# Patient Record
Sex: Male | Born: 1965 | Race: Black or African American | Hispanic: No | Marital: Single | Smoking: Former smoker
Health system: Southern US, Community
[De-identification: ages and names within clinical notes are randomized; demographics above are authoritative.]

## PROBLEM LIST (undated history)

## (undated) DIAGNOSIS — E119 Type 2 diabetes mellitus without complications: Secondary | ICD-10-CM

## (undated) DIAGNOSIS — R519 Headache, unspecified: Secondary | ICD-10-CM

## (undated) DIAGNOSIS — J45909 Unspecified asthma, uncomplicated: Secondary | ICD-10-CM

## (undated) DIAGNOSIS — E7800 Pure hypercholesterolemia, unspecified: Secondary | ICD-10-CM

## (undated) DIAGNOSIS — J40 Bronchitis, not specified as acute or chronic: Secondary | ICD-10-CM

## (undated) DIAGNOSIS — I2510 Atherosclerotic heart disease of native coronary artery without angina pectoris: Secondary | ICD-10-CM

## (undated) DIAGNOSIS — I10 Essential (primary) hypertension: Secondary | ICD-10-CM

## (undated) DIAGNOSIS — R51 Headache: Secondary | ICD-10-CM

## (undated) DIAGNOSIS — J449 Chronic obstructive pulmonary disease, unspecified: Secondary | ICD-10-CM

## (undated) HISTORY — PX: NO PAST SURGERIES: SHX2092

---

## 2002-04-18 ENCOUNTER — Ambulatory Visit (HOSPITAL_COMMUNITY): Admission: RE | Admit: 2002-04-18 | Discharge: 2002-04-18 | Payer: Self-pay | Admitting: Otolaryngology

## 2002-04-18 ENCOUNTER — Encounter: Payer: Self-pay | Admitting: Otolaryngology

## 2002-09-17 ENCOUNTER — Emergency Department (HOSPITAL_COMMUNITY): Admission: EM | Admit: 2002-09-17 | Discharge: 2002-09-18 | Payer: Self-pay | Admitting: Emergency Medicine

## 2002-09-17 ENCOUNTER — Encounter: Payer: Self-pay | Admitting: Emergency Medicine

## 2002-09-18 ENCOUNTER — Encounter: Payer: Self-pay | Admitting: Emergency Medicine

## 2003-09-29 ENCOUNTER — Inpatient Hospital Stay (HOSPITAL_COMMUNITY): Admission: EM | Admit: 2003-09-29 | Discharge: 2003-10-03 | Payer: Self-pay | Admitting: Emergency Medicine

## 2005-10-27 ENCOUNTER — Emergency Department: Payer: Self-pay | Admitting: Emergency Medicine

## 2006-01-12 ENCOUNTER — Ambulatory Visit: Payer: Self-pay | Admitting: Specialist

## 2006-01-26 ENCOUNTER — Ambulatory Visit: Payer: Self-pay | Admitting: Specialist

## 2006-04-04 ENCOUNTER — Encounter: Payer: Self-pay | Admitting: Specialist

## 2006-04-05 ENCOUNTER — Ambulatory Visit: Admission: RE | Admit: 2006-04-05 | Discharge: 2006-04-05 | Payer: Self-pay | Admitting: *Deleted

## 2006-04-05 ENCOUNTER — Ambulatory Visit: Payer: Self-pay | Admitting: Pulmonary Disease

## 2006-04-11 ENCOUNTER — Emergency Department: Payer: Self-pay | Admitting: Emergency Medicine

## 2006-04-24 ENCOUNTER — Encounter: Payer: Self-pay | Admitting: Specialist

## 2009-03-17 ENCOUNTER — Emergency Department (HOSPITAL_COMMUNITY): Admission: EM | Admit: 2009-03-17 | Discharge: 2009-03-17 | Payer: Self-pay | Admitting: Emergency Medicine

## 2011-02-02 LAB — BASIC METABOLIC PANEL
BUN: 5 mg/dL — ABNORMAL LOW (ref 6–23)
CO2: 29 mEq/L (ref 19–32)
Calcium: 8.8 mg/dL (ref 8.4–10.5)
Chloride: 103 mEq/L (ref 96–112)
Creatinine, Ser: 0.95 mg/dL (ref 0.4–1.5)
GFR calc Af Amer: >60 mL/min (ref >60)
GFR calc non Af Amer: >60 mL/min (ref >60)
Glucose, Bld: 167 mg/dL — ABNORMAL HIGH (ref 70–99)
Potassium: 2.8 mEq/L — ABNORMAL LOW (ref 3.5–5.1)
Sodium: 139 mEq/L (ref 135–145)

## 2011-02-02 LAB — POCT CARDIAC MARKERS
CKMB, poc: <1 ng/mL — ABNORMAL LOW (ref 1.0–8.0)
CKMB, poc: <1 ng/mL — ABNORMAL LOW (ref 1.0–8.0)
Myoglobin, poc: 37.5 ng/mL (ref 12–200)
Myoglobin, poc: 37.6 ng/mL (ref 12–200)
Troponin i, poc: <0.05 ng/mL (ref 0.00–0.09)

## 2011-02-02 LAB — CBC
HCT: 39.3 % (ref 39.0–52.0)
Hemoglobin: 13.8 g/dL (ref 13.0–17.0)
MCV: 92.3 fL (ref 78.0–100.0)
RBC: 4.26 MIL/uL (ref 4.22–5.81)
WBC: 5.2 10*3/uL (ref 4.0–10.5)

## 2011-03-12 NOTE — Procedures (Signed)
NAME:  Cody Shepard, Cody Shepard NO.:  [id number]   MEDICAL RECORD NO.:  [id number]                   PATIENT TYPE:  INP   LOCATION:  A222                                 FACILITY:  [facility]   PHYSICIAN:  Vida Roller, M.D.                DATE OF BIRTH:  Jun 14, 1966   DATE OF PROCEDURE:  09/30/2003  DATE OF DISCHARGE:                                    STRESS TEST   TAPE NUMBER:  SE - [id number]   TAPE COUNT:  2204 - [id number]   INDICATIONS FOR PROCEDURE:  This is a 45 year old guy with chest pain, for  evaluation. Multiple cardiac risk factors of hypertension, diabetes, and  hyperlipidemia as well as active tobacco abuse who presents with chest  discomfort, ruled out for cardiac enzymes and now presents for evaluation  for the chest discomfort.   DESCRIPTION OF PROCEDURE:  The patient was brought to the echocardiographic  lab where he was placed in the left lateral decubitus position.  Echocardiographic images were obtained in the parasternal long axes,  parasternal short axes, apical 4 and apical 2 chamber views. The patient was  then exercised in a Bruce protocol and when he had attained at least 85% of  his maximum predicted heart rate for his age, he was brought back  immediately to the echocardiographic suite, where echocardiographic images  were obtained in the same views. These views were then compared, stress  versus rest, for wall motion abnormalities.   STRESS TEST RESULTS:  The patient exercised 7 minutes and 15 seconds of a  Bruce protocol, attaining of 90% of his maximum predicted heart rate for his  age and 10 minutes of exercise. His heart rate increased from 108 beats per  minute to 163 beats per minute, which is 90% of his maximum predicted heart  rate. His blood pressure went from 102 over 62 to 168 over 72, giving a  double product of 24,000. He stopped exercising secondary to shortness of  breath and fatigue. He had no STT wave changes concerning for  ischemia. Had  a few premature ventricular contractions.   ECHOCARDIOGRAPHIC RESULTS:  Rest images show normal left ventricular size  and systolic function with no wall motion abnormalities. Stress images  reveal normal left ventricular size with appropriate recruitment of systolic  function. There is, however, evidence of inferolateral hypokinesis, which is  relatively severe and is reproduced on multiple images, consistent with  inferolateral ischemia.   RECOMMENDATIONS:  Invasive testing should be performed.      ___________________________________________                                            Vida Roller, M.D.   JH/MEDQ  D:  09/30/2003  T:  09/30/2003  Job:  [id number]  cc:   Hanley Hays. Dechurch, M.D.  [address]  [place]  [place] [address]  Fax: [phone]

## 2011-03-12 NOTE — Consult Note (Signed)
NAME:  Cody Shepard, Cody Shepard NO.:  [id number]   MEDICAL RECORD NO.:  [id number]                   PATIENT TYPE:  INP   LOCATION:  A222                                 FACILITY:  [facility]   PHYSICIAN:  Vida Roller, M.D.                DATE OF BIRTH:  [birth year]   DATE OF CONSULTATION:  DATE OF DISCHARGE:                                   CONSULTATION   HISTORY OF PRESENT ILLNESS:  Cody Shepard is a 45 year old African American  male who has a history of hypertension, hyperlipidemia and diabetes mellitus  who presents to the emergency department complaining of left anterior chest  pain at about a 5-10 minute episode localized to the left chest radiating  towards the shoulder.  No associated shortness of breath or diaphoresis.  He  presented to the ER and was given three sublingual nitroglycerins.  The pain  resolved.  There was one episode of recurrent chest pain after admission  which responded to sublingual nitroglycerin and morphine sulfate.   PAST MEDICAL HISTORY:  1. Hypertension.  2. Diabetes mellitus which is diet and medication controlled.  3. Hyperlipidemia.  4. History of enlarged heart and was evaluated by cardiologist in     [place], [place] a number of years ago.  He had a stress test     at that time which was reported as normal.  We have no records of that     evaluation.   MEDICATIONS:  1. Aciphex 20 mg daily.  2. Lipitor 10 mg daily.  3. Allegra D.  4. Glucophage 500 mg twice a day.  5. In the hospital he is on:     A. Aspirin.     B. Imdur 30 mg daily.     C. Protonix 40 mg daily.     D. Zocor 20 mg q.h.s.     E. Normal saline infusion.   SOCIAL HISTORY:  He lives in [place] with his friends.  He is a  heavy Arboriculturist.  He is single.  He has one child on the way.  He  previously smoked cigarettes but quit eight years ago.  Does not drink any  alcohol.   FAMILY HISTORY:  Mother is alive and well at age 75  with no coronary  disease.  Father died at age 39 of a stroke.  He has one sister who is  healthy and about his age.   REVIEW OF SYSTEMS:  Otherwise, noncontributory.   ALLERGIES:  Not allergic to any medication and does not have a problem with  contrast.   PHYSICAL EXAMINATION:  GENERAL APPEARANCE:  Well-developed, well-nourished  African American male in no apparent distress.  Alert and oriented times  four.  VITAL SIGNS:  Pulse 82, respirations 20, blood pressure 112/78.  HEENT:  Unremarkable.  NECK:  Supple with no jugular venous distention or carotid bruits.  CHEST:  Clear to auscultation.  CARDIAC:  Nondisplaced point of maximum impulse with no lifts or thrills.  First and second heart sound are normal.  There is no third or fourth heart  sound.  There is a 2/6 holosystolic murmur heard at the left sternal border.  LUNGS:  Clear to auscultation.  ABDOMEN:  Soft, nontender, normoactive bowel sounds.  GU/RECTAL:  Deferred.  EXTREMITIES:  Without cyanosis, clubbing or edema.  He has 2+ pulses  throughout.  There are no bruits.  MUSCULOSKELETAL:  Without deformities.  NEUROLOGICAL:  Nonfocal.   CHEST X-RAY:  Chest x-ray is no acute disease.   ELECTROCARDIOGRAM:  Sinus rhythm with mild left axis deviation, normal  intervals.  No Q waves.  Nonspecific ST-T wave changes, but he has left  ventricular hypertrophy.   ECHOCARDIOGRAM:  A stress echocardiogram was performed which showed evidence  of inferior lateral ischemia with normal left ventricular systolic function.   LABORATORY DATA:  White blood cell count 5.3, H&H 14 and 44 with platelet  count of 292.  Sodium 137, potassium 3.6, chloride 106, bicarbonate 27, BUN  13, creatinine 1.8, blood sugar 117.  Liver function studies normal.  Albumin 3.6, total protein 6.7.  Two sets of cardiac enzymes are not  consistent with acute myocardial infarction.   IMPRESSION/PLAN:  So, I have a gentleman with atypical chest discomfort  but  an abnormal stress echocardiogram concerning for ischemia who has multiple  cardiac risk factors.  His enzymes are negative times two.  His blood  pressure is well controlled as well as his diabetes and hyperlipidemia at  this point.  I think it is probably reasonable to refer him down to [facility] for invasive assessment as I am concerned for significant  coronary artery disease, and I will arrange that via [facility].      ___________________________________________                                            Vida Roller, M.D.   JH/MEDQ  D:  09/30/2003  T:  10/01/2003  Job:  [id number]

## 2011-03-12 NOTE — H&P (Signed)
NAME:  Cody Shepard, MENKEN.:  [id number]   MEDICAL RECORD NO.:  [id number]                   PATIENT TYPE:  EMS   LOCATION:  ED                                   FACILITY:  [facility]   PHYSICIAN:  Melvyn Novas, MD        DATE OF BIRTH:  25-Oct-1966   DATE OF ADMISSION:  09/29/2003  DATE OF DISCHARGE:                                HISTORY & PHYSICAL   The patient is a 45 year old black male with a history of hyperlipidemia,  hypertension, and diabetes of 2 years.  He complains of feeling bad all week  which is why he came to the ER.  He had generalized headache. He also noted  some left pectoral localized pain which was nonexertional occurring mostly  at night.  The patient denies any orthopnea, PND, or any dyspnea on exertion  or diaphoresis.  The patient was given 3 sublingual nitroglycerin which  significantly relieved his left pectoral pain.  The patient has some slow  mentation and is a questionably reliable historian with multiple risk  factors for coronary disease; and is therefore admitted for functional  evaluation and possible Cardiolite scan.   PAST MEDICAL HISTORY:  Past medical history is significant for hypertension,  type 2 diabetes for 2 years and hyperlipidemia.   PAST SURGICAL HISTORY:  Past surgical history is unremarkable.   ALLERGIES:  No known allergies.   CURRENT MEDICATIONS:  1. Aciphex 20 a day.  2. Lipitor 10 a day.  3. Allegra D.   SOCIAL HISTORY:  He is a nonsmoker.  He has 1 child do in 2 months.  He has  a heavy Arboriculturist.   PHYSICAL EXAMINATION:  VITAL SIGNS:  Blood pressure is 114/72, pulse is 80  and regular.  Respiratory rate is 60.  He is afebrile.  HEENT:  Head normocephalic, atraumatic.  Eyes PERLA.  Extraocular movements  intact.  Sclerae are clear.  Conjunctivae pink.  NECK:  Neck shows no jugular venous distention, no carotid bruits, no  thyromegaly, no thyroid bruits.  Throat shows no  erythema, no exudates.  LUNGS:  Lungs are clear to A&P.  No rales, wheezes or rhonchi.  HEART:  Regular rhythm.  No murmurs, gallops, heaves, thrills or rubs.  ABDOMEN:  Soft, nontender.  Bowel sounds are normoactive without rebound,  masses or organomegaly.  EXTREMITIES:  No clubbing, cyanosis, or edema.  NEUROLOGIC:  Cranial nerves II-XII grossly intact.  Patient moves all 4  extremities.  Plantars are downgoing.   IMPRESSION:  1. This follows left pectoral pain atypical in nature.  2. Multiple risk factors including diabetes, hypertension, and     hyperlipidemia.  3. Slow mentation, questionable reliability of history.   PLAN:  The plan is to admit place him on antiplatelet therapy, aspirin,  empiric nitrates, Imdur 30 a day, get hemoglobin A1c, lipid profile, TSH and  cardiology consultation for possible Cardiolite scan due to his  multiple  risk factors and I will make further recommendations as the data base  expands.     ___________________________________________                                         Melvyn Novas, MD   RMD/MEDQ  D:  09/29/2003  T:  09/30/2003  Job:  [id number]

## 2011-03-12 NOTE — Op Note (Signed)
NAME:  Cody Shepard, Cody Shepard NO.:  [id number]   MEDICAL RECORD NO.:  [id number]          PATIENT TYPE:  OUT   LOCATION:  CARD                         [facility]AC[facility]:  [facility]   PHYSICIAN:  Oley Balm. Sung Amabile, M.D. [facility] OF BIRTH:  October 22, 1966   DATE OF PROCEDURE:  04/05/2006  DATE OF DISCHARGE:  04/05/2006                                 OPERATIVE REPORT   INDICATION FOR TESTING:  Exertional dyspnea.   PROCEDURE:  Cardiopulmonary stress testing was performed on a graded  treadmill.  Testing was stopped due to chest pain.  Effort was suboptimal.  At peak exercise, oxygen uptake was 1.43 liters per minute or 50% of  predicted maximum, indicating moderate exercise impairment.   At peak exercise, heart rate was 120 beats per minute or 67% of predicted  maximum, indicating that cardiovascular reserve remained.  Oxygen pulse was  low normal, possibly suggesting decreased stroke volume.  Blood pressure  response was abnormal, in that there was an excessive hypertensive response  early on during testing.  EKG tracings revealed no definite ischemic  changes.  There were no arrhythmias noted.   At peak exercise, minute ventilation was 42.7 liters per minute or 54% of  maximum voluntary ventilation, indicating that ventilatory reserve remained.  Gas exchange parameters revealed no abnormalities.  Baseline spirometry  revealed moderate flow limitation with a mixed obstruction and restriction  pattern.  I believe that obstruction probably predominates though lung  volumes were not performed to confirm this.  Postexercise spirometry  revealed no exercise-induced bronchospasm.   SUMMARY:  Suboptimal testing.  Moderate exercise impairment.  Testing was  stopped due to chest pain.  Decreased oxygen pulse and increased blood  pressure response suggest that his limitation is a cardiovascular  limitation.  No ischemic changes were noted on EKG but one must consider  ischemic heart disease with  this result if this has not already been  evaluated.  Also noted is moderate flow limitation on spirometry which  appears to be predominantly obstruction greater than restriction.  Evaluation could include a trial of bronchodilator therapy versus pulmonary  consultation.           ______________________________  Oley Balm Sung Amabile, M.D. [facility]     DBS/MEDQ  D:  04/25/2006  T:  04/25/2006  Job:  [id number]   cc:   Jeanella Flattery  Fax: [phone]   Cardiopulmonary Department

## 2011-03-12 NOTE — Cardiovascular Report (Signed)
NAME:  URBAN, NAVAL NO.:  [id number]   MEDICAL RECORD NO.:  [id number]                   PATIENT TYPE:  INP   LOCATION:  3731                                 FACILITY:  [facility]   PHYSICIAN:  Salvadore Farber, M.D.             DATE OF BIRTH:  10-27-1965   DATE OF PROCEDURE:  10/02/2003  DATE OF DISCHARGE:                              CARDIAC CATHETERIZATION   PROCEDURE:  Left heart catheterization, left ventriculography, coronary  angiography.   INDICATIONS:  Mr. Haile is a 45 year old gentleman with coronary artery  disease diagnosed at cardiac catheterization performed in [place] two  years ago.  He now presents with chest discomfort.  Dobutamine  echocardiogram demonstrated inducible inferolateral wall motion abnormality.  He was therefore referred for catheterization.   PROCEDURAL TECHNIQUE:  Informed consent was obtained.  Under 1% lidocaine  local anesthesia a 6-French sheath was placed in the right femoral artery  using the modified Seldinger technique.  Diagnostic angiography and  ventriculography were performed using JL4, JR4, and pigtail catheters.  The  patient tolerated the procedure well and was transferred to the holding room  in stable condition.  Sheaths are to be removed there.   COMPLICATIONS:  None.   FINDINGS:  1. LV 129/9/14.  EF 60% without regional wall motion abnormality.  2. No aortic stenosis or mitral regurgitation.  3. Left main:  Angiographically normal.  4. LAD:  The LAD is a very large vessel which wraps around the apex of the     heart and gives rise to a single large diagonal.  It is angiographically     normal.  5. Circumflex:  The circumflex is a moderate sized vessel giving rise to a     single large obtuse marginal.  The distal circumflex has a 20% stenosis.  6. RCA:  The RCA is a moderate sized, dominant vessel.  There is a 30%     stenosis in the mid vessel.   IMPRESSION/RECOMMENDATIONS:  The patient  has minimal coronary disease.  Suggest noncardiac etiology to his chest discomfort and a false positive  dobutamine echocardiogram.  Will continue preventative efforts with aspirin  and Statin.  Since he has nonobstructive coronary disease, will discontinue  the chronic nitrates with which he has been treated.                                               Salvadore Farber, M.D.    WED/MEDQ  D:  10/02/2003  T:  10/02/2003  Job:  [id number]   cc:   Vida Roller, M.D.  Fax: [phone]   Laveda Abbe

## 2011-03-12 NOTE — Discharge Summary (Signed)
NAME:  Cody Shepard, Cody Shepard NO.:  [id number]   MEDICAL RECORD NO.:  [id number]                   PATIENT TYPE:  INP   LOCATION:  3731                                 FACILITY:  [facility]   PHYSICIAN:  Vida Roller, M.D.                DATE OF BIRTH:  [birth year]   DATE OF ADMISSION:  10/01/2003  DATE OF DISCHARGE:  10/03/2003                                 DISCHARGE SUMMARY   DISCHARGE DIAGNOSES:  1. Known nonobstructive coronary artery disease.  2. Glucose intolerance.  3. Right groin hematoma stable.   HOSPITAL COURSE:  Cody Shepard is a 45 year old male patient who has been  feeling poorly over the past week prior to admission.  He began having left  anterior chest pain while at a funeral on the day prior to admission.  The  episode lasted 5 to 10 minutes at a time.  Eventually he did present to the  emergency room for evaluation at [facility].  His stress echo  revealed evidence of inferolateral ischemia and he was sent to [facility]  for cardiac catheterization.  His cath on October 02, 2003 revealed a  nonobstructive coronary artery disease with a 30% mid right lesion and a 28%  mid circumflex lesion.  At that point he needs to continue secondary  prevention with aspirin plus a statin.   Post cath he did have some right groin swelling and some tenderness.  We did  do an ultrasound.  There was no evidence of a pseudoaneurysm or AV fistula.  There was a small hematoma measuring 1.62 x 0.76 cm.  We did watch him  overnight and he was ambulating without problems and he was ready for  discharge to home the next day.   LABORATORY STUDIES:  Lab studies during his hospital stay included sodium  137, potassium 3.7, BUN 9, creatinine 1.1, glucose 117.  Hemoglobin 11.9,  hematocrit 35.2, white count 4.9, platelets 239.  Total cholesterol 133,  triglycerides 93, HDL 39, LDL 75.  Hemoglobin A1C was 6.5 indicating glucose  intolerance.  Apparently the  patient is on Metformin at home and I have  asked him to stop this until Saturday, October 05, 2003.   He is to resume his home medications which include Aciphex 20 mg a day,  Lipitor 10 mg a day, Allegra daily, and Glucophage as instructed.  May use  Tylenol one to two tablets every six hours if needed for pain.  No strenuous  activity, or driving, or heavy lifting until two days and then he will  gradually increase his activity, clean over his cath site with soap and  water, no scrubbing.  Call for questions or concerns.  He is to follow up at  Dr. Marchelle Folks office on October 11, 2003 at 1:45 p.m.  At this point, he  will need to have a groin check and to have a  CBC to assure that his  hemoglobin has remained stable.      Guy Franco, P.A. [facility]                      Vida Roller, M.D.    LB/MEDQ  D:  10/03/2003  T:  10/04/2003  Job:  [id number]   cc:   [facility], [place]

## 2011-03-12 NOTE — Procedures (Signed)
NAME:  Cody Shepard, Cody Shepard NO.:  [id number]   MEDICAL RECORD NO.:  [id number]                   PATIENT TYPE:  INP   LOCATION:  A222                                 FACILITY:  [facility]   PHYSICIAN:  Vida Roller, M.D.                DATE OF BIRTH:  June 18, 1966   DATE OF PROCEDURE:  DATE OF DISCHARGE:                                    STRESS TEST   INDICATION:  The patient is a 45 year old male with no known coronary artery  disease who presented with atypical chest discomfort.   CARDIAC RISK FACTORS:  1. Hypertension.  2. Diabetes.  3. Hyperlipidemia.  4. History of tobacco abuse.   His cardiac enzymes were negative x2 for acute myocardial infarction.   BASELINE DATA:  Initial echocardiogram is pending M.D. review.   Baseline EKG revealed sinus rhythm at 70 beats per minute with nonspecific  ST abnormalities.  Blood pressure is 102/62.   The patient exercised for a total of 7 minutes, 15 seconds to Bruce protocol  stage 3, and 10.1 mets.  Maximum heart rate was 163 beats per minute which  is 90% of predicted maximum.  Maximum blood pressure was 168/72.  The  patient reported some shortness of breath at the end of exercise.  However,  no chest pain was noted.  EKG revealed no ischemic changes, and few PVC's.  The test was stopped secondary to fatigue.   Final echocardiogram was performed.  The results of this are pending M.D.  review.     ________________________________________  ___________________________________________  Jae Dire, P.A. [facility]                      Vida Roller, M.D.   AB/MEDQ  D:  09/30/2003  T:  10/01/2003  Job:  [id number]

## 2013-01-04 ENCOUNTER — Emergency Department (HOSPITAL_COMMUNITY): Payer: Medicare Other

## 2013-01-04 ENCOUNTER — Encounter (HOSPITAL_COMMUNITY): Payer: Self-pay | Admitting: Emergency Medicine

## 2013-01-04 ENCOUNTER — Emergency Department (HOSPITAL_COMMUNITY)
Admission: EM | Admit: 2013-01-04 | Discharge: 2013-01-05 | Disposition: A | Discharge status: Home / Self Care | Payer: Medicare Other | Attending: Emergency Medicine | Admitting: Emergency Medicine

## 2013-01-04 DIAGNOSIS — I10 Essential (primary) hypertension: Secondary | ICD-10-CM | POA: Insufficient documentation

## 2013-01-04 DIAGNOSIS — Z87891 Personal history of nicotine dependence: Secondary | ICD-10-CM | POA: Insufficient documentation

## 2013-01-04 DIAGNOSIS — R1110 Vomiting, unspecified: Secondary | ICD-10-CM | POA: Insufficient documentation

## 2013-01-04 DIAGNOSIS — Z7902 Long term (current) use of antithrombotics/antiplatelets: Secondary | ICD-10-CM | POA: Insufficient documentation

## 2013-01-04 DIAGNOSIS — E119 Type 2 diabetes mellitus without complications: Secondary | ICD-10-CM | POA: Insufficient documentation

## 2013-01-04 DIAGNOSIS — IMO0002 Reserved for concepts with insufficient information to code with codable children: Secondary | ICD-10-CM | POA: Insufficient documentation

## 2013-01-04 DIAGNOSIS — J4489 Other specified chronic obstructive pulmonary disease: Secondary | ICD-10-CM | POA: Insufficient documentation

## 2013-01-04 DIAGNOSIS — R51 Headache: Secondary | ICD-10-CM | POA: Insufficient documentation

## 2013-01-04 DIAGNOSIS — I2510 Atherosclerotic heart disease of native coronary artery without angina pectoris: Secondary | ICD-10-CM | POA: Insufficient documentation

## 2013-01-04 DIAGNOSIS — E7800 Pure hypercholesterolemia, unspecified: Secondary | ICD-10-CM | POA: Insufficient documentation

## 2013-01-04 DIAGNOSIS — R0602 Shortness of breath: Secondary | ICD-10-CM | POA: Insufficient documentation

## 2013-01-04 DIAGNOSIS — Z7982 Long term (current) use of aspirin: Secondary | ICD-10-CM | POA: Insufficient documentation

## 2013-01-04 DIAGNOSIS — R0789 Other chest pain: Secondary | ICD-10-CM | POA: Insufficient documentation

## 2013-01-04 DIAGNOSIS — Z79899 Other long term (current) drug therapy: Secondary | ICD-10-CM | POA: Insufficient documentation

## 2013-01-04 HISTORY — DX: Type 2 diabetes mellitus without complications: E11.9

## 2013-01-04 HISTORY — DX: Unspecified asthma, uncomplicated: J45.909

## 2013-01-04 HISTORY — DX: Bronchitis, not specified as acute or chronic: J40

## 2013-01-04 HISTORY — DX: Chronic obstructive pulmonary disease, unspecified: J44.9

## 2013-01-04 HISTORY — DX: Atherosclerotic heart disease of native coronary artery without angina pectoris: I25.10

## 2013-01-04 HISTORY — DX: Pure hypercholesterolemia, unspecified: E78.00

## 2013-01-04 HISTORY — DX: Essential (primary) hypertension: I10

## 2013-01-04 MED ORDER — SODIUM CHLORIDE 0.9 % IV SOLN
Freq: Once | INTRAVENOUS | Status: AC
  Administered 2013-01-04: 1000 mL via INTRAVENOUS

## 2013-01-04 MED ORDER — METOCLOPRAMIDE HCL 5 MG/ML IJ SOLN
10.0000 mg | Freq: Once | INTRAMUSCULAR | Status: AC
  Administered 2013-01-04: 10 mg via INTRAVENOUS
  Filled 2013-01-04: qty 2

## 2013-01-04 MED ORDER — DIPHENHYDRAMINE HCL 50 MG/ML IJ SOLN
25.0000 mg | Freq: Once | INTRAMUSCULAR | Status: AC
  Administered 2013-01-04: 25 mg via INTRAVENOUS
  Filled 2013-01-04: qty 1

## 2013-01-04 NOTE — ED Notes (Signed)
Patient c/o headache since Sunday.  Patient was seen by PMD yesterday and was given a shot and states he doesn't feel any better. Patient states he vomited yesterday, but not since then.

## 2013-01-04 NOTE — ED Provider Notes (Signed)
History    This chart was scribed for Ward Givens, MD by Leone Payor, ED Scribe. This patient was seen in room APA03/AP[facility]03 and the patient's care was started 10:08 PM.   CSN: [id number]  Arrival date & time 01/04/13  1927   First MD Initiated Contact with Patient 01/04/13 2200      Chief Complaint  Patient presents with  . Headache     The history is provided by the patient and the spouse. No language interpreter was used.    Cody Shepard is a 47 y.o. male who presents to the Emergency Department complaining of an ongoing, all over HA beginning 4 days ago. States he woke up in the morning and had a mild headache that got progressively worse. Pt describes the pain as sharp and feels like a hammer pounding. Pt was seen by PCP yesterday and was given an injection and his HA went away but returned last night at midnight about 8 hours after the injection. He has associated vomiting (x1 yesterday), SOB, off and on chest pain to the center chest that lasts 5-10 minutes with no triggers that he's had for years. Patient states his cardiologist in [place] is aware of the pain and told him to exercise to make the pain go away. He denies blurred, double vision, neck pain, past similar HAs, cough, sneezing, runny nose. Pt has a family h/o HAs in his sister.   Pt has electric heat. Denies any recent stress.   Pt has h/o HTN, CHF, COPD, asthma, DM.  PCP [facility].    Past Medical History  Diagnosis Date  . Hypertension   . Coronary artery disease   . High cholesterol   . COPD (chronic obstructive pulmonary disease)   . Asthma   . Bronchitis   . Diabetes mellitus without complication     History reviewed. No pertinent past surgical history.  No family history on file.  History  Substance Use Topics  . Smoking status: Former Games developer  . Smokeless tobacco: Not on file  . Alcohol Use: No   On disability for "bad heart"   Review of Systems  HENT: Negative for rhinorrhea,  sneezing and neck pain.   Eyes: Negative for visual disturbance.  Respiratory: Negative for cough.   Gastrointestinal: Positive for vomiting.  Neurological: Positive for headaches.  All other systems reviewed and are negative.    Allergies  Review of patient's allergies indicates no known allergies.  Home Medications   Current Outpatient Rx  Name  Route  Sig  Dispense  Refill  . albuterol (PROVENTIL HFA;VENTOLIN HFA) 108 (90 BASE) MCG/ACT inhaler   Inhalation   Inhale 1-2 puffs into the lungs every 6 (six) hours as needed for wheezing or shortness of breath.         Marland Kitchen amLODipine (NORVASC) 10 MG tablet   Oral   Take 10 mg by mouth daily.         Marland Kitchen aspirin EC 81 MG tablet   Oral   Take 81 mg by mouth daily.         Marland Kitchen atorvastatin (LIPITOR) 20 MG tablet   Oral   Take 20 mg by mouth at bedtime.         . carvedilol (COREG) 25 MG tablet   Oral   Take 25 mg by mouth 2 (two) times daily with a meal.         . clopidogrel (PLAVIX) 75 MG tablet   Oral  Take 75 mg by mouth daily.         . fluticasone (FLONASE) 50 MCG/ACT nasal spray   Nasal   Place 2 sprays into the nose daily.         . isosorbide mononitrate (IMDUR) 30 MG 24 hr tablet   Oral   Take 30 mg by mouth daily.         . metFORMIN (GLUCOPHAGE) 500 MG tablet   Oral   Take 500 mg by mouth 2 (two) times daily with a meal.         . montelukast (SINGULAIR) 10 MG tablet   Oral   Take 10 mg by mouth daily.         Marland Kitchen omeprazole (PRILOSEC) 20 MG capsule   Oral   Take 20 mg by mouth daily.         . valsartan-hydrochlorothiazide (DIOVAN-HCT) 160-25 MG per tablet   Oral   Take 1 tablet by mouth daily.           BP 187/93  Pulse 87  Temp(Src) 97.6 F (36.4 C) (Oral)  Resp 20  Ht 5\' 9"  (1.753 m)  Wt 202 lb (91.627 kg)  BMI 29.82 kg/m2  SpO2 98%  Vital signs normal except hypertension   Physical Exam  Constitutional: He is oriented to person, place, and time. He appears  well-developed and well-nourished.  Non-toxic appearance. He does not appear ill. No distress.  HENT:  Head: Normocephalic and atraumatic.  Right Ear: External ear normal.  Left Ear: External ear normal.  Nose: Nose normal. No mucosal edema or rhinorrhea.  Mouth/Throat: Oropharynx is clear and moist and mucous membranes are normal. No dental abscesses or edematous.  Sinuses non tender.   Eyes: Conjunctivae and EOM are normal. Pupils are equal, round, and reactive to light.  Neck: Normal range of motion and full passive range of motion without pain. Neck supple.  Moves his head freely during conversation.   Cardiovascular: Normal rate, regular rhythm and normal heart sounds.  Exam reveals no gallop and no friction rub.   No murmur heard. Pulmonary/Chest: Effort normal and breath sounds normal. No respiratory distress. He has no wheezes. He has no rhonchi. He has no rales. He exhibits no tenderness and no crepitus.  Abdominal: Soft. Normal appearance and bowel sounds are normal. He exhibits no distension. There is no tenderness. There is no rebound and no guarding.  Musculoskeletal: Normal range of motion. He exhibits no edema and no tenderness.  Moves all extremities well.   Neurological: He is alert and oriented to person, place, and time. He has normal strength. No cranial nerve deficit.  Skin: Skin is warm, dry and intact. No rash noted. No erythema. No pallor.  Psychiatric: He has a normal mood and affect. His speech is normal and behavior is normal. His mood appears not anxious.    ED Course  Procedures (including critical care time) Medications  ketorolac (TORADOL) 30 MG/ML injection 30 mg (not administered)  0.9 %  sodium chloride infusion (1,000 mLs Intravenous New Bag/Given 01/04/13 2325)  metoCLOPramide (REGLAN) injection 10 mg (10 mg Intravenous Given 01/04/13 2328)  diphenhydrAMINE (BENADRYL) injection 25 mg (25 mg Intravenous Given 01/04/13 2326)     DIAGNOSTIC  STUDIES: Oxygen Saturation is 98% on room air, normal by my interpretation.    COORDINATION OF CARE: 10:55 PM Discussed treatment plan with pt at bedside and pt agreed to plan.     Headache almost gone at discharge, given IV  toradol.   Ct Head Wo Contrast  01/05/2013  *RADIOLOGY REPORT*  Clinical Data: Headache, facial pain, history hypertension, diabetes, COPD, asthma, coronary artery disease  CT HEAD WITHOUT CONTRAST  Technique:  Contiguous axial images were obtained from the base of the skull through the vertex without contrast.  Comparison: None  Findings: Normal ventricular morphology. No midline shift or mass effect. No intracranial hemorrhage, mass lesion or extra-axial fluid collection. No definite evidence of infarction. Few scattered sites of questionable low white matter attenuation, nonspecific. Bones and sinuses unremarkable.  IMPRESSION: Few questionable areas of white matter hypoattenuation, nonspecific, could represent premature small vessel chronic ischemic change. No definite evidence of hemorrhage or infarction.   Original Report Authenticated By: Ulyses Southward, M.D.      1. Headache     Plan discharge  Devoria Albe, MD, FACEP   MDM    I personally performed the services described in this documentation, which was scribed in my presence. The recorded information has been reviewed and considered.  Devoria Albe, MD, Armando Gang   Ward Givens, MD 01/05/13 [phone]

## 2013-01-04 NOTE — ED Notes (Signed)
Denies nausea or photophobia, currently waiting on CT

## 2013-01-05 MED ORDER — KETOROLAC TROMETHAMINE 30 MG/ML IJ SOLN
30.0000 mg | Freq: Once | INTRAMUSCULAR | Status: AC
  Administered 2013-01-05: 30 mg via INTRAVENOUS
  Filled 2013-01-05: qty 1

## 2018-03-08 ENCOUNTER — Encounter: Payer: Self-pay | Admitting: "Endocrinology

## 2018-03-16 ENCOUNTER — Other Ambulatory Visit: Payer: Self-pay

## 2018-03-16 ENCOUNTER — Emergency Department (HOSPITAL_COMMUNITY)
Admission: EM | Admit: 2018-03-16 | Discharge: 2018-03-16 | Disposition: A | Discharge status: Home / Self Care | Payer: Medicare Other | Attending: Emergency Medicine | Admitting: Emergency Medicine

## 2018-03-16 DIAGNOSIS — Z7902 Long term (current) use of antithrombotics/antiplatelets: Secondary | ICD-10-CM | POA: Insufficient documentation

## 2018-03-16 DIAGNOSIS — Z7984 Long term (current) use of oral hypoglycemic drugs: Secondary | ICD-10-CM | POA: Insufficient documentation

## 2018-03-16 DIAGNOSIS — Z7982 Long term (current) use of aspirin: Secondary | ICD-10-CM | POA: Insufficient documentation

## 2018-03-16 DIAGNOSIS — I2510 Atherosclerotic heart disease of native coronary artery without angina pectoris: Secondary | ICD-10-CM | POA: Diagnosis not present

## 2018-03-16 DIAGNOSIS — R51 Headache: Secondary | ICD-10-CM | POA: Insufficient documentation

## 2018-03-16 DIAGNOSIS — Z87891 Personal history of nicotine dependence: Secondary | ICD-10-CM | POA: Insufficient documentation

## 2018-03-16 DIAGNOSIS — I10 Essential (primary) hypertension: Secondary | ICD-10-CM | POA: Diagnosis not present

## 2018-03-16 DIAGNOSIS — E876 Hypokalemia: Secondary | ICD-10-CM

## 2018-03-16 DIAGNOSIS — E119 Type 2 diabetes mellitus without complications: Secondary | ICD-10-CM | POA: Diagnosis not present

## 2018-03-16 DIAGNOSIS — J449 Chronic obstructive pulmonary disease, unspecified: Secondary | ICD-10-CM | POA: Insufficient documentation

## 2018-03-16 DIAGNOSIS — R519 Headache, unspecified: Secondary | ICD-10-CM

## 2018-03-16 LAB — CBC WITH DIFFERENTIAL/PLATELET
Basophils Absolute: 0 10*3/uL (ref 0.0–0.1)
Basophils Relative: 0 %
EOS ABS: 0.3 10*3/uL (ref 0.0–0.7)
EOS PCT: 7 %
HCT: 38.4 % — ABNORMAL LOW (ref 39.0–52.0)
Hemoglobin: 12.9 g/dL — ABNORMAL LOW (ref 13.0–17.0)
LYMPHS ABS: 1.6 10*3/uL (ref 0.7–4.0)
LYMPHS PCT: 42 %
MCH: 30.5 pg (ref 26.0–34.0)
MCHC: 33.6 g/dL (ref 30.0–36.0)
MCV: 90.8 fL (ref 78.0–100.0)
Monocytes Absolute: 0.5 10*3/uL (ref 0.1–1.0)
Monocytes Relative: 12 %
Neutro Abs: 1.5 10*3/uL — ABNORMAL LOW (ref 1.7–7.7)
Neutrophils Relative %: 39 %
PLATELETS: 221 10*3/uL (ref 150–400)
RBC: 4.23 MIL/uL (ref 4.22–5.81)
RDW: 13.3 % (ref 11.5–15.5)
WBC: 3.7 10*3/uL — ABNORMAL LOW (ref 4.0–10.5)

## 2018-03-16 LAB — BASIC METABOLIC PANEL
Anion gap: 8 (ref 5–15)
BUN: 10 mg/dL (ref 6–20)
CHLORIDE: 102 mmol/L (ref 101–111)
CO2: 29 mmol/L (ref 22–32)
CREATININE: 1.13 mg/dL (ref 0.61–1.24)
Calcium: 9 mg/dL (ref 8.9–10.3)
GFR calc Af Amer: >60 mL/min (ref >60)
GFR calc non Af Amer: >60 mL/min (ref >60)
GLUCOSE: 136 mg/dL — AB (ref 65–99)
POTASSIUM: 2.8 mmol/L — AB (ref 3.5–5.1)
Sodium: 139 mmol/L (ref 135–145)

## 2018-03-16 MED ORDER — KETOROLAC TROMETHAMINE 30 MG/ML IJ SOLN
30.0000 mg | Freq: Once | INTRAMUSCULAR | Status: AC
  Administered 2018-03-16: 30 mg via INTRAVENOUS
  Filled 2018-03-16: qty 1

## 2018-03-16 MED ORDER — METOCLOPRAMIDE HCL 5 MG/ML IJ SOLN
10.0000 mg | Freq: Once | INTRAMUSCULAR | Status: AC
  Administered 2018-03-16: 10 mg via INTRAVENOUS
  Filled 2018-03-16: qty 2

## 2018-03-16 MED ORDER — POTASSIUM CHLORIDE CRYS ER 20 MEQ PO TBCR
40.0000 meq | EXTENDED_RELEASE_TABLET | Freq: Once | ORAL | Status: AC
  Administered 2018-03-16: 40 meq via ORAL
  Filled 2018-03-16: qty 2

## 2018-03-16 MED ORDER — SODIUM CHLORIDE 0.9 % IV BOLUS
500.0000 mL | Freq: Once | INTRAVENOUS | Status: AC
  Administered 2018-03-16: 500 mL via INTRAVENOUS

## 2018-03-16 MED ORDER — DIPHENHYDRAMINE HCL 50 MG/ML IJ SOLN
25.0000 mg | Freq: Once | INTRAMUSCULAR | Status: AC
  Administered 2018-03-16: 25 mg via INTRAVENOUS
  Filled 2018-03-16: qty 1

## 2018-03-16 NOTE — ED Notes (Signed)
Pt took his own BP meds from home as ordered by Dr Lacinda Axon.

## 2018-03-16 NOTE — Discharge Instructions (Addendum)
Take your potassium supplement 4 times a day.  Have your potassium rechecked at your doctor's visit on Tuesday

## 2018-03-16 NOTE — ED Triage Notes (Signed)
Pt states he has been seeing a cardiologist who informed him of low K+ levels, pt has been experiencing headaches for approx.1 week

## 2018-03-16 NOTE — ED Provider Notes (Signed)
[facility] Provider Note   CSN: [id number] Arrival date & time: 03/16/18  6010     History   Chief Complaint Chief Complaint  Patient presents with  . Migraine    HPI Cody Shepard is a 52 y.o. male.  Patient presents with a headache, low potassium.  He is currently on K 20 mEq tid.  No stiff neck, neurological deficits, fever, sweats, chills, chest pain, dyspnea.  He has taken nothing for his headache at home.  Severity of symptoms is mild to moderate.     Past Medical History:  Diagnosis Date  . Asthma   . Bronchitis   . COPD (chronic obstructive pulmonary disease)   . Coronary artery disease   . Diabetes mellitus without complication   . High cholesterol   . Hypertension     There are no active problems to display for this patient.   No past surgical history on file.      Home Medications    Prior to Admission medications   Medication Sig Start Date End Date Taking? Authorizing Provider  albuterol (PROVENTIL HFA;VENTOLIN HFA) 108 (90 BASE) MCG/ACT inhaler Inhale 1-2 puffs into the lungs every 6 (six) hours as needed for wheezing or shortness of breath.   Yes [provider]  amLODipine (NORVASC) 10 MG tablet Take 10 mg by mouth daily.   Yes [provider]  Ascorbic Acid (VITAMIN C) 1000 MG tablet Take 1,000 mg by mouth daily.   Yes [provider]  aspirin EC 81 MG tablet Take 81 mg by mouth daily.   Yes [provider]  atorvastatin (LIPITOR) 20 MG tablet Take 20 mg by mouth at bedtime.   Yes [provider]  carvedilol (COREG) 25 MG tablet Take 25 mg by mouth 2 (two) times daily with a meal.   Yes [provider]  cloNIDine (CATAPRES - DOSED IN MG/24 HR) 0.3 mg/24hr patch Place 0.3 mg onto the skin once a week.   Yes [provider]  clopidogrel (PLAVIX) 75 MG tablet Take 75 mg by mouth daily.   Yes [provider]  colchicine 0.6 MG tablet Take 0.6 mg by mouth  daily.   Yes [provider]  fexofenadine (ALLEGRA) 180 MG tablet Take 180 mg by mouth daily.   Yes [provider]  fluticasone (FLONASE) 50 MCG/ACT nasal spray Place 2 sprays into the nose daily.   Yes [provider]  guaifenesin (HUMIBID E) 400 MG TABS tablet Take 400 mg by mouth 2 (two) times daily as needed.   Yes [provider]  hydrALAZINE (APRESOLINE) 100 MG tablet Take 150 mg by mouth 2 (two) times daily.   Yes [provider]  ibuprofen (ADVIL,MOTRIN) 400 MG tablet Take 1 tablet by mouth 2 (two) times daily as needed. 02/24/18  Yes [provider]  isosorbide mononitrate (IMDUR) 60 MG 24 hr tablet Take 60 mg by mouth daily.    Yes [provider]  metFORMIN (GLUCOPHAGE) 500 MG tablet Take 500 mg by mouth 2 (two) times daily with a meal.   Yes [provider]  montelukast (SINGULAIR) 10 MG tablet Take 10 mg by mouth daily.   Yes [provider]  olmesartan (BENICAR) 40 MG tablet Take 40 mg by mouth daily.   Yes [provider]  omeprazole (PRILOSEC) 20 MG capsule Take 20 mg by mouth 2 (two) times daily before a meal.    Yes [provider]  potassium chloride SA (  K-DUR,KLOR-CON) 20 MEQ tablet Take 20 mEq by mouth 3 (three) times daily.   Yes [provider]  ranolazine (RANEXA) 500 MG 12 hr tablet Take 500 mg by mouth 2 (two) times daily.   Yes [provider]  sucralfate (CARAFATE) 1 g tablet Take 1 g by mouth 4 (four) times daily.   Yes [provider]  TRELEGY ELLIPTA 100-62.5-25 MCG/INH AEPB Inhale 1 puff into the lungs daily. 02/16/18  Yes [provider]    Family History No family history on file.  Social History Social History   Tobacco Use  . Smoking status: Former Smoker  Substance Use Topics  . Alcohol use: No  . Drug use: No     Allergies   Patient has no known allergies.   Review of Systems Review of Systems  All other  systems reviewed and are negative.    Physical Exam Updated Vital Signs BP (!) 150/98 (BP Location: Left Arm)   Pulse 83   Temp 97.9 F (36.6 C) (Oral)   Resp 15   Ht 5\' 9"  (1.753 m)   Wt 93 kg (205 lb)   SpO2 97%   BMI 30.27 kg/m   Physical Exam  Constitutional: He is oriented to person, place, and time. He appears well-developed and well-nourished.  nad  HENT:  Head: Normocephalic and atraumatic.  Eyes: Conjunctivae are normal.  Neck: Neck supple.  Cardiovascular: Normal rate and regular rhythm.  Pulmonary/Chest: Effort normal and breath sounds normal.  Abdominal: Soft. Bowel sounds are normal.  Musculoskeletal: Normal range of motion.  Neurological: He is alert and oriented to person, place, and time.  Skin: Skin is warm and dry.  Psychiatric: He has a normal mood and affect. His behavior is normal.  Nursing note and vitals reviewed.    ED Treatments / Results  Labs (all labs ordered are listed, but only abnormal results are displayed) Labs Reviewed  BASIC METABOLIC PANEL - Abnormal; Notable for the following components:      Result Value   Potassium 2.8 (*)    Glucose, Bld 136 (*)    All other components within normal limits  CBC WITH DIFFERENTIAL/PLATELET - Abnormal; Notable for the following components:   WBC 3.7 (*)    Hemoglobin 12.9 (*)    HCT 38.4 (*)    Neutro Abs 1.5 (*)    All other components within normal limits    EKG None  Radiology No results found.  Procedures Procedures (including critical care time)  Medications Ordered in ED Medications  sodium chloride 0.9 % bolus 500 mL (0 mLs Intravenous Stopped 03/16/18 0850)  metoCLOPramide (REGLAN) injection 10 mg (10 mg Intravenous Given 03/16/18 0810)  ketorolac (TORADOL) 30 MG/ML injection 30 mg (30 mg Intravenous Given 03/16/18 0809)  diphenhydrAMINE (BENADRYL) injection 25 mg (25 mg Intravenous Given 03/16/18 0809)  potassium chloride SA (K-DUR,KLOR-CON) CR tablet 40 mEq (40 mEq Oral  Given 03/16/18 0807)     Initial Impression / Assessment and Plan / ED Course  I have reviewed the triage vital signs and the nursing notes.  Pertinent labs & imaging results that were available during my care of the patient were reviewed by me and considered in my medical decision making (see chart for details).     Patient presents with headache and low potassium.  He is neurologically intact.  Potassium was replaced orally.  IV fluids, IV Reglan, IV Benadryl, IV Toradol.  This treatment helped his headache.  He will increase his potassium  20 mEq QID.  He has a doctor's appointment on Tuesday and will have his potassium rechecked at that time.  Final Clinical Impressions(s) / ED Diagnoses   Final diagnoses:  Intractable headache, unspecified chronicity pattern, unspecified headache type  Hypokalemia    ED Discharge Orders    None       Nat Christen, MD 03/16/18 1236

## 2018-03-29 ENCOUNTER — Encounter: Payer: Self-pay | Admitting: "Endocrinology

## 2018-07-19 ENCOUNTER — Encounter: Payer: Self-pay | Admitting: "Endocrinology

## 2018-07-19 ENCOUNTER — Ambulatory Visit (INDEPENDENT_AMBULATORY_CARE_PROVIDER_SITE_OTHER): Payer: Medicare Other | Admitting: "Endocrinology

## 2018-07-19 ENCOUNTER — Other Ambulatory Visit (HOSPITAL_COMMUNITY)
Admission: RE | Admit: 2018-07-19 | Discharge: 2018-07-19 | Disposition: A | Discharge status: Home / Self Care | Payer: Medicare Other | Source: Ambulatory Visit | Attending: "Endocrinology | Admitting: "Endocrinology

## 2018-07-19 VITALS — BP 156/97 | HR 68 | Ht 69.0 in | Wt 206.0 lb

## 2018-07-19 DIAGNOSIS — I10 Essential (primary) hypertension: Secondary | ICD-10-CM | POA: Diagnosis not present

## 2018-07-19 DIAGNOSIS — E876 Hypokalemia: Secondary | ICD-10-CM | POA: Insufficient documentation

## 2018-07-19 LAB — COMPREHENSIVE METABOLIC PANEL
ALBUMIN: 4.1 g/dL (ref 3.5–5.0)
ALT: 30 U/L (ref 0–44)
ANION GAP: 8 (ref 5–15)
AST: 27 U/L (ref 15–41)
Alkaline Phosphatase: 49 U/L (ref 38–126)
BILIRUBIN TOTAL: 1.1 mg/dL (ref 0.3–1.2)
BUN: 14 mg/dL (ref 6–20)
CHLORIDE: 103 mmol/L (ref 98–111)
CO2: 29 mmol/L (ref 22–32)
Calcium: 8.9 mg/dL (ref 8.9–10.3)
Creatinine, Ser: 1.04 mg/dL (ref 0.61–1.24)
GFR calc Af Amer: >60 mL/min (ref >60)
GFR calc non Af Amer: >60 mL/min (ref >60)
GLUCOSE: 130 mg/dL — AB (ref 70–99)
POTASSIUM: 3 mmol/L — AB (ref 3.5–5.1)
SODIUM: 140 mmol/L (ref 135–145)
TOTAL PROTEIN: 7.6 g/dL (ref 6.5–8.1)

## 2018-07-19 LAB — MAGNESIUM: Magnesium: 1.7 mg/dL (ref 1.7–2.4)

## 2018-07-19 LAB — PHOSPHORUS: Phosphorus: 3.3 mg/dL (ref 2.5–4.6)

## 2018-07-19 MED ORDER — POTASSIUM CHLORIDE CRYS ER 20 MEQ PO TBCR: 40.0000 meq | EXTENDED_RELEASE_TABLET | Freq: Four times a day (QID) | ORAL | 3 refills | Status: DC

## 2018-07-19 NOTE — Progress Notes (Signed)
Endocrinology Consult Note                                            07/19/2018, 6:03 PM   Subjective:    Patient ID: Cody Shepard, male    DOB: 25-Apr-1966, PCP Abran Richard, MD   Past Medical History:  Diagnosis Date  . Asthma   . Bronchitis   . COPD (chronic obstructive pulmonary disease) ([place])   . Coronary artery disease   . Diabetes mellitus without complication ([place])   . High cholesterol   . Hypertension    History reviewed. No pertinent surgical history. Social History   Socioeconomic History  . Marital status: Single    Spouse name: Not on file  . Number of children: Not on file  . Years of education: Not on file  . Highest education level: Not on file  Occupational History  . Not on file  Social Needs  . Financial resource strain: Not on file  . Food insecurity:    Worry: Not on file    Inability: Not on file  . Transportation needs:    Medical: Not on file    Non-medical: Not on file  Tobacco Use  . Smoking status: Former Smoker  Substance and Sexual Activity  . Alcohol use: No  . Drug use: No  . Sexual activity: Not on file  Lifestyle  . Physical activity:    Days per week: Not on file    Minutes per session: Not on file  . Stress: Not on file  Relationships  . Social connections:    Talks on phone: Not on file    Gets together: Not on file    Attends religious service: Not on file    Active member of club or organization: Not on file    Attends meetings of clubs or organizations: Not on file    Relationship status: Not on file  Other Topics Concern  . Not on file  Social History Narrative  . Not on file   Outpatient Encounter Medications as of 07/19/2018  Medication Sig  . albuterol (PROVENTIL HFA;VENTOLIN HFA) 108 (90 BASE) MCG/ACT inhaler Inhale 1-2 puffs into the lungs every 6 (six) hours as needed for wheezing or shortness of breath.  Marland Kitchen amLODipine (NORVASC) 10 MG tablet Take 10 mg by mouth daily.  . Ascorbic Acid (VITAMIN C)  1000 MG tablet Take 1,000 mg by mouth daily.  Marland Kitchen atorvastatin (LIPITOR) 20 MG tablet Take 20 mg by mouth daily.  . carvedilol (COREG) 25 MG tablet Take 25 mg by mouth 2 (two) times daily with a meal.  . cloNIDine (CATAPRES - DOSED IN MG/24 HR) 0.3 mg/24hr patch Place 0.3 mg onto the skin once a week.  . clopidogrel (PLAVIX) 75 MG tablet Take 75 mg by mouth daily.  . fexofenadine (ALLEGRA) 180 MG tablet Take 180 mg by mouth daily.  . hydrALAZINE (APRESOLINE) 100 MG tablet Take 100 mg by mouth as directed.  . isosorbide mononitrate (IMDUR) 60 MG 24 hr tablet Take 60 mg by mouth daily.   . metFORMIN (GLUCOPHAGE) 500 MG tablet Take 500 mg by mouth 2 (two) times daily with a meal.  . montelukast (SINGULAIR) 10 MG tablet Take 10 mg by mouth daily.  . nebivolol (BYSTOLIC) 10 MG tablet Take 10 mg by mouth daily.  Marland Kitchen olmesartan (BENICAR) 40  MG tablet Take 40 mg by mouth daily.  Marland Kitchen omeprazole (PRILOSEC) 20 MG capsule Take 20 mg by mouth 2 (two) times daily before a meal.   . potassium chloride SA (K-DUR,KLOR-CON) 20 MEQ tablet Take 2 tablets (40 mEq total) by mouth 4 (four) times daily.  . ranolazine (RANEXA) 500 MG 12 hr tablet Take 500 mg by mouth 2 (two) times daily.  . sucralfate (CARAFATE) 1 g tablet Take 1 g by mouth 4 (four) times daily.  . [DISCONTINUED] colchicine 0.6 MG tablet Take 0.6 mg by mouth daily.  . [DISCONTINUED] potassium chloride SA (K-DUR,KLOR-CON) 20 MEQ tablet Take 20 mEq by mouth 4 (four) times daily.   . [DISCONTINUED] aspirin EC 81 MG tablet Take 81 mg by mouth daily.  . [DISCONTINUED] atorvastatin (LIPITOR) 20 MG tablet Take 20 mg by mouth at bedtime.  . [DISCONTINUED] colchicine 0.6 MG tablet Take 0.6 mg by mouth daily.  . [DISCONTINUED] fluticasone (FLONASE) 50 MCG/ACT nasal spray Place 2 sprays into the nose daily.  . [DISCONTINUED] guaifenesin (HUMIBID E) 400 MG TABS tablet Take 400 mg by mouth 2 (two) times daily as needed.  . [DISCONTINUED] hydrALAZINE (APRESOLINE) 100 MG  tablet Take 150 mg by mouth 2 (two) times daily.  . [DISCONTINUED] ibuprofen (ADVIL,MOTRIN) 400 MG tablet Take 1 tablet by mouth 2 (two) times daily as needed.  . [DISCONTINUED] TRELEGY ELLIPTA 100-62.5-25 MCG/INH AEPB Inhale 1 puff into the lungs daily.   No facility-administered encounter medications on file as of 07/19/2018.    ALLERGIES: No Known Allergies  VACCINATION STATUS:  There is no immunization history on file for this patient.  HPI Cody Shepard is 52 y.o. male who presents today with a medical history as above. he is being seen in consultation for hypokalemia requested by Abran Richard, MD. -Patient is a poor historian.  He reports that he has known about low potassium only when he was advised on this consultation during his last visit with his  PMD.  However, review of his medical records shows that he had low potassium of 2.8 in May 2010.  He is complete medical records his not available to review.  -He has multiple medical problems including type 2 diabetes on metformin, chronic severe hypertension requiring currently 7 different medications and still not controlled.  He also has hyperlipidemia, coronary artery disease on medical management. -He denies diarrhea, regular use of diuretics.  He is on chlorthalidone 25 mg p.o. daily among his other blood pressure medications.  He follows normal average American diet. -His  labs from Mar 16, 2018 showed potassium of 2.8, normal renal function.  -Stat labs today show potassium of 3, normal renal function. -In June 2019 he underwent labs for plasma aldosterone concentration which was 8, plasma renin activity which was 0.12 giving Aldo/PRA of 66-elevated. -He is on potassium supplement, currently 20 mEq of KDur 4 times a day, reportedly has been the same for the last 6 months.  Patient reports compliance to his medications. -He smoked heavily in the past, not a current smoker. He is not physically  active generally. -Patient denies any  family history of adrenal, thyroid, pituitary, nor parathyroid dysfunctions. -His father was diagnosed with CVA at age of 68, and later died of complications from coronary artery disease.  He denies family history of syndromic kidney dysfunctions.  -He is known to have migraine headaches, currently denies headaches, chest pain, shortness of breath.  He denies any rapid weight change.  No palpitations, diaphoresis, nor fainting spells.  He is referred by his cardiologist for hypokalemia.   Review of Systems  Constitutional: no weight gain/loss, no fatigue, no subjective hyperthermia, no subjective hypothermia Eyes: no blurry vision, no xerophthalmia ENT: no sore throat, no nodules palpated in throat, no dysphagia/odynophagia, no hoarseness Cardiovascular: no Chest Pain, no Shortness of Breath, no palpitations, no leg swelling Respiratory: no cough, no SOB Gastrointestinal: no Nausea/Vomiting/Diarhhea Musculoskeletal: no muscle/joint aches Skin: no rashes Neurological: no tremors, no numbness, no tingling, no dizziness Psychiatric: no depression, no anxiety  Objective:    BP (!) 156/97   Pulse 68   Wt 206 lb (93.4 kg)   BMI 30.42 kg/m   Wt Readings from Last 3 Encounters:  07/19/18 206 lb (93.4 kg)  03/16/18 205 lb (93 kg)  01/04/13 202 lb (91.6 kg)    Physical Exam  Constitutional: + Obese for height,  not in acute distress, normal state of mind Eyes: PERRLA, EOMI, no exophthalmos ENT: moist mucous membranes, no thyromegaly, no cervical lymphadenopathy Cardiovascular: normal precordial activity, Regular Rate and Rhythm, no Murmur/Rubs/Gallops Respiratory:  adequate breathing efforts, no gross chest deformity, Clear to auscultation bilaterally Gastrointestinal: abdomen soft, Non -tender, No distension, Bowel Sounds present Musculoskeletal: no gross deformities, strength intact in all four extremities Skin: moist, warm, no rashes Neurological: no tremor with outstretched hands,  Deep tendon reflexes normal in all four extremities.  Recent Results (from the past 2160 hour(s))  Comprehensive metabolic panel     Status: Abnormal   Collection Time: 07/19/18  2:01 PM  Result Value Ref Range   Sodium 140 135 - 145 mmol/L   Potassium 3.0 (L) 3.5 - 5.1 mmol/L   Chloride 103 98 - 111 mmol/L   CO2 29 22 - 32 mmol/L   Glucose, Bld 130 (H) 70 - 99 mg/dL   BUN 14 6 - 20 mg/dL   Creatinine, Ser 1.04 0.61 - 1.24 mg/dL   Calcium 8.9 8.9 - 10.3 mg/dL   Total Protein 7.6 6.5 - 8.1 g/dL   Albumin 4.1 3.5 - 5.0 g/dL   AST 27 15 - 41 U/L   ALT 30 0 - 44 U/L   Alkaline Phosphatase 49 38 - 126 U/L   Total Bilirubin 1.1 0.3 - 1.2 mg/dL   GFR calc non Af Amer >60 >60 mL/min   GFR calc Af Amer >60 >60 mL/min    Comment: (NOTE) The eGFR has been calculated using the CKD EPI equation. This calculation has not been validated in all clinical situations. eGFR's persistently <60 mL/min signify possible Chronic Kidney Disease.    Anion gap 8 5 - 15    Comment: Performed at [facility], [address] [place], [place] [address]  Magnesium     Status: None   Collection Time: 07/19/18  2:01 PM  Result Value Ref Range   Magnesium 1.7 1.7 - 2.4 mg/dL    Comment: Performed at [facility], [address] [place], [place] [address]  Phosphorus     Status: None   Collection Time: 07/19/18  2:01 PM  Result Value Ref Range   Phosphorus 3.3 2.5 - 4.6 mg/dL    Comment: Performed at [facility], [address], [place], [place] [address]         Assessment & Plan:   1. Hypokalemia-chronic 2.  Severe  hypertension 3.  Type 2 diabetes.-Recent A1c.  He is advised to continue metformin 500 mg p.o. twice daily.   - Cody Shepard  is being seen at a kind request  of Abran Richard, MD. - I have reviewed his available endocrine records and clinically evaluated the patient. - Based on reviews, he has some acute/chronic spontaneous hypokalemia associated with difficult to control  hypertension requiring up to 7 medications.  -Patient is not losing potassium through the GI source, not on diuretics.  He is not a suspect for renin secreting tumor.  However did not rule out renovascular hypertension nor coarctation of the aorta.    -Pretest probability for hyperaldosteronism is high in this patient. -His last PAC/PRA ratio was likely performed when his potassium was still low at 2.8.   -His stat labs today show potassium of 3, patient is asymptomatic.  He likely had potassium lower than 3 for months- years.  He has normal magnesium level. -Per his report he has been on 80 mEq of potassium for the last 6 months. -I advised him to increase his potassium to 40 mEq 4 times a day with plan to repeat CMP in 1 week. -When Potassium approaches normal, he will have repeat labs involving plasma aldosterone concentration, plasma renin activity, PAC/PRA.  -If he is confirmed to have hyperaldosteronism, this would be followed up by confirmatory tests, and imaging studies to locate a source.   -Rare differential diagnosis for hypertension and hypokalemia including Liddle syndrome, congenital adrenal hyperplasia, DOC producing tumor, will be entertained based on his response.   -His blood pressure is still not controlled this morning due to the fact that he missed his morning medications.  I did not make any changes to his blood pressure medications.  I have advised him to be consistent in taking his blood pressure medications per orders.  - I did not initiate any new prescriptions today. -I have emphasized the process of his next appointment to his labs and plan long-term care.   - I advised him  to maintain close follow up with Abran Richard, MD for primary care needs.   - Time spent with the patient: 45 minutes, of which >50% was spent in obtaining information about his symptoms, reviewing his previous labs, evaluations, and treatments, counseling him about his chronic hypokalemia,  severe hypertension, and developing a plan to confirm the diagnosis and long term treatment as necessary.  Cody Shepard participated in the discussions, expressed understanding, and voiced agreement with the above plans.  All questions were answered to his satisfaction. he is encouraged to contact clinic should he have any questions or concerns prior to his return visit.  Follow up plan: Return in about 10 days (around 07/29/2018) for Labs Today- Non-Fasting Ok, Follow up with Pre-visit Labs.   Glade Lloyd, MD [facility] [address] [place], [place] [address] Phone: [phone]  Fax: [phone]     07/19/2018, 6:03 PM  This note was partially dictated with voice recognition software. Similar sounding words can be transcribed inadequately or may not  be corrected upon review.

## 2018-08-04 ENCOUNTER — Other Ambulatory Visit (HOSPITAL_COMMUNITY)
Admission: RE | Admit: 2018-08-04 | Discharge: 2018-08-04 | Disposition: A | Discharge status: Home / Self Care | Payer: Medicare Other | Source: Ambulatory Visit | Attending: "Endocrinology | Admitting: "Endocrinology

## 2018-08-04 DIAGNOSIS — E876 Hypokalemia: Secondary | ICD-10-CM | POA: Diagnosis present

## 2018-08-04 LAB — COMPREHENSIVE METABOLIC PANEL
ALK PHOS: 51 U/L (ref 38–126)
ALT: 26 U/L (ref 0–44)
ANION GAP: 9 (ref 5–15)
AST: 25 U/L (ref 15–41)
Albumin: 3.9 g/dL (ref 3.5–5.0)
BILIRUBIN TOTAL: 0.8 mg/dL (ref 0.3–1.2)
BUN: 10 mg/dL (ref 6–20)
CALCIUM: 8.9 mg/dL (ref 8.9–10.3)
CO2: 26 mmol/L (ref 22–32)
Chloride: 101 mmol/L (ref 98–111)
Creatinine, Ser: 1.16 mg/dL (ref 0.61–1.24)
GFR calc Af Amer: >60 mL/min (ref >60)
GFR calc non Af Amer: >60 mL/min (ref >60)
Glucose, Bld: 226 mg/dL — ABNORMAL HIGH (ref 70–99)
POTASSIUM: 3.6 mmol/L (ref 3.5–5.1)
SODIUM: 136 mmol/L (ref 135–145)
TOTAL PROTEIN: 7.7 g/dL (ref 6.5–8.1)

## 2018-08-08 ENCOUNTER — Ambulatory Visit (INDEPENDENT_AMBULATORY_CARE_PROVIDER_SITE_OTHER): Payer: Medicare Other | Admitting: "Endocrinology

## 2018-08-08 ENCOUNTER — Encounter: Payer: Self-pay | Admitting: "Endocrinology

## 2018-08-08 VITALS — BP 139/83 | HR 76 | Ht 69.0 in | Wt 208.0 lb

## 2018-08-08 DIAGNOSIS — E119 Type 2 diabetes mellitus without complications: Secondary | ICD-10-CM | POA: Diagnosis not present

## 2018-08-08 DIAGNOSIS — I10 Essential (primary) hypertension: Secondary | ICD-10-CM

## 2018-08-08 DIAGNOSIS — E876 Hypokalemia: Secondary | ICD-10-CM | POA: Diagnosis not present

## 2018-08-08 NOTE — Progress Notes (Signed)
Endocrinology follow-up  Note                                            08/08/2018, 3:32 PM   Subjective:    Patient ID: Cody Shepard, male    DOB: [birth year], PCP Cody Richard, MD   Past Medical History:  Diagnosis Date  . Asthma   . Bronchitis   . COPD (chronic obstructive pulmonary disease) ([place])   . Coronary artery disease   . Diabetes mellitus without complication ([place])   . High cholesterol   . Hypertension    History reviewed. No pertinent surgical history. Social History   Socioeconomic History  . Marital status: Single    Spouse name: Not on file  . Number of children: Not on file  . Years of education: Not on file  . Highest education level: Not on file  Occupational History  . Not on file  Social Needs  . Financial resource strain: Not on file  . Food insecurity:    Worry: Not on file    Inability: Not on file  . Transportation needs:    Medical: Not on file    Non-medical: Not on file  Tobacco Use  . Smoking status: Former Smoker  Substance and Sexual Activity  . Alcohol use: No  . Drug use: No  . Sexual activity: Not on file  Lifestyle  . Physical activity:    Days per week: Not on file    Minutes per session: Not on file  . Stress: Not on file  Relationships  . Social connections:    Talks on phone: Not on file    Gets together: Not on file    Attends religious service: Not on file    Active member of club or organization: Not on file    Attends meetings of clubs or organizations: Not on file    Relationship status: Not on file  Other Topics Concern  . Not on file  Social History Narrative  . Not on file   Outpatient Encounter Medications as of 08/08/2018  Medication Sig  . albuterol (PROVENTIL HFA;VENTOLIN HFA) 108 (90 BASE) MCG/ACT inhaler Inhale 1-2 puffs into the lungs every 6 (six) hours as needed for wheezing or shortness of breath.  Marland Kitchen amLODipine (NORVASC) 10 MG tablet Take 10 mg by mouth daily.  . Ascorbic Acid  (VITAMIN C) 1000 MG tablet Take 1,000 mg by mouth daily.  Marland Kitchen atorvastatin (LIPITOR) 20 MG tablet Take 20 mg by mouth daily.  . carvedilol (COREG) 25 MG tablet Take 25 mg by mouth 2 (two) times daily with a meal.  . cloNIDine (CATAPRES - DOSED IN MG/24 HR) 0.3 mg/24hr patch Place 0.3 mg onto the skin once a week.  . clopidogrel (PLAVIX) 75 MG tablet Take 75 mg by mouth daily.  . fexofenadine (ALLEGRA) 180 MG tablet Take 180 mg by mouth daily.  . hydrALAZINE (APRESOLINE) 100 MG tablet Take 150 mg by mouth 2 (two) times daily.  . isosorbide mononitrate (IMDUR) 60 MG 24 hr tablet Take 60 mg by mouth daily.   . metFORMIN (GLUCOPHAGE) 500 MG tablet Take 500 mg by mouth 2 (two) times daily with a meal.  . montelukast (SINGULAIR) 10 MG tablet Take 10 mg by mouth daily.  Marland Kitchen olmesartan (BENICAR) 40 MG tablet Take 40 mg by mouth daily.  Marland Kitchen  omeprazole (PRILOSEC) 20 MG capsule Take 20 mg by mouth 2 (two) times daily before a meal.   . potassium chloride SA (K-DUR,KLOR-CON) 20 MEQ tablet Take 2 tablets (40 mEq total) by mouth 4 (four) times daily.  . ranolazine (RANEXA) 500 MG 12 hr tablet Take 500 mg by mouth 2 (two) times daily.  . sucralfate (CARAFATE) 1 g tablet Take 1 g by mouth 4 (four) times daily.  . [DISCONTINUED] nebivolol (BYSTOLIC) 10 MG tablet Take 10 mg by mouth daily.   No facility-administered encounter medications on file as of 08/08/2018.    ALLERGIES: No Known Allergies  VACCINATION STATUS:  There is no immunization history on file for this patient.  HPI Cody Shepard is 52 y.o. male who presents today with a medical history as above. he is returning with repeat CMP with improving potassium of 3.6 after he was seen in consultation for hypokalemia requested by Cody Richard, MD. -Refer to the note from his last visit.  -  He reports that he has known about low potassium only when he was advised on this consultation during his last visit with his  PMD.  However, review of his medical  records shows that he had hypokalemia  of 2.8 in May 2010.  His complete medical records his not available to review.  -He has multiple medical problems including type 2 diabetes on metformin, chronic severe hypertension requiring currently 7 different medications.   He also has hyperlipidemia, coronary artery disease on medical management. -He denies diarrhea, not on regular use of diuretics.  He is on chlorthalidone 25 mg p.o. daily among his other blood pressure medications.  He follows normal average [place] diet. -His  labs from Mar 16, 2018 showed potassium of 2.8, normal renal function.  His previsit labs show potassium of 3.6 on August 04, 2018.  -In June 2019 he underwent labs for plasma aldosterone concentration which was 8, plasma renin activity which was 0.12 giving Aldo/PRA of 66-elevated. -He is on potassium supplement, currently 40 mEq of KDur 4 times a day.  Patient reports compliance to his medications. -He smoked heavily in the past, not a current smoker. He is not physically  active generally. -Patient denies any family history of adrenal, thyroid, pituitary, nor parathyroid dysfunctions. -His father was diagnosed with CVA at age of 3, and later died of complications from coronary artery disease.  He denies family history of syndromic kidney dysfunctions.  -He is known to have migraine headaches, currently denies headaches, chest pain, shortness of breath.  He denies any rapid weight change.  No palpitations, diaphoresis, nor fainting spells.  He is referred by his cardiologist for hypokalemia.   Review of Systems  Constitutional: + steady weight, no fatigue, no subjective hyperthermia, no subjective hypothermia Eyes: no blurry vision, no xerophthalmia ENT: no sore throat, no nodules palpated in throat, no dysphagia/odynophagia, no hoarseness Cardiovascular: no Chest Pain, no Shortness of Breath, no palpitations, no leg swelling Respiratory: no cough, no  SOB Gastrointestinal: no Nausea/Vomiting/Diarhhea Musculoskeletal: no muscle/joint aches Skin: no rashes Neurological: no tremors, no numbness, no tingling, no dizziness Psychiatric: no depression, no anxiety  Objective:    BP 139/83   Pulse 76   Ht '5\' 9"'$  (1.753 m)   Wt 208 lb (94.3 kg)   BMI 30.72 kg/m   Wt Readings from Last 3 Encounters:  08/08/18 208 lb (94.3 kg)  07/19/18 206 lb (93.4 kg)  03/16/18 205 lb (93 kg)    Physical Exam  Constitutional: + Obese  for height,  not in acute distress, normal state of mind Eyes: PERRLA, EOMI, no exophthalmos ENT: moist mucous membranes, no thyromegaly, no cervical lymphadenopathy Cardiovascular: normal precordial activity, Regular Rate and Rhythm, no Murmur/Rubs/Gallops Respiratory:  adequate breathing efforts, no gross chest deformity, Clear to auscultation bilaterally Gastrointestinal: abdomen soft, Non -tender, No distension, Bowel Sounds present Musculoskeletal: no gross deformities, strength intact in all four extremities Skin: moist, warm, no rashes Neurological: no tremor with outstretched hands, Deep tendon reflexes normal in all four extremities.    Recent Results (from the past 2160 hour(s))  Comprehensive metabolic panel     Status: Abnormal   Collection Time: 07/19/18  2:01 PM  Result Value Ref Range   Sodium 140 135 - 145 mmol/L   Potassium 3.0 (L) 3.5 - 5.1 mmol/L   Chloride 103 98 - 111 mmol/L   CO2 29 22 - 32 mmol/L   Glucose, Bld 130 (H) 70 - 99 mg/dL   BUN 14 6 - 20 mg/dL   Creatinine, Ser 1.04 0.61 - 1.24 mg/dL   Calcium 8.9 8.9 - 10.3 mg/dL   Total Protein 7.6 6.5 - 8.1 g/dL   Albumin 4.1 3.5 - 5.0 g/dL   AST 27 15 - 41 U/L   ALT 30 0 - 44 U/L   Alkaline Phosphatase 49 38 - 126 U/L   Total Bilirubin 1.1 0.3 - 1.2 mg/dL   GFR calc non Af Amer >60 >60 mL/min   GFR calc Af Amer >60 >60 mL/min    Comment: (NOTE) The eGFR has been calculated using the CKD EPI equation. This calculation has not been  validated in all clinical situations. eGFR's persistently <60 mL/min signify possible Chronic Kidney Disease.    Anion gap 8 5 - 15    Comment: Performed at [facility], [address], [place], [place] [address]  Magnesium     Status: None   Collection Time: 07/19/18  2:01 PM  Result Value Ref Range   Magnesium 1.7 1.7 - 2.4 mg/dL    Comment: Performed at [facility], [address] [place], [place] [address]  Phosphorus     Status: None   Collection Time: 07/19/18  2:01 PM  Result Value Ref Range   Phosphorus 3.3 2.5 - 4.6 mg/dL    Comment: Performed at [facility], [address] [place],  [address]  Comprehensive metabolic panel     Status: Abnormal   Collection Time: 08/04/18 11:18 AM  Result Value Ref Range   Sodium 136 135 - 145 mmol/L   Potassium 3.6 3.5 - 5.1 mmol/L   Chloride 101 98 - 111 mmol/L   CO2 26 22 - 32 mmol/L   Glucose, Bld 226 (H) 70 - 99 mg/dL   BUN 10 6 - 20 mg/dL   Creatinine, Ser 1.16 0.61 - 1.24 mg/dL   Calcium 8.9 8.9 - 10.3 mg/dL   Total Protein 7.7 6.5 - 8.1 g/dL   Albumin 3.9 3.5 - 5.0 g/dL   AST 25 15 - 41 U/L   ALT 26 0 - 44 U/L   Alkaline Phosphatase 51 38 - 126 U/L   Total Bilirubin 0.8 0.3 - 1.2 mg/dL   GFR calc non Af Amer >60 >60 mL/min   GFR calc Af Amer >60 >60 mL/min    Comment: (NOTE) The eGFR has been calculated using the CKD EPI equation. This calculation has not been validated in all clinical situations. eGFR's persistently <60 mL/min signify possible Chronic Kidney Disease.  Anion gap 9 5 - 15    Comment: Performed at [facility], [address] [place], [place] [address]       Assessment & Plan:   1. Hypokalemia-chronic 2.  Severe  hypertension 3.  Type 2 diabetes -Recent A1c.  He is advised to continue metformin 500 mg p.o. twice daily.  - Based on review of his medical records, review of his medical records, he has subacute/chronic spontaneous hypokalemia associated with difficult to control hypertension  requiring up to 7 medications.  -Patient is not losing potassium through the GI source, not on diuretics.  He is not a suspect for renin secreting tumor.  However did not rule out renovascular hypertension nor coarctation of the aorta.    -Pretest probability for hyperaldosteronism is high in this patient. -His last PAC/PRA ratio was likely performed when his potassium was still low at 2.8.   -On a higher dose of potassium chloride-40 mEq 4 times daily-his potassium slowly improved to 3.6, patient remains asymptomatic.    He likely had potassium lower than 3 for months- years.  He has normal magnesium level.  -I will proceed to work-up for hyperaldosteronism with  plasma aldosterone concentration, plasma renin activity, PAC/PRA.  -He will also have 17 hydroxyprogesterone consultation measurements.  -If he is confirmed to have hyperaldosteronism, this will be followed up by confirmatory tests, and imaging studies to locate a source.   -Rare differential diagnosis for hypertension and hypokalemia including Liddle syndrome, congenital adrenal hyperplasia, DOC producing tumor, will be entertained based on his response.   -His blood pressure is controlled this morning . I did not make any changes to his blood pressure medications.  I have advised him to be consistent in taking his blood pressure medications per orders.  - I did not initiate any new prescriptions today. -I have emphasized the process of his next appointment to his labs and plan long-term care.   - I advised him  to maintain close follow up with Cody Richard, MD for primary care needs.  Follow up plan: Return in about 2 weeks (around 08/22/2018) for Follow up with Pre-visit Labs.   Glade Lloyd, MD [facility] [address] [place], [place] [address] Phone: [phone]  Fax: [phone]     08/08/2018, 3:32 PM  This note was partially dictated with voice  recognition software. Similar sounding words can be transcribed inadequately or may not  be corrected upon review.

## 2018-08-10 ENCOUNTER — Other Ambulatory Visit (HOSPITAL_COMMUNITY)
Admission: RE | Admit: 2018-08-10 | Discharge: 2018-08-10 | Disposition: A | Discharge status: Home / Self Care | Payer: Medicare Other | Source: Ambulatory Visit | Attending: "Endocrinology | Admitting: "Endocrinology

## 2018-08-10 DIAGNOSIS — E876 Hypokalemia: Secondary | ICD-10-CM | POA: Diagnosis present

## 2018-08-10 LAB — COMPREHENSIVE METABOLIC PANEL
ALBUMIN: 3.9 g/dL (ref 3.5–5.0)
ALK PHOS: 51 U/L (ref 38–126)
ALT: 28 U/L (ref 0–44)
ANION GAP: 8 (ref 5–15)
AST: 24 U/L (ref 15–41)
BILIRUBIN TOTAL: 0.9 mg/dL (ref 0.3–1.2)
BUN: 10 mg/dL (ref 6–20)
CALCIUM: 8.9 mg/dL (ref 8.9–10.3)
CO2: 29 mmol/L (ref 22–32)
Chloride: 103 mmol/L (ref 98–111)
Creatinine, Ser: 1.12 mg/dL (ref 0.61–1.24)
GFR calc Af Amer: >60 mL/min (ref >60)
GFR calc non Af Amer: >60 mL/min (ref >60)
GLUCOSE: 136 mg/dL — AB (ref 70–99)
Potassium: 3.3 mmol/L — ABNORMAL LOW (ref 3.5–5.1)
Sodium: 140 mmol/L (ref 135–145)
TOTAL PROTEIN: 7.7 g/dL (ref 6.5–8.1)

## 2018-08-10 LAB — HEMOGLOBIN A1C
Hgb A1c MFr Bld: 6.2 % — ABNORMAL HIGH (ref 4.8–5.6)
Mean Plasma Glucose: 131.24 mg/dL

## 2018-08-11 LAB — ACTH: C206 ACTH: 30 pg/mL (ref 7.2–63.3)

## 2018-08-12 LAB — 17-HYDROXYPROGESTERONE: 17-OH-PROGESTERONE, LC/MS/MS: 105 ng/dL (ref 27–199)

## 2018-08-13 LAB — ALDOSTERONE + RENIN ACTIVITY W/ RATIO
ALDO / PRA RATIO: 125.3 — AB (ref 0.0–30.0)
ALDOSTERONE: 23.8 ng/dL (ref 0.0–30.0)
PRA LC/MS/MS: 0.19 ng/mL/h (ref 0.167–5.380)

## 2018-08-30 ENCOUNTER — Ambulatory Visit (INDEPENDENT_AMBULATORY_CARE_PROVIDER_SITE_OTHER): Payer: Medicare Other | Admitting: "Endocrinology

## 2018-08-30 ENCOUNTER — Encounter: Payer: Self-pay | Admitting: "Endocrinology

## 2018-08-30 VITALS — BP 147/83 | HR 96 | Ht 69.0 in | Wt 207.0 lb

## 2018-08-30 DIAGNOSIS — E269 Hyperaldosteronism, unspecified: Secondary | ICD-10-CM

## 2018-08-30 DIAGNOSIS — E876 Hypokalemia: Secondary | ICD-10-CM

## 2018-08-30 DIAGNOSIS — I10 Essential (primary) hypertension: Secondary | ICD-10-CM | POA: Diagnosis not present

## 2018-08-30 NOTE — Progress Notes (Signed)
Endocrinology follow-up  Note                                            08/30/2018, 2:01 PM   Subjective:    Patient ID: Cody Shepard, male    DOB: [birth year], PCP Cody Richard, MD   Past Medical History:  Diagnosis Date  . Asthma   . Bronchitis   . COPD (chronic obstructive pulmonary disease) ([place])   . Coronary artery disease   . Diabetes mellitus without complication ([place])   . High cholesterol   . Hypertension    History reviewed. No pertinent surgical history. Social History   Socioeconomic History  . Marital status: Single    Spouse name: Not on file  . Number of children: Not on file  . Years of education: Not on file  . Highest education level: Not on file  Occupational History  . Not on file  Social Needs  . Financial resource strain: Not on file  . Food insecurity:    Worry: Not on file    Inability: Not on file  . Transportation needs:    Medical: Not on file    Non-medical: Not on file  Tobacco Use  . Smoking status: Former Smoker    Packs/day: 1.00    Types: Cigarettes    Last attempt to quit: 08/30/1997    Years since quitting: 21.0  . Smokeless tobacco: Never Used  Substance and Sexual Activity  . Alcohol use: No  . Drug use: No  . Sexual activity: Not on file  Lifestyle  . Physical activity:    Days per week: Not on file    Minutes per session: Not on file  . Stress: Not on file  Relationships  . Social connections:    Talks on phone: Not on file    Gets together: Not on file    Attends religious service: Not on file    Active member of club or organization: Not on file    Attends meetings of clubs or organizations: Not on file    Relationship status: Not on file  Other Topics Concern  . Not on file  Social History Narrative  . Not on file   Outpatient Encounter Medications as of 08/30/2018  Medication Sig  . albuterol (PROVENTIL HFA;VENTOLIN HFA) 108 (90 BASE) MCG/ACT inhaler Inhale 1-2 puffs into the lungs every 6 (six)  hours as needed for wheezing or shortness of breath.  Marland Kitchen amLODipine (NORVASC) 10 MG tablet Take 10 mg by mouth daily.  . Ascorbic Acid (VITAMIN C) 1000 MG tablet Take 1,000 mg by mouth daily.  Marland Kitchen atorvastatin (LIPITOR) 20 MG tablet Take 20 mg by mouth daily.  . carvedilol (COREG) 25 MG tablet Take 25 mg by mouth 2 (two) times daily with a meal.  . cloNIDine (CATAPRES - DOSED IN MG/24 HR) 0.3 mg/24hr patch Place 0.3 mg onto the skin once a week.  . clopidogrel (PLAVIX) 75 MG tablet Take 75 mg by mouth daily.  . fexofenadine (ALLEGRA) 180 MG tablet Take 180 mg by mouth daily.  . hydrALAZINE (APRESOLINE) 100 MG tablet Take 150 mg by mouth 2 (two) times daily.  . isosorbide mononitrate (IMDUR) 60 MG 24 hr tablet Take 60 mg by mouth daily.   . metFORMIN (GLUCOPHAGE) 500 MG tablet Take 500 mg by mouth 2 (two) times daily  with a meal.  . montelukast (SINGULAIR) 10 MG tablet Take 10 mg by mouth daily.  Marland Kitchen olmesartan (BENICAR) 40 MG tablet Take 40 mg by mouth daily.  Marland Kitchen omeprazole (PRILOSEC) 20 MG capsule Take 20 mg by mouth 2 (two) times daily before a meal.   . potassium chloride SA (K-DUR,KLOR-CON) 20 MEQ tablet Take 2 tablets (40 mEq total) by mouth 4 (four) times daily.  . ranolazine (RANEXA) 500 MG 12 hr tablet Take 500 mg by mouth 2 (two) times daily.  . sucralfate (CARAFATE) 1 g tablet Take 1 g by mouth 4 (four) times daily.   No facility-administered encounter medications on file as of 08/30/2018.    ALLERGIES: No Known Allergies  VACCINATION STATUS:  There is no immunization history on file for this patient.  HPI Cody Shepard is 52 y.o. male who presents today with a medical history as above. he is returning with repeat CMP with improving potassium of 3.6 after he was seen in consultation for hypokalemia requested by Cody Richard, MD. -Refer to the note from his last visit.  -  He reports that he has known about low potassium only when he was advised on this consultation during his last  visit with his  PMD.  However, review of his medical records shows that he had hypokalemia  of 2.8 in May 2010.  His complete medical records is not available to review.  -He has multiple medical problems including type 2 diabetes on metformin, chronic severe hypertension requiring currently 7 different medications.   He also has hyperlipidemia, coronary artery disease on medical management. -He denies diarrhea, not on regular use of diuretics.  He is on chlorthalidone 25 mg p.o. daily among his other blood pressure medications.  He follows normal average American diet. -His labs show potassium of 3.3 with normal renal function.    -In June 2019 he underwent labs for plasma aldosterone concentration which was 8, plasma renin activity which was 0.12 giving Aldo/PRA of 66-elevated.  -His repeat labs on August 10, 2018 showed plasma aldosterone concentration of 23.8, plasma renin activity of 0.19, Aldo/PRA of 125.3-elevated.   -He is on potassium supplement, currently 40 mEq of KDur 4 times a day.  Patient reports compliance to his medications. -He smoked heavily in the past, not a current smoker. He is not physically  active generally. -Patient denies any family history of adrenal, thyroid, pituitary, nor parathyroid dysfunctions. -His father was diagnosed with CVA at age of 18, and later died of complications from coronary artery disease.  He denies family history of syndromic kidney dysfunctions.  -He is known to have migraine headaches, currently denies headaches, chest pain, shortness of breath.  He denies any rapid weight change.  No palpitations, diaphoresis, nor fainting spells.  He is referred by his cardiologist for hypokalemia.   Review of Systems  Constitutional: + steady weight, no fatigue, no subjective hyperthermia, no subjective hypothermia Eyes: no blurry vision, no xerophthalmia ENT: no sore throat, no nodules palpated in throat, no dysphagia/odynophagia, no  hoarseness Cardiovascular: no Chest Pain, no Shortness of Breath, no palpitations, no leg swelling Respiratory: no cough, no SOB Gastrointestinal: no Nausea/Vomiting/Diarhhea Musculoskeletal: no muscle/joint aches Skin: no rashes Neurological: no tremors, no numbness, no tingling, no dizziness Psychiatric: no depression, no anxiety  Objective:    BP (!) 147/83   Pulse 96   Ht '5\' 9"'$  (1.753 m)   Wt 207 lb (93.9 kg)   BMI 30.57 kg/m   Wt Readings from Last 3  Encounters:  08/30/18 207 lb (93.9 kg)  08/08/18 208 lb (94.3 kg)  07/19/18 206 lb (93.4 kg)    Physical Exam  Constitutional: + Obese for height,  not in acute distress, normal state of mind Eyes: PERRLA, EOMI, no exophthalmos ENT: moist mucous membranes, no thyromegaly, no cervical lymphadenopathy Cardiovascular: normal precordial activity, Regular Rate and Rhythm, no Murmur/Rubs/Gallops Respiratory:  adequate breathing efforts, no gross chest deformity, Clear to auscultation bilaterally Gastrointestinal: abdomen soft, Non -tender, No distension, Bowel Sounds present Musculoskeletal: no gross deformities, strength intact in all four extremities Skin: moist, warm, no rashes Neurological: no tremor with outstretched hands, Deep tendon reflexes normal in all four extremities.    Recent Results (from the past 2160 hour(s))  Comprehensive metabolic panel     Status: Abnormal   Collection Time: 07/19/18  2:01 PM  Result Value Ref Range   Sodium 140 135 - 145 mmol/L   Potassium 3.0 (L) 3.5 - 5.1 mmol/L   Chloride 103 98 - 111 mmol/L   CO2 29 22 - 32 mmol/L   Glucose, Bld 130 (H) 70 - 99 mg/dL   BUN 14 6 - 20 mg/dL   Creatinine, Ser 1.04 0.61 - 1.24 mg/dL   Calcium 8.9 8.9 - 10.3 mg/dL   Total Protein 7.6 6.5 - 8.1 g/dL   Albumin 4.1 3.5 - 5.0 g/dL   AST 27 15 - 41 U/L   ALT 30 0 - 44 U/L   Alkaline Phosphatase 49 38 - 126 U/L   Total Bilirubin 1.1 0.3 - 1.2 mg/dL   GFR calc non Af Amer >60 >60 mL/min   GFR calc Af  Amer >60 >60 mL/min    Comment: (NOTE) The eGFR has been calculated using the CKD EPI equation. This calculation has not been validated in all clinical situations. eGFR's persistently <60 mL/min signify possible Chronic Kidney Disease.    Anion gap 8 5 - 15    Comment: Performed at [facility], [address], [place], [place] [address]  Magnesium     Status: None   Collection Time: 07/19/18  2:01 PM  Result Value Ref Range   Magnesium 1.7 1.7 - 2.4 mg/dL    Comment: Performed at [facility], [address] [place], [place] [address]  Phosphorus     Status: None   Collection Time: 07/19/18  2:01 PM  Result Value Ref Range   Phosphorus 3.3 2.5 - 4.6 mg/dL    Comment: Performed at [facility], [address], [place], [place] [address]  Comprehensive metabolic panel     Status: Abnormal   Collection Time: 08/04/18 11:18 AM  Result Value Ref Range   Sodium 136 135 - 145 mmol/L   Potassium 3.6 3.5 - 5.1 mmol/L   Chloride 101 98 - 111 mmol/L   CO2 26 22 - 32 mmol/L   Glucose, Bld 226 (H) 70 - 99 mg/dL   BUN 10 6 - 20 mg/dL   Creatinine, Ser 1.16 0.61 - 1.24 mg/dL   Calcium 8.9 8.9 - 10.3 mg/dL   Total Protein 7.7 6.5 - 8.1 g/dL   Albumin 3.9 3.5 - 5.0 g/dL   AST 25 15 - 41 U/L   ALT 26 0 - 44 U/L   Alkaline Phosphatase 51 38 - 126 U/L   Total Bilirubin 0.8 0.3 - 1.2 mg/dL   GFR calc non Af Amer >60 >60 mL/min   GFR calc Af Amer >60 >60 mL/min    Comment: (NOTE) The eGFR has  been calculated using the CKD EPI equation. This calculation has not been validated in all clinical situations. eGFR's persistently <60 mL/min signify possible Chronic Kidney Disease.    Anion gap 9 5 - 15    Comment: Performed at [facility], [address], [place], [place] [address]  Aldosterone + renin activity w/ ratio     Status: Abnormal   Collection Time: 08/10/18  8:08 AM  Result Value Ref Range   PRA LC/MS/MS 0.190 0.167 - 5.380 ng/mL/hr    Comment: (NOTE) This test was developed and its  performance characteristics determined by [facility]. It has not been cleared or approved by the Food and Drug Administration.    ALDO / PRA Ratio 125.3 (H) 0.0 - 30.0    Comment: (NOTE)                         Units:      ng/dL per ng/mL/hr Performed At: [facility] [place], [place] [id number] Rush Farmer MD [id number]    Aldosterone 23.8 0.0 - 30.0 ng/dL    Comment: (NOTE) This test was developed and its performance characteristics determined by [facility]. It has not been cleared or approved by the Food and Drug Administration.   Comprehensive metabolic panel     Status: Abnormal   Collection Time: 08/10/18  8:08 AM  Result Value Ref Range   Sodium 140 135 - 145 mmol/L   Potassium 3.3 (L) 3.5 - 5.1 mmol/L   Chloride 103 98 - 111 mmol/L   CO2 29 22 - 32 mmol/L   Glucose, Bld 136 (H) 70 - 99 mg/dL   BUN 10 6 - 20 mg/dL   Creatinine, Ser 1.12 0.61 - 1.24 mg/dL   Calcium 8.9 8.9 - 10.3 mg/dL   Total Protein 7.7 6.5 - 8.1 g/dL   Albumin 3.9 3.5 - 5.0 g/dL   AST 24 15 - 41 U/L   ALT 28 0 - 44 U/L   Alkaline Phosphatase 51 38 - 126 U/L   Total Bilirubin 0.9 0.3 - 1.2 mg/dL   GFR calc non Af Amer >60 >60 mL/min   GFR calc Af Amer >60 >60 mL/min    Comment: (NOTE) The eGFR has been calculated using the CKD EPI equation. This calculation has not been validated in all clinical situations. eGFR's persistently <60 mL/min signify possible Chronic Kidney Disease.    Anion gap 8 5 - 15    Comment: Performed at [facility], [address] [place], [place] [address]  ACTH     Status: None   Collection Time: 08/10/18  8:08 AM  Result Value Ref Range   C206 ACTH 30.0 7.2 - 63.3 pg/mL    Comment: (NOTE) ACTH reference interval for samples collected between 7 and 10 AM. Performed At: [facility] [place], [place] [id number] Rush Farmer MD [id number]   Hemoglobin A1c     Status: Abnormal   Collection Time: 08/10/18  8:10 AM   Result Value Ref Range   Hgb A1c MFr Bld 6.2 (H) 4.8 - 5.6 %    Comment: (NOTE) Pre diabetes:          5.7%-6.4% Diabetes:              >6.4% Glycemic control for   <7.0% adults with diabetes    Mean Plasma Glucose 131.24 mg/dL    Comment: Performed at [facility], [place] [address] [place], [place]  [address]  17-Hydroxyprogesterone     Status: None   Collection Time: 08/10/18  8:10 AM  Result Value Ref Range   17-OH-Progesterone, LC/MS/MS 105 27 - 199 ng/dL    Comment: (NOTE) This test was developed and its performance characteristics determined by [facility]. It has not been cleared or approved by the Food and Drug Administration. Performed At: [facility] [place], [place] [id number] Rush Farmer MD [id number]        Assessment & Plan:   1. Hypokalemia-chronic 2.  Severe  hypertension 3.  Type 2 diabetes -Recent A1c.  He is advised to continue metformin 500 mg p.o. twice daily.  - Based on review of his medical records, review of his medical records, he has subacute/chronic spontaneous hypokalemia associated with difficult to control hypertension requiring up to 7 medications.  -Patient is not losing potassium through the GI source, not on diuretics.  He is not a suspect for renin secreting tumor.  -His repeat PAC/PRA ratio is higher at 125.3 while potassium was 3.3. -Pretest probability for hyperaldosteronism is high in this patient. However did not rule out renovascular hypertension nor coarctation of the aorta.   -Confirmatory test for hyperaldosteronism such as salt loading or saline challenge will be challenging in this patient with severe hypertension. -I will proceed with imaging studies in hope of localizing source-adrenal CT without contrast as an initial imaging study.  -His 17 hydroxyprogesterone is within normal limits.   -Rare differential diagnosis for hypertension and hypokalemia including Liddle syndrome, congenital adrenal  hyperplasia, DOC producing tumor, will be entertained based on his response.   -His blood pressure is  not controlled this morning . I did not make any changes to his blood pressure medications.  I have advised him to be consistent in taking his blood pressure medications per orders.  - I did not initiate any new prescriptions today.  - I advised him  to maintain close follow up with Cody Richard, MD for primary care needs.  Follow up plan: Return in about 4 weeks (around 09/27/2018), or CT Adrenal .   Glade Lloyd, MD [facility] [address] [place], [place] [address] Phone: [phone]  Fax: [phone]     08/30/2018, 2:01 PM  This note was partially dictated with voice recognition software. Similar sounding words can be transcribed inadequately or may not  be corrected upon review.

## 2018-09-28 ENCOUNTER — Ambulatory Visit (HOSPITAL_COMMUNITY)
Admission: RE | Admit: 2018-09-28 | Discharge: 2018-09-28 | Disposition: A | Discharge status: Home / Self Care | Payer: Medicare Other | Source: Ambulatory Visit | Attending: "Endocrinology | Admitting: "Endocrinology

## 2018-09-28 DIAGNOSIS — E269 Hyperaldosteronism, unspecified: Secondary | ICD-10-CM | POA: Diagnosis present

## 2018-10-02 ENCOUNTER — Encounter: Payer: Self-pay | Admitting: "Endocrinology

## 2018-10-02 ENCOUNTER — Ambulatory Visit (INDEPENDENT_AMBULATORY_CARE_PROVIDER_SITE_OTHER): Payer: Medicare Other | Admitting: "Endocrinology

## 2018-10-02 VITALS — BP 138/82 | HR 117 | Temp 97.7°F | Ht 69.0 in | Wt 198.0 lb

## 2018-10-02 DIAGNOSIS — E269 Hyperaldosteronism, unspecified: Secondary | ICD-10-CM | POA: Diagnosis not present

## 2018-10-02 DIAGNOSIS — D3502 Benign neoplasm of left adrenal gland: Secondary | ICD-10-CM | POA: Diagnosis not present

## 2018-10-02 DIAGNOSIS — E876 Hypokalemia: Secondary | ICD-10-CM | POA: Diagnosis not present

## 2018-10-02 DIAGNOSIS — I10 Essential (primary) hypertension: Secondary | ICD-10-CM | POA: Diagnosis not present

## 2018-10-02 NOTE — Progress Notes (Signed)
Endocrinology follow-up  Note                                            10/02/2018, 1:35 PM   Subjective:    Patient ID: Cody Shepard, male    DOB: 27-Dec-1965, PCP Abran Richard, MD   Past Medical History:  Diagnosis Date  . Asthma   . Bronchitis   . COPD (chronic obstructive pulmonary disease) ([place])   . Coronary artery disease   . Diabetes mellitus without complication ([place])   . High cholesterol   . Hypertension    History reviewed. No pertinent surgical history. Social History   Socioeconomic History  . Marital status: Single    Spouse name: Not on file  . Number of children: Not on file  . Years of education: Not on file  . Highest education level: Not on file  Occupational History  . Not on file  Social Needs  . Financial resource strain: Not on file  . Food insecurity:    Worry: Not on file    Inability: Not on file  . Transportation needs:    Medical: Not on file    Non-medical: Not on file  Tobacco Use  . Smoking status: Former Smoker    Packs/day: 1.00    Types: Cigarettes    Last attempt to quit: 08/30/1997    Years since quitting: 21.1  . Smokeless tobacco: Never Used  Substance and Sexual Activity  . Alcohol use: No  . Drug use: No  . Sexual activity: Not on file  Lifestyle  . Physical activity:    Days per week: Not on file    Minutes per session: Not on file  . Stress: Not on file  Relationships  . Social connections:    Talks on phone: Not on file    Gets together: Not on file    Attends religious service: Not on file    Active member of club or organization: Not on file    Attends meetings of clubs or organizations: Not on file    Relationship status: Not on file  Other Topics Concern  . Not on file  Social History Narrative  . Not on file   Outpatient Encounter Medications as of 10/02/2018  Medication Sig  . albuterol (PROVENTIL HFA;VENTOLIN HFA) 108 (90 BASE) MCG/ACT inhaler Inhale 1-2 puffs into the lungs every 6 (six)  hours as needed for wheezing or shortness of breath.  Marland Kitchen amLODipine (NORVASC) 10 MG tablet Take 10 mg by mouth daily.  . Ascorbic Acid (VITAMIN C) 1000 MG tablet Take 1,000 mg by mouth daily.  Marland Kitchen atorvastatin (LIPITOR) 20 MG tablet Take 20 mg by mouth daily.  . carvedilol (COREG) 25 MG tablet Take 25 mg by mouth 2 (two) times daily with a meal.  . cloNIDine (CATAPRES - DOSED IN MG/24 HR) 0.3 mg/24hr patch Place 0.3 mg onto the skin once a week.  . clopidogrel (PLAVIX) 75 MG tablet Take 75 mg by mouth daily.  . fexofenadine (ALLEGRA) 180 MG tablet Take 180 mg by mouth daily.  . hydrALAZINE (APRESOLINE) 100 MG tablet Take 150 mg by mouth 2 (two) times daily.  . isosorbide mononitrate (IMDUR) 60 MG 24 hr tablet Take 60 mg by mouth daily.   . metFORMIN (GLUCOPHAGE) 500 MG tablet Take 500 mg by mouth 2 (two) times daily  with a meal.  . montelukast (SINGULAIR) 10 MG tablet Take 10 mg by mouth daily.  Marland Kitchen olmesartan (BENICAR) 40 MG tablet Take 40 mg by mouth daily.  Marland Kitchen omeprazole (PRILOSEC) 20 MG capsule Take 20 mg by mouth 2 (two) times daily before a meal.   . potassium chloride SA (K-DUR,KLOR-CON) 20 MEQ tablet Take 2 tablets (40 mEq total) by mouth 4 (four) times daily.  . ranolazine (RANEXA) 500 MG 12 hr tablet Take 500 mg by mouth 2 (two) times daily.  . sucralfate (CARAFATE) 1 g tablet Take 1 g by mouth 4 (four) times daily.   No facility-administered encounter medications on file as of 10/02/2018.    ALLERGIES: No Known Allergies  VACCINATION STATUS:  There is no immunization history on file for this patient.  HPI [name] is 52 y.o. male who presents today with a medical history as above. 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  He was sent for adrenal CT scan during his last visit. -Refer to the note from his last visit.  -  He reports that he has known about low potassium only when he was advised on this consultation during  his last visit with his  PMD.  However, review of his medical records shows that he had hypokalemia  of 2.8 in May 2010.  His complete medical records is not available to review.  -He has multiple medical problems including type 2 diabetes on metformin, chronic severe hypertension requiring currently 7 different medications.   He also has hyperlipidemia, coronary artery disease on medical management. -He denies diarrhea, not on regular use of diuretics.  He is on chlorthalidone 25 mg p.o. daily among his other blood pressure medications.  He follows normal average American diet. -His labs show potassium of 3.3 with normal renal function.    -In June 2019 he underwent labs for plasma aldosterone concentration which was 8, plasma renin activity which was 0.12 giving Aldo/PRA of 66-elevated.  -His repeat labs on August 10, 2018 showed plasma aldosterone concentration of 23.8, plasma renin activity of 0.19, Aldo/PRA of 125.3-elevated.  -His previsit CT adrenals revealed 1.7 cm left adrenal adenoma with Hounsfield units of 0, normal right adrenal gland.   -He is on potassium supplement, currently 40 mEq of KDur 4 times a day.  Patient reports compliance to his medications. -He smoked heavily in the past, not a current smoker. He is not physically  active generally. -Patient denies any family history of adrenal, thyroid, pituitary, nor parathyroid dysfunctions. -His father was diagnosed with CVA at age of 39, and later died of complications from coronary artery disease.  He denies family history of syndromic kidney dysfunctions.  -He is known to have migraine headaches, currently denies headaches, chest pain, shortness of breath.  He denies any rapid weight change.  No palpitations, diaphoresis, nor fainting spells.  He is referred by his cardiologist for hypokalemia.   Review of Systems  Constitutional: + Lost 10 pounds since October 2019 , no fatigue, no subjective hyperthermia, no subjective  hypothermia Eyes: no blurry vision, no xerophthalmia ENT: no sore throat, no nodules palpated in throat, no dysphagia/odynophagia, no hoarseness Cardiovascular: no Chest Pain, no Shortness of Breath, no palpitations, no leg swelling Musculoskeletal: no muscle/joint aches Skin: no rashes Neurological: no tremors, no numbness, no tingling, no dizziness Psychiatric: no depression, no anxiety  Objective:    BP 138/82   Pulse (!) 117   Temp 97.7 F (36.5 C)  Ht _0  (1.753 m)   Wt 198 lb (89.8 kg)   BMI 29.24 kg/m   Wt Readings from Last 3 Encounters:  10/02/18 198 lb (89.8 kg)  08/30/18 207 lb (93.9 kg)  08/08/18 208 lb (94.3 kg)    Physical Exam  Constitutional: + Obese for height,  not in acute distress, normal state of mind Eyes: PERRLA, EOMI, no exophthalmos ENT: moist mucous membranes, no thyromegaly, no cervical lymphadenopathy Cardiovascular: normal precordial activity, Regular Rate and Rhythm, no Murmur/Rubs/Gallops Respiratory:  adequate breathing efforts, no gross chest deformity, Clear to auscultation bilaterally Gastrointestinal: abdomen soft, Non -tender, No distension, Bowel Sounds present Musculoskeletal: no gross deformities, strength intact in all four extremities Skin: moist, warm, no rashes Neurological: no tremor with outstretched hands, Deep tendon reflexes normal in all four extremities.    Recent Results (from the past 2160 hour(s))  Comprehensive metabolic panel     Status: Abnormal   Collection Time: 07/19/18  2:01 PM  Result Value Ref Range   Sodium 140 135 - 145 mmol/L   Potassium 3.0 (L) 3.5 - 5.1 mmol/L   Chloride 103 98 - 111 mmol/L   CO2 29 22 - 32 mmol/L   Glucose, Bld 130 (H) 70 - 99 mg/dL   BUN 14 6 - 20 mg/dL   Creatinine, Ser 1.04 0.61 - 1.24 mg/dL   Calcium 8.9 8.9 - 10.3 mg/dL   Total Protein 7.6 6.5 - 8.1 g/dL   Albumin 4.1 3.5 - 5.0 g/dL   AST 27 15 - 41 U/L   ALT 30 0 - 44 U/L   Alkaline Phosphatase 49 38 - 126 U/L   Total  Bilirubin 1.1 0.3 - 1.2 mg/dL   GFR calc non Af Amer >60 >60 mL/min   GFR calc Af Amer >60 >60 mL/min    Comment: (NOTE) The eGFR has been calculated using the CKD EPI equation. This calculation has not been validated in all clinical situations. eGFR's persistently <60 mL/min signify possible Chronic Kidney Disease.    Anion gap 8 5 - 15    Comment: Performed at [facility], [address], [place], [place] [address]  Magnesium     Status: None   Collection Time: 07/19/18  2:01 PM  Result Value Ref Range   Magnesium 1.7 1.7 - 2.4 mg/dL    Comment: Performed at [facility], [address], [place], [place] [address]  Phosphorus     Status: None   Collection Time: 07/19/18  2:01 PM  Result Value Ref Range   Phosphorus 3.3 2.5 - 4.6 mg/dL    Comment: Performed at [facility], [address] [place], [place] [address]  Comprehensive metabolic panel     Status: Abnormal   Collection Time: 08/04/18 11:18 AM  Result Value Ref Range   Sodium 136 135 - 145 mmol/L   Potassium 3.6 3.5 - 5.1 mmol/L   Chloride 101 98 - 111 mmol/L   CO2 26 22 - 32 mmol/L   Glucose, Bld 226 (H) 70 - 99 mg/dL   BUN 10 6 - 20 mg/dL   Creatinine, Ser 1.16 0.61 - 1.24 mg/dL   Calcium 8.9 8.9 - 10.3 mg/dL   Total Protein 7.7 6.5 - 8.1 g/dL   Albumin 3.9 3.5 - 5.0 g/dL   AST 25 15 - 41 U/L   ALT 26 0 - 44 U/L   Alkaline Phosphatase 51 38 - 126 U/L   Total Bilirubin 0.8 0.3 - 1.2 mg/dL   GFR  calc non Af Amer >60 >60 mL/min   GFR calc Af Amer >60 >60 mL/min    Comment: (NOTE) The eGFR has been calculated using the CKD EPI equation. This calculation has not been validated in all clinical situations. eGFR's persistently <60 mL/min signify possible Chronic Kidney Disease.    Anion gap 9 5 - 15    Comment: Performed at [facility], [address], [place], [place] [address]  Aldosterone + renin activity w/ ratio     Status: Abnormal   Collection Time: 08/10/18  8:08 AM  Result Value Ref Range   PRA LC/MS/MS  0.190 0.167 - 5.380 ng/mL/hr    Comment: (NOTE) This test was developed and its performance characteristics determined by [facility]. It has not been cleared or approved by the Food and Drug Administration.    ALDO / PRA Ratio 125.3 (H) 0.0 - 30.0    Comment: (NOTE)                         Units:      ng/dL per ng/mL/hr Performed At: [facility] [place], [place] [id number] Rush Farmer MD [id number]    Aldosterone 23.8 0.0 - 30.0 ng/dL    Comment: (NOTE) This test was developed and its performance characteristics determined by [facility]. It has not been cleared or approved by the Food and Drug Administration.   Comprehensive metabolic panel     Status: Abnormal   Collection Time: 08/10/18  8:08 AM  Result Value Ref Range   Sodium 140 135 - 145 mmol/L   Potassium 3.3 (L) 3.5 - 5.1 mmol/L   Chloride 103 98 - 111 mmol/L   CO2 29 22 - 32 mmol/L   Glucose, Bld 136 (H) 70 - 99 mg/dL   BUN 10 6 - 20 mg/dL   Creatinine, Ser 1.12 0.61 - 1.24 mg/dL   Calcium 8.9 8.9 - 10.3 mg/dL   Total Protein 7.7 6.5 - 8.1 g/dL   Albumin 3.9 3.5 - 5.0 g/dL   AST 24 15 - 41 U/L   ALT 28 0 - 44 U/L   Alkaline Phosphatase 51 38 - 126 U/L   Total Bilirubin 0.9 0.3 - 1.2 mg/dL   GFR calc non Af Amer >60 >60 mL/min   GFR calc Af Amer >60 >60 mL/min    Comment: (NOTE) The eGFR has been calculated using the CKD EPI equation. This calculation has not been validated in all clinical situations. eGFR's persistently <60 mL/min signify possible Chronic Kidney Disease.    Anion gap 8 5 - 15    Comment: Performed at [facility], [address], [place], [place] [address]  ACTH     Status: None   Collection Time: 08/10/18  8:08 AM  Result Value Ref Range   C206 ACTH 30.0 7.2 - 63.3 pg/mL    Comment: (NOTE) ACTH reference interval for samples collected between 7 and 10 AM. Performed At: [facility] [place], [place] [id number] Rush Farmer MD  [id number]   Hemoglobin A1c     Status: Abnormal   Collection Time: 08/10/18  8:10 AM  Result Value Ref Range   Hgb A1c MFr Bld 6.2 (H) 4.8 - 5.6 %    Comment: (NOTE) Pre diabetes:          5.7%-6.4% Diabetes:              >6.4% Glycemic control for   <7.0% adults with diabetes  Mean Plasma Glucose 131.24 mg/dL    Comment: Performed at [facility] [address], [place], [place] [address]  17-Hydroxyprogesterone     Status: None   Collection Time: 08/10/18  8:10 AM  Result Value Ref Range   17-OH-Progesterone, LC/MS/MS 105 27 - 199 ng/dL    Comment: (NOTE) This test was developed and its performance characteristics determined by [facility]. It has not been cleared or approved by the Food and Drug Administration. Performed At: [facility] [place], [place] [id number] Rush Farmer MD [id number]        Assessment & Plan:   1. Hypokalemia-chronic 2.  Severe  Hypertension 3.  Hyperaldosteronism 3.  Type 2 diabetes -Recent A1c.  He is advised to continue metformin 500 mg p.o. twice daily.  - Based on review of his medical records, review of his medical records, he has subacute/chronic spontaneous hypokalemia associated with difficult to control hypertension requiring up to 7 medications.  -Patient is not losing potassium through the GI source, not on diuretics.  He is not a suspect for renin secreting tumor.  -His repeat PAC/PRA ratio is higher at 125.3 while potassium was 3.3. -Pretest probability for hyperaldosteronism is high in this patient. However did not rule out renovascular hypertension nor coarctation of the aorta.   -Confirmatory test for hyperaldosteronism such as salt loading or saline challenge will be challenging in this patient with severe hypertension. -His previsit adrenal CT showed 1.7 cm left adrenal adenoma with Hounsfield units of 0.    -He will likely benefit from surgical excision of this adenoma which is likely  the primary source of hyperaldosteronism.  I discussed and initiated referral to Dr. Armandina Gemma for possible left adrenal adenomectomy.    He has tachycardia this morning.  He will have plasma free metanephrines to screen for pheochromocytoma before his surgery.  -His blood pressure is  controlled this morning . I did not make any changes to his blood pressure medications.  I have advised him to be consistent in taking his blood pressure medications per orders. - I advised him  to maintain close follow up with Abran Richard, MD for primary care needs.  Follow up plan: Return in about 6 weeks (around 11/13/2018) for Follow up with Labs after Surgery.   Glade Lloyd, MD [facility] [address] [place],  [address] Phone: [phone]  Fax: [phone]     10/02/2018, 1:35 PM  This note was partially dictated with voice recognition software. Similar sounding words can be transcribed inadequately or may not  be corrected upon review.

## 2018-10-04 ENCOUNTER — Other Ambulatory Visit (HOSPITAL_COMMUNITY)
Admission: RE | Admit: 2018-10-04 | Discharge: 2018-10-04 | Disposition: A | Discharge status: Home / Self Care | Payer: Medicare Other | Source: Ambulatory Visit | Attending: "Endocrinology | Admitting: "Endocrinology

## 2018-10-04 DIAGNOSIS — E269 Hyperaldosteronism, unspecified: Secondary | ICD-10-CM | POA: Diagnosis present

## 2018-10-04 LAB — COMPREHENSIVE METABOLIC PANEL
ALBUMIN: 4.3 g/dL (ref 3.5–5.0)
ALT: 69 U/L — ABNORMAL HIGH (ref 0–44)
AST: 63 U/L — ABNORMAL HIGH (ref 15–41)
Alkaline Phosphatase: 48 U/L (ref 38–126)
Anion gap: 9 (ref 5–15)
BUN: 24 mg/dL — AB (ref 6–20)
CHLORIDE: 104 mmol/L (ref 98–111)
CO2: 22 mmol/L (ref 22–32)
CREATININE: 1.67 mg/dL — AB (ref 0.61–1.24)
Calcium: 9.8 mg/dL (ref 8.9–10.3)
GFR, EST AFRICAN AMERICAN: 53 mL/min — AB (ref >60)
GFR, EST NON AFRICAN AMERICAN: 46 mL/min — AB (ref >60)
GLUCOSE: 171 mg/dL — AB (ref 70–99)
POTASSIUM: 3.9 mmol/L (ref 3.5–5.1)
Sodium: 135 mmol/L (ref 135–145)
TOTAL PROTEIN: 8.1 g/dL (ref 6.5–8.1)
Total Bilirubin: 1 mg/dL (ref 0.3–1.2)

## 2018-10-07 LAB — METANEPHRINES, PLASMA
Metanephrine, Free: 42 pg/mL (ref 0–62)
NORMETANEPHRINE FREE: 256 pg/mL — AB (ref 0–145)

## 2018-10-09 ENCOUNTER — Other Ambulatory Visit: Payer: Self-pay | Admitting: "Endocrinology

## 2018-10-09 DIAGNOSIS — D3502 Benign neoplasm of left adrenal gland: Secondary | ICD-10-CM

## 2018-10-11 ENCOUNTER — Other Ambulatory Visit: Payer: Self-pay

## 2018-10-11 DIAGNOSIS — D3502 Benign neoplasm of left adrenal gland: Secondary | ICD-10-CM

## 2018-10-11 NOTE — Progress Notes (Signed)
Pt.notified

## 2018-11-15 ENCOUNTER — Ambulatory Visit: Payer: Medicare Other | Admitting: "Endocrinology

## 2018-11-20 ENCOUNTER — Ambulatory Visit: Payer: Self-pay | Admitting: Surgery

## 2019-01-01 NOTE — Patient Instructions (Addendum)
Cody Shepard  01/01/2019   Your procedure is scheduled on: Thursday 01/04/2019  Report to [facility] Main  Entrance              Report to admitting at  1100  AM    Call this number if you have problems the morning of surgery [phone]    Remember: Do not eat food  :After Midnight. May have clear liquids from midnight up until 0700 am then nothing until after surgery!    CLEAR LIQUID DIET   Foods Allowed                                                                     Foods Excluded  Coffee and tea, regular and decaf                             liquids that you cannot  Plain Jell-O in any flavor                                             see through such as: Fruit ices (not with fruit pulp)                                     milk, soups, orange juice  Iced Popsicles                                    All solid food Carbonated beverages, regular and diet                                    Cranberry, grape and apple juices Sports drinks like Gatorade Lightly seasoned clear broth or consume(fat free) Sugar, honey syrup  Sample Menu Breakfast                                Lunch                                     Supper Cranberry juice                    Beef broth                            Chicken broth Jell-O                                     Grape juice  Apple juice Coffee or tea                        Jell-O                                      Popsicle                                                Coffee or tea                        Coffee or tea  _____________________________________________________________________  How to Manage Your Diabetes Before and After Surgery  Why is it important to control my blood sugar before and after surgery? . Improving blood sugar levels before and after surgery helps healing and can limit problems. . A way of improving blood sugar control is eating a healthy diet by: o   Eating less sugar and carbohydrates o  Increasing activity/exercise o  Talking with your doctor about reaching your blood sugar goals . High blood sugars (greater than 180 mg/dL) can raise your risk of infections and slow your recovery, so you will need to focus on controlling your diabetes during the weeks before surgery. . Make sure that the doctor who takes care of your diabetes knows about your planned surgery including the date and location.  How do I manage my blood sugar before surgery? . Check your blood sugar at least 4 times a day, starting 2 days before surgery, to make sure that the level is not too high or low. o Check your blood sugar the morning of your surgery when you wake up and every 2 hours until you get to the Short Stay unit. . If your blood sugar is less than 70 mg/dL, you will need to treat for low blood sugar: o Do not take insulin. o Treat a low blood sugar (less than 70 mg/dL) with  cup of clear juice (cranberry or apple), 4 glucose tablets, OR glucose gel. o Recheck blood sugar in 15 minutes after treatment (to make sure it is greater than 70 mg/dL). If your blood sugar is not greater than 70 mg/dL on recheck, call [phone] for further instructions. . Report your blood sugar to the short stay nurse when you get to Short Stay.  . If you are admitted to the hospital after surgery: o Your blood sugar will be checked by the staff and you will probably be given insulin after surgery (instead of oral diabetes medicines) to make sure you have good blood sugar levels. o The goal for blood sugar control after surgery is 80-180 mg/dL.   WHAT DO I DO ABOUT MY DIABETES MEDICATION?        The day before surgery, Take Metformin as usual.  . Do not take oral diabetes medicines (pills) the morning of surgery.               BRUSH YOUR TEETH MORNING OF SURGERY AND RINSE YOUR MOUTH OUT, NO CHEWING GUM CANDY OR MINTS.     Take these medicines the morning of surgery with A  SIP OF WATER: Amlodipine (Norvasc), Carvedilol (Coreg), Hydralazine (Apresoline ), Isosorbide Mononitrate( imdur), Montelukast (Singulair), Omeprazole (Prilosec), Ranolazine (Ranexa), use  Albuterol inhaler if needed and Duoneb inhaler if needed and bring inhalers with you to the hospital               DO NOT [place] may not have any metal on your body including hair pins and              piercings  Do not wear jewelry, make-up, lotions, powders or perfumes, deodorant                      Men may shave face and neck.   Do not bring valuables to the hospital. [place].  Contacts, dentures or bridgework may not be worn into surgery.  Leave suitcase in the car. After surgery it may be brought to your room.                  Please read over the following fact sheets you were given: _____________________________________________________________________             [facility] - Preparing for Surgery Before surgery, you can play an important role.  Because skin is not sterile, your skin needs to be as free of germs as possible.  You can reduce the number of germs on your skin by washing with CHG (chlorahexidine gluconate) soap before surgery.  CHG is an antiseptic cleaner which kills germs and bonds with the skin to continue killing germs even after washing. Please DO NOT use if you have an allergy to CHG or antibacterial soaps.  If your skin becomes reddened/irritated stop using the CHG and inform your nurse when you arrive at Short Stay. Do not shave (including legs and underarms) for at least 48 hours prior to the first CHG shower.  You may shave your face/neck. Please follow these instructions carefully:  1.  Shower with CHG Soap the night before surgery and the  morning of Surgery.  2.  If you choose to wash your hair, wash your hair first as usual with your  normal   shampoo.  3.  After you shampoo, rinse your hair and body thoroughly to remove the  shampoo.                           4.  Use CHG as you would any other liquid soap.  You can apply chg directly  to the skin and wash                       Gently with a scrungie or clean washcloth.  5.  Apply the CHG Soap to your body ONLY FROM THE NECK DOWN.   Do not use on face/ open                           Wound or open sores. Avoid contact with eyes, ears mouth and genitals (private parts).                       Wash face,  Genitals (private parts) with your normal soap.  6.  Wash thoroughly, paying special attention to the area where your surgery  will be performed.  7.  Thoroughly rinse your body with warm water from the neck down.  8.  DO NOT shower/wash with your normal soap after using and rinsing off  the CHG Soap.                9.  Pat yourself dry with a clean towel.            10.  Wear clean pajamas.            11.  Place clean sheets on your bed the night of your first shower and do not  sleep with pets. Day of Surgery : Do not apply any lotions/deodorants the morning of surgery.  Please wear clean clothes to the hospital/surgery center.  FAILURE TO FOLLOW THESE INSTRUCTIONS MAY RESULT IN THE CANCELLATION OF YOUR SURGERY PATIENT SIGNATURE_________________________________  NURSE SIGNATURE__________________________________  ________________________________________________________________________ [facility] - Preparing for Surgery Before surgery, you can play an important role.  Because skin is not sterile, your skin needs to be as free of germs as possible.  You can reduce the number of germs on your skin by washing with CHG (chlorahexidine gluconate) soap before surgery.  CHG is an antiseptic cleaner which kills germs and bonds with the skin to continue killing germs even after washing. Please DO NOT use if you have an allergy to CHG or antibacterial soaps.  If your skin becomes  reddened/irritated stop using the CHG and inform your nurse when you arrive at Short Stay. Do not shave (including legs and underarms) for at least 48 hours prior to the first CHG shower.  You may shave your face/neck. Please follow these instructions carefully:  1.  Shower with CHG Soap the night before surgery and the  morning of Surgery.  2.  If you choose to wash your hair, wash your hair first as usual with your  normal  shampoo.  3.  After you shampoo, rinse your hair and body thoroughly to remove the  shampoo.                           4.  Use CHG as you would any other liquid soap.  You can apply chg directly  to the skin and wash                       Gently with a scrungie or clean washcloth.  5.  Apply the CHG Soap to your body ONLY FROM THE NECK DOWN.   Do not use on face/ open                           Wound or open sores. Avoid contact with eyes, ears mouth and genitals (private parts).                       Wash face,  Genitals (private parts) with your normal soap.             6.  Wash thoroughly, paying special attention to the area where your surgery  will be performed.  7.  Thoroughly rinse your body with warm water from the neck down.  8.  DO NOT shower/wash with your normal soap after using and rinsing off  the CHG Soap.  9.  Pat yourself dry with a clean towel.            10.  Wear clean pajamas.            11.  Place clean sheets on your bed the night of your first shower and do not  sleep with pets. Day of Surgery : Do not apply any lotions/deodorants the morning of surgery.  Please wear clean clothes to the hospital/surgery center.  FAILURE TO FOLLOW THESE INSTRUCTIONS MAY RESULT IN THE CANCELLATION OF YOUR SURGERY PATIENT SIGNATURE_________________________________  NURSE SIGNATURE__________________________________  ________________________________________________________________________

## 2019-01-03 ENCOUNTER — Other Ambulatory Visit: Payer: Self-pay

## 2019-01-03 ENCOUNTER — Encounter (HOSPITAL_COMMUNITY): Payer: Self-pay

## 2019-01-03 ENCOUNTER — Encounter (HOSPITAL_COMMUNITY): Payer: Self-pay | Admitting: Surgery

## 2019-01-03 ENCOUNTER — Encounter (HOSPITAL_COMMUNITY)
Admission: RE | Admit: 2019-01-03 | Discharge: 2019-01-03 | Disposition: A | Discharge status: Home / Self Care | Payer: Medicare Other | Source: Ambulatory Visit | Attending: Surgery | Admitting: Surgery

## 2019-01-03 DIAGNOSIS — Z01818 Encounter for other preprocedural examination: Secondary | ICD-10-CM | POA: Insufficient documentation

## 2019-01-03 DIAGNOSIS — I10 Essential (primary) hypertension: Secondary | ICD-10-CM

## 2019-01-03 HISTORY — DX: Headache, unspecified: R51.9

## 2019-01-03 HISTORY — DX: Headache: R51

## 2019-01-03 LAB — CBC
HCT: 36.5 % — ABNORMAL LOW (ref 39.0–52.0)
HEMOGLOBIN: 11.6 g/dL — AB (ref 13.0–17.0)
MCH: 31.2 pg (ref 26.0–34.0)
MCHC: 31.8 g/dL (ref 30.0–36.0)
MCV: 98.1 fL (ref 80.0–100.0)
NRBC: 0 % (ref 0.0–0.2)
Platelets: 203 10*3/uL (ref 150–400)
RBC: 3.72 MIL/uL — AB (ref 4.22–5.81)
RDW: 13.5 % (ref 11.5–15.5)
WBC: 3.3 10*3/uL — ABNORMAL LOW (ref 4.0–10.5)

## 2019-01-03 LAB — BASIC METABOLIC PANEL
ANION GAP: 7 (ref 5–15)
BUN: 17 mg/dL (ref 6–20)
CO2: 26 mmol/L (ref 22–32)
Calcium: 9 mg/dL (ref 8.9–10.3)
Chloride: 104 mmol/L (ref 98–111)
Creatinine, Ser: 1.2 mg/dL (ref 0.61–1.24)
GFR calc non Af Amer: >60 mL/min (ref >60)
Glucose, Bld: 113 mg/dL — ABNORMAL HIGH (ref 70–99)
Potassium: 3.4 mmol/L — ABNORMAL LOW (ref 3.5–5.1)
Sodium: 137 mmol/L (ref 135–145)

## 2019-01-03 LAB — GLUCOSE, CAPILLARY: Glucose-Capillary: 98 mg/dL (ref 70–99)

## 2019-01-03 LAB — HEMOGLOBIN A1C
Hgb A1c MFr Bld: 5.5 % (ref 4.8–5.6)
Mean Plasma Glucose: 111.15 mg/dL

## 2019-01-03 NOTE — Progress Notes (Signed)
Anesthesia Chart Review   Case:  [id number] Date/Time:  01/04/19 1245   Procedure:  LAPAROSCOPIC LEFT ADRENALECTOMY (Left )   Anesthesia type:  General   Pre-op diagnosis:  HYPERALDOSTERNOISM AND LEFT ADRENAL ADENOMA   Location:  [facility] ROOM 04 / [facility]   Surgeon:  Armandina Gemma, MD      DISCUSSION: 53yo former smoker (quit 08/30/97) with h/o HTN, asthma, DM II, CAD, COPD, asymptomatic significant umbilical hernia, hyperaldosteronism and left adrenal adenoma scheduled for above procedure 01/04/19 with Dr. Armandina Gemma.   Pt last seen by cardiologist, Dr. Ninfa Meeker, 12/22/18.  Pt seen by cardiologist regarding tachycardia, HTN, and hypokalemia attributed to left adrenal adenoma.  Per OV note, "We will see him quickly after his surgery as he will likely need many of his medications to be adjusted.  Pt aware of this."  Pt can proceed with planned procedure barring acute status change.  VS: BP 139/88   Pulse 75   Temp 36.4 C (Oral)   Resp 16   Ht _0  (1.753 m)   Wt 83.9 kg   SpO2 100%   BMI 27.32 kg/m   PROVIDERS: Abran Richard, MD is PCP   Loni Beckwith, MD is Endocrinologist   Janith Lima, MD is Cardiologist  LABS: Labs reviewed: Acceptable for surgery. (all labs ordered are listed, but only abnormal results are displayed)  Labs Reviewed  BASIC METABOLIC PANEL - Abnormal; Notable for the following components:      Result Value   Potassium 3.4 (*)    Glucose, Bld 113 (*)    All other components within normal limits  CBC - Abnormal; Notable for the following components:   WBC 3.3 (*)    RBC 3.72 (*)    Hemoglobin 11.6 (*)    HCT 36.5 (*)    All other components within normal limits  GLUCOSE, CAPILLARY  HEMOGLOBIN A1C     IMAGES:   EKG: 01/03/2019 Rate 78 bpm Sinus rhythm with occasional premature ventricular complexes Moderate voltage criteria for LVH, may be normal variant Nonspecific T wave abnormality Abnormal ECG  CV: Echo 11/22/2018 (on chart) 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 Asthma   . Bronchitis   . COPD (chronic obstructive pulmonary disease) ([place])   . Coronary artery disease   . Diabetes mellitus without complication ([place])   . Headache   . High cholesterol   . Hypertension     Past Surgical History:  Procedure Laterality Date  . NO PAST SURGERIES      MEDICATIONS: . albuterol (PROVENTIL HFA;VENTOLIN HFA) 108 (90 BASE) MCG/ACT inhaler  . amLODipine (NORVASC) 10 MG tablet  . Ascorbic Acid (VITAMIN C) 1000 MG tablet  . aspirin EC 81 MG tablet  . atorvastatin (LIPITOR) 20 MG tablet  . carvedilol (COREG) 25 MG tablet  . cloNIDine (CATAPRES - DOSED IN MG/24 HR) 0.3 mg/24hr patch  . colchicine 0.6 MG tablet  . fexofenadine (ALLEGRA) 180 MG tablet  . hydrALAZINE (APRESOLINE) 100 MG tablet  . ipratropium-albuterol (DUONEB) 0.5-2.5 (3) MG/3ML SOLN  . isosorbide mononitrate (IMDUR) 60 MG 24 hr tablet  . metFORMIN (GLUCOPHAGE) 1000 MG tablet  . montelukast (SINGULAIR) 10 MG tablet  . naproxen sodium (ALEVE) 220 MG tablet  .  olmesartan (BENICAR) 40 MG tablet  . omeprazole (PRILOSEC) 20 MG capsule  . oxymetazoline (AFRIN) 0.05 % nasal spray  . potassium chloride SA (K-DUR,KLOR-CON) 20 MEQ tablet  . ranolazine (RANEXA) 500 MG 12 hr tablet  . sucralfate (CARAFATE) 1 g tablet   No current facility-administered medications for this encounter.     Maia Plan [facility] Pre-Surgical Testing [phone] 01/03/19 11:08 AM

## 2019-01-03 NOTE — H&P (Signed)
General Surgery [facility]  Chaunce Winkels DOB: 10-13-1966 Single / Language: Cleophus Molt / Race: Black or African American Male   History of Present Illness  The patient is a 53 year old male who presents wtih an adrenal mass.  CHIEF COMPLAINT: hyperaldosteronism, Conn's tumor  Patient is referred by Dr. Bud Face for surgical evaluation and management of hyperaldosteronism with a left adrenal adenoma. Patient's cardiologist is Dr. Janith Lima in [place], [place]. Patient has long-standing poorly controlled hypertension. He also has significant hypokalemia. Patient was referred to his endocrinologist and underwent evaluation including multiple laboratory studies. He was found to have a significantly elevated ALDO/PRA ratio. Patient underwent CT scan on September 28, 2018 which shows a 1.7 cm left adrenal adenoma. Right adrenal gland was normal. Patient is now referred for consideration for left adrenalectomy for management of hyperaldosteronism. Patient has had no prior abdominal surgery. He currently takes significant potassium replacement every day. He has had uncontrolled hypertension despite multi-medication regimens. There is no family history of endocrine neoplasms. Patient presents today accompanied by his sister to discuss left adrenalectomy.   Past Surgical History  No pertinent past surgical history   Allergies  No Known Allergies [11/20/2018]: No Known Drug Allergies [11/20/2018]: Allergies Reconciled   Medication History Ventolin HFA (108 (90 Base)MCG/ACT Aerosol Soln, Inhalation) Active. Sucralfate (1GM Tablet, Oral) Active. Ranolazine ER (500MG  Tablet ER 12HR, Oral) Active. Omeprazole (20MG  Capsule DR, Oral) Active. Olmesartan Medoxomil (40MG  Tablet, Oral) Active. Potassium Chloride Crys ER (20MEQ Tablet ER, Oral) Active. metFORMIN HCl (1000MG  Tablet, Oral) Active. Isosorbide Mononitrate ER (60MG  Tablet ER 24HR, Oral) Active. Ibuprofen  (400MG  Tablet, Oral) Active. Montelukast Sodium (10MG  Tablet, Oral) Active. hydrALAZINE HCl (100MG  Tablet, Oral) Active. Clotrimazole (1% Cream, External) Active. cloNIDine (0.3MG /24HR Patch Weekly, Transdermal) Active. Carvedilol (25MG  Tablet, Oral) Active. amLODIPine Besylate (10MG  Tablet, Oral) Active. Atorvastatin Calcium (20MG  Tablet, Oral) Active. Bystolic (10MG  Tablet, Oral) Active. Cephalexin (500MG  Capsule, Oral) Active. Fexofenadine HCl (180MG  Tablet, Oral) Active. Colcrys (0.6MG  Tablet, Oral) Active. Medications Reconciled  Social History Alcohol use  Remotely quit alcohol use. Caffeine use  Carbonated beverages. Tobacco use  Former smoker.  Other Problems Arthritis  Bladder Problems  Congestive Heart Failure  Gastroesophageal Reflux Disease     Review of Systems General Present- Weight Loss. Not Present- Appetite Loss, Chills, Fatigue, Fever, Night Sweats and Weight Gain. HEENT Present- Seasonal Allergies and Wears glasses/contact lenses. Not Present- Earache, Hearing Loss, Hoarseness, Nose Bleed, Oral Ulcers, Ringing in the Ears, Sinus Pain, Sore Throat, Visual Disturbances and Yellow Eyes. Cardiovascular Present- Chest Pain, Difficulty Breathing Lying Down, Palpitations, Rapid Heart Rate and Shortness of Breath. Not Present- Leg Cramps and Swelling of Extremities. Gastrointestinal Present- Bloating, Change in Bowel Habits, Excessive gas, Gets full quickly at meals and Vomiting. Not Present- Abdominal Pain, Bloody Stool, Chronic diarrhea, Constipation, Difficulty Swallowing, Hemorrhoids, Indigestion, Nausea and Rectal Pain.  Vitals Weight: 185.25 lb Height: 70in Body Surface Area: 2.02 m Body Mass Index: 26.58 kg/m  Temp.: 97.17F(Temporal)  Pulse: 98 (Regular)  P.OX: 97% (Room air) BP: 168/98 (Sitting, Left Arm, Standard)  Physical Exam   See vital signs recorded above  GENERAL APPEARANCE Development: normal Nutritional  status: normal Gross deformities: none  SKIN Rash, lesions, ulcers: none Induration, erythema: none Nodules: none palpable  EYES Conjunctiva and lids: normal Pupils: equal and reactive Iris: normal bilaterally  EARS, NOSE, MOUTH, THROAT External ears: no lesion or deformity External nose: no lesion or deformity Hearing: grossly normal Lips: no lesion or deformity Dentition:  normal for age Oral mucosa: moist  NECK Symmetric: yes Trachea: midline Thyroid: no palpable nodules in the thyroid bed  CHEST Respiratory effort: normal Retraction or accessory muscle use: no Breath sounds: normal bilaterally Rales, rhonchi, wheeze: none  CARDIOVASCULAR Auscultation: regular rhythm, normal rate Murmurs: none Pulses: carotid and radial pulse 2+ palpable Lower extremity edema: none Lower extremity varicosities: none  ABDOMEN Distension: none Masses: none palpable Tenderness: none Hepatosplenomegaly: not present Hernia: Moderate umbilical hernia, reducible, nontender  MUSCULOSKELETAL Station and gait: normal Digits and nails: no clubbing or cyanosis Muscle strength: grossly normal all extremities Range of motion: grossly normal all extremities Deformity: none  LYMPHATIC Cervical: none palpable Supraclavicular: none palpable  PSYCHIATRIC Oriented to person, place, and time: yes Mood and affect: normal for situation Judgment and insight: appropriate for situation    Assessment & Plan  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 Dorris Fetch for surgical evaluation and management of primary hyperaldosteronism arising from the left adrenal adenoma. Patient is accompanied by his sister.  We discussed the excellent evaluation which the patient received from his endocrinologist. We discussed the symptoms of hyperaldosteronism be significant hypokalemia and difficult to control hypertension. We discussed the anatomy of the adrenal glands and the fact that  his right adrenal gland is normal. However there is a 1.7 cm adenoma in the left adrenal gland which is most likely secreting aldosterone. Patient is a good candidate for laparoscopic left adrenalectomy. We discussed the surgical procedure. We discussed the location of the incisions. We discussed the hospital stay to be anticipated. We discussed the potential need for conversion to open surgery. The patient and his sister understand and wish to proceed with surgery in the near future.  Patient does have a significant umbilical hernia which is largely asymptomatic. We will plan to repair that at a future date as an outpatient surgical procedure.  The risks and benefits of the procedure have been discussed at length with the patient. The patient understands the proposed procedure, potential alternative treatments, and the course of recovery to be expected. All of the patient's questions have been answered at this time. The patient wishes to proceed with surgery.  Armandina Gemma, [place] [facility] Office: [phone]

## 2019-01-04 ENCOUNTER — Encounter (HOSPITAL_COMMUNITY)
Admission: RE | Disposition: A | Discharge status: Home / Self Care | Payer: Self-pay | Source: Home / Self Care | Attending: Surgery

## 2019-01-04 ENCOUNTER — Encounter (HOSPITAL_COMMUNITY): Payer: Self-pay | Admitting: *Deleted

## 2019-01-04 ENCOUNTER — Inpatient Hospital Stay (HOSPITAL_COMMUNITY): Payer: Medicare Other | Admitting: Certified Registered Nurse Anesthetist

## 2019-01-04 ENCOUNTER — Inpatient Hospital Stay (HOSPITAL_COMMUNITY)
Admission: RE | Admit: 2019-01-04 | Discharge: 2019-01-06 | DRG: 615 | Disposition: A | Discharge status: Home / Self Care | Payer: Medicare Other | Attending: Surgery | Admitting: Surgery

## 2019-01-04 DIAGNOSIS — Z791 Long term (current) use of non-steroidal anti-inflammatories (NSAID): Secondary | ICD-10-CM

## 2019-01-04 DIAGNOSIS — Z79891 Long term (current) use of opiate analgesic: Secondary | ICD-10-CM

## 2019-01-04 DIAGNOSIS — E876 Hypokalemia: Secondary | ICD-10-CM | POA: Diagnosis present

## 2019-01-04 DIAGNOSIS — Z87891 Personal history of nicotine dependence: Secondary | ICD-10-CM

## 2019-01-04 DIAGNOSIS — E2609 Other primary hyperaldosteronism: Secondary | ICD-10-CM | POA: Diagnosis present

## 2019-01-04 DIAGNOSIS — D3502 Benign neoplasm of left adrenal gland: Principal | ICD-10-CM | POA: Diagnosis present

## 2019-01-04 DIAGNOSIS — K219 Gastro-esophageal reflux disease without esophagitis: Secondary | ICD-10-CM | POA: Diagnosis present

## 2019-01-04 DIAGNOSIS — Z79899 Other long term (current) drug therapy: Secondary | ICD-10-CM | POA: Diagnosis not present

## 2019-01-04 DIAGNOSIS — K429 Umbilical hernia without obstruction or gangrene: Secondary | ICD-10-CM | POA: Diagnosis present

## 2019-01-04 DIAGNOSIS — I110 Hypertensive heart disease with heart failure: Secondary | ICD-10-CM | POA: Diagnosis present

## 2019-01-04 DIAGNOSIS — I509 Heart failure, unspecified: Secondary | ICD-10-CM | POA: Diagnosis present

## 2019-01-04 DIAGNOSIS — E269 Hyperaldosteronism, unspecified: Secondary | ICD-10-CM

## 2019-01-04 HISTORY — PX: LAPAROSCOPIC ADRENALECTOMY: SHX999

## 2019-01-04 LAB — TYPE AND SCREEN
ABO/RH(D): A POS
Antibody Screen: NEGATIVE

## 2019-01-04 LAB — GLUCOSE, CAPILLARY
Glucose-Capillary: 119 mg/dL — ABNORMAL HIGH (ref 70–99)
Glucose-Capillary: 160 mg/dL — ABNORMAL HIGH (ref 70–99)
Glucose-Capillary: 160 mg/dL — ABNORMAL HIGH (ref 70–99)

## 2019-01-04 LAB — ABO/RH: ABO/RH(D): A POS

## 2019-01-04 SURGERY — ADRENALECTOMY, LAPAROSCOPIC
Anesthesia: General | Site: Abdomen | Laterality: Left

## 2019-01-04 MED ORDER — MIDAZOLAM HCL 5 MG/5ML IJ SOLN
INTRAMUSCULAR | Status: DC | PRN
  Administered 2019-01-04: 2 mg via INTRAVENOUS

## 2019-01-04 MED ORDER — ONDANSETRON 4 MG PO TBDP
4.0000 mg | ORAL_TABLET | Freq: Four times a day (QID) | ORAL | Status: DC | PRN
  Administered 2019-01-06: 4 mg via ORAL
  Filled 2019-01-04: qty 1

## 2019-01-04 MED ORDER — MIDAZOLAM HCL 2 MG/2ML IJ SOLN
INTRAMUSCULAR | Status: AC
  Filled 2019-01-04: qty 2

## 2019-01-04 MED ORDER — FENTANYL CITRATE (PF) 100 MCG/2ML IJ SOLN: 50.0000 ug | INTRAMUSCULAR | Status: DC

## 2019-01-04 MED ORDER — ROCURONIUM BROMIDE 100 MG/10ML IV SOLN
INTRAVENOUS | Status: DC | PRN
  Administered 2019-01-04 (×2): 10 mg via INTRAVENOUS
  Administered 2019-01-04: 80 mg via INTRAVENOUS
  Administered 2019-01-04: 10 mg via INTRAVENOUS

## 2019-01-04 MED ORDER — PROPOFOL 10 MG/ML IV BOLUS
INTRAVENOUS | Status: DC | PRN
  Administered 2019-01-04: 150 mg via INTRAVENOUS

## 2019-01-04 MED ORDER — FENTANYL CITRATE (PF) 100 MCG/2ML IJ SOLN: INTRAMUSCULAR | Status: DC | PRN

## 2019-01-04 MED ORDER — LIDOCAINE HCL (CARDIAC) PF 100 MG/5ML IV SOSY
PREFILLED_SYRINGE | INTRAVENOUS | Status: DC | PRN
  Administered 2019-01-04: 60 mg via INTRAVENOUS

## 2019-01-04 MED ORDER — CEFAZOLIN SODIUM-DEXTROSE 2-4 GM/100ML-% IV SOLN
2.0000 g | INTRAVENOUS | Status: AC
  Administered 2019-01-04: 2 g via INTRAVENOUS
  Filled 2019-01-04: qty 100

## 2019-01-04 MED ORDER — LACTATED RINGERS IR SOLN
Status: DC | PRN
  Administered 2019-01-04: 1000 mL

## 2019-01-04 MED ORDER — ACETAMINOPHEN 325 MG PO TABS: 650.0000 mg | ORAL_TABLET | Freq: Four times a day (QID) | ORAL | Status: DC | PRN

## 2019-01-04 MED ORDER — IRBESARTAN 300 MG PO TABS
300.0000 mg | ORAL_TABLET | Freq: Every day | ORAL | Status: DC
  Administered 2019-01-04 – 2019-01-06 (×3): 300 mg via ORAL
  Filled 2019-01-04 (×3): qty 1

## 2019-01-04 MED ORDER — ALBUTEROL SULFATE (2.5 MG/3ML) 0.083% IN NEBU: 3.0000 mL | INHALATION_SOLUTION | RESPIRATORY_TRACT | Status: DC | PRN

## 2019-01-04 MED ORDER — MONTELUKAST SODIUM 10 MG PO TABS
10.0000 mg | ORAL_TABLET | Freq: Every day | ORAL | Status: DC
  Administered 2019-01-05 – 2019-01-06 (×2): 10 mg via ORAL
  Filled 2019-01-04 (×2): qty 1

## 2019-01-04 MED ORDER — LACTATED RINGERS IV SOLN
INTRAVENOUS | Status: DC
  Administered 2019-01-04: 13:00:00 via INTRAVENOUS

## 2019-01-04 MED ORDER — OXYCODONE HCL 5 MG PO TABS
5.0000 mg | ORAL_TABLET | ORAL | Status: DC | PRN
  Administered 2019-01-04: 5 mg via ORAL
  Administered 2019-01-05 – 2019-01-06 (×3): 10 mg via ORAL
  Filled 2019-01-04: qty 2
  Filled 2019-01-04: qty 1
  Filled 2019-01-04 (×2): qty 2

## 2019-01-04 MED ORDER — ONDANSETRON HCL 4 MG/2ML IJ SOLN
INTRAMUSCULAR | Status: AC
  Filled 2019-01-04: qty 2

## 2019-01-04 MED ORDER — FENTANYL CITRATE (PF) 100 MCG/2ML IJ SOLN
INTRAMUSCULAR | Status: AC
  Filled 2019-01-04: qty 4

## 2019-01-04 MED ORDER — IPRATROPIUM-ALBUTEROL 0.5-2.5 (3) MG/3ML IN SOLN
3.0000 mL | Freq: Three times a day (TID) | RESPIRATORY_TRACT | Status: DC
  Administered 2019-01-04: 3 mL via RESPIRATORY_TRACT
  Filled 2019-01-04: qty 3

## 2019-01-04 MED ORDER — CARVEDILOL 25 MG PO TABS
25.0000 mg | ORAL_TABLET | Freq: Two times a day (BID) | ORAL | Status: DC
  Administered 2019-01-05 – 2019-01-06 (×4): 25 mg via ORAL
  Filled 2019-01-04 (×4): qty 1

## 2019-01-04 MED ORDER — ROCURONIUM BROMIDE 10 MG/ML (PF) SYRINGE
PREFILLED_SYRINGE | INTRAVENOUS | Status: AC
  Filled 2019-01-04: qty 10

## 2019-01-04 MED ORDER — FENTANYL CITRATE (PF) 250 MCG/5ML IJ SOLN
INTRAMUSCULAR | Status: DC | PRN
  Administered 2019-01-04 (×5): 50 ug via INTRAVENOUS

## 2019-01-04 MED ORDER — LACTATED RINGERS IV SOLN
INTRAVENOUS | Status: DC
  Administered 2019-01-04 (×2): via INTRAVENOUS

## 2019-01-04 MED ORDER — OXYCODONE HCL 5 MG PO TABS: 5.0000 mg | ORAL_TABLET | Freq: Once | ORAL | Status: DC | PRN

## 2019-01-04 MED ORDER — 0.9 % SODIUM CHLORIDE (POUR BTL) OPTIME
TOPICAL | Status: DC | PRN
  Administered 2019-01-04: 1000 mL

## 2019-01-04 MED ORDER — SUCRALFATE 1 G PO TABS
1.0000 g | ORAL_TABLET | Freq: Four times a day (QID) | ORAL | Status: DC
  Administered 2019-01-04 – 2019-01-06 (×8): 1 g via ORAL
  Filled 2019-01-04 (×8): qty 1

## 2019-01-04 MED ORDER — LIDOCAINE 2% (20 MG/ML) 5 ML SYRINGE
INTRAMUSCULAR | Status: AC
  Filled 2019-01-04: qty 5

## 2019-01-04 MED ORDER — COLCHICINE 0.6 MG PO TABS
0.6000 mg | ORAL_TABLET | Freq: Two times a day (BID) | ORAL | Status: DC
  Administered 2019-01-04 – 2019-01-06 (×4): 0.6 mg via ORAL
  Filled 2019-01-04 (×4): qty 1

## 2019-01-04 MED ORDER — FENTANYL CITRATE (PF) 250 MCG/5ML IJ SOLN
INTRAMUSCULAR | Status: AC
  Filled 2019-01-04: qty 5

## 2019-01-04 MED ORDER — POTASSIUM CHLORIDE CRYS ER 20 MEQ PO TBCR
20.0000 meq | EXTENDED_RELEASE_TABLET | Freq: Three times a day (TID) | ORAL | Status: DC
  Administered 2019-01-04 – 2019-01-06 (×6): 20 meq via ORAL
  Filled 2019-01-04 (×6): qty 1

## 2019-01-04 MED ORDER — MIDAZOLAM HCL 2 MG/2ML IJ SOLN: 1.0000 mg | INTRAMUSCULAR | Status: DC

## 2019-01-04 MED ORDER — ONDANSETRON HCL 4 MG/2ML IJ SOLN
INTRAMUSCULAR | Status: DC | PRN
  Administered 2019-01-04: 4 mg via INTRAVENOUS

## 2019-01-04 MED ORDER — DEXAMETHASONE SODIUM PHOSPHATE 10 MG/ML IJ SOLN
INTRAMUSCULAR | Status: DC | PRN
  Administered 2019-01-04: 10 mg via INTRAVENOUS

## 2019-01-04 MED ORDER — SUGAMMADEX SODIUM 200 MG/2ML IV SOLN
INTRAVENOUS | Status: AC
  Filled 2019-01-04: qty 2

## 2019-01-04 MED ORDER — TRAMADOL HCL 50 MG PO TABS: 50.0000 mg | ORAL_TABLET | Freq: Four times a day (QID) | ORAL | Status: DC | PRN

## 2019-01-04 MED ORDER — ACETAMINOPHEN 10 MG/ML IV SOLN: 1000.0000 mg | Freq: Once | INTRAVENOUS | Status: DC | PRN

## 2019-01-04 MED ORDER — ACETAMINOPHEN 500 MG PO TABS: 1000.0000 mg | ORAL_TABLET | Freq: Once | ORAL | Status: DC | PRN

## 2019-01-04 MED ORDER — ISOSORBIDE MONONITRATE ER 60 MG PO TB24
60.0000 mg | ORAL_TABLET | Freq: Two times a day (BID) | ORAL | Status: DC
  Administered 2019-01-04 – 2019-01-06 (×4): 60 mg via ORAL
  Filled 2019-01-04 (×4): qty 1

## 2019-01-04 MED ORDER — METFORMIN HCL 500 MG PO TABS
1000.0000 mg | ORAL_TABLET | Freq: Two times a day (BID) | ORAL | Status: DC
  Administered 2019-01-05 – 2019-01-06 (×4): 1000 mg via ORAL
  Filled 2019-01-04 (×4): qty 2

## 2019-01-04 MED ORDER — DEXAMETHASONE SODIUM PHOSPHATE 10 MG/ML IJ SOLN
INTRAMUSCULAR | Status: AC
  Filled 2019-01-04: qty 1

## 2019-01-04 MED ORDER — IPRATROPIUM-ALBUTEROL 0.5-2.5 (3) MG/3ML IN SOLN: 3.0000 mL | RESPIRATORY_TRACT | Status: DC | PRN

## 2019-01-04 MED ORDER — SUGAMMADEX SODIUM 500 MG/5ML IV SOLN
INTRAVENOUS | Status: DC | PRN
  Administered 2019-01-04: 350 mg via INTRAVENOUS

## 2019-01-04 MED ORDER — ACETAMINOPHEN 160 MG/5ML PO SOLN: 1000.0000 mg | Freq: Once | ORAL | Status: DC | PRN

## 2019-01-04 MED ORDER — ONDANSETRON HCL 4 MG/2ML IJ SOLN
4.0000 mg | Freq: Four times a day (QID) | INTRAMUSCULAR | Status: DC | PRN
  Administered 2019-01-05: 4 mg via INTRAVENOUS
  Filled 2019-01-04: qty 2

## 2019-01-04 MED ORDER — CHLORHEXIDINE GLUCONATE CLOTH 2 % EX PADS
6.0000 | MEDICATED_PAD | Freq: Once | CUTANEOUS | Status: AC
  Administered 2019-01-04: 6 via TOPICAL

## 2019-01-04 MED ORDER — SUGAMMADEX SODIUM 500 MG/5ML IV SOLN
INTRAVENOUS | Status: AC
  Filled 2019-01-04: qty 5

## 2019-01-04 MED ORDER — CLONIDINE HCL 0.3 MG/24HR TD PTWK: 0.3000 mg | MEDICATED_PATCH | TRANSDERMAL | Status: DC

## 2019-01-04 MED ORDER — RANOLAZINE ER 500 MG PO TB12
500.0000 mg | ORAL_TABLET | Freq: Two times a day (BID) | ORAL | Status: DC
  Administered 2019-01-04 – 2019-01-06 (×4): 500 mg via ORAL
  Filled 2019-01-04 (×5): qty 1

## 2019-01-04 MED ORDER — KCL IN DEXTROSE-NACL 20-5-0.45 MEQ/L-%-% IV SOLN
INTRAVENOUS | Status: DC
  Administered 2019-01-04 – 2019-01-05 (×3): via INTRAVENOUS
  Filled 2019-01-04 (×4): qty 1000

## 2019-01-04 MED ORDER — ACETAMINOPHEN 650 MG RE SUPP: 650.0000 mg | Freq: Four times a day (QID) | RECTAL | Status: DC | PRN

## 2019-01-04 MED ORDER — HYDROMORPHONE HCL 1 MG/ML IJ SOLN
1.0000 mg | INTRAMUSCULAR | Status: DC | PRN
  Administered 2019-01-04: 1 mg via INTRAVENOUS
  Administered 2019-01-04 – 2019-01-06 (×5): 2 mg via INTRAVENOUS
  Filled 2019-01-04: qty 2
  Filled 2019-01-04: qty 1
  Filled 2019-01-04 (×4): qty 2

## 2019-01-04 MED ORDER — FENTANYL CITRATE (PF) 100 MCG/2ML IJ SOLN
25.0000 ug | INTRAMUSCULAR | Status: DC | PRN
  Administered 2019-01-04 (×3): 50 ug via INTRAVENOUS

## 2019-01-04 MED ORDER — PROPOFOL 10 MG/ML IV BOLUS
INTRAVENOUS | Status: AC
  Filled 2019-01-04: qty 20

## 2019-01-04 MED ORDER — OXYCODONE HCL 5 MG/5ML PO SOLN: 5.0000 mg | Freq: Once | ORAL | Status: DC | PRN

## 2019-01-04 MED ORDER — AMLODIPINE BESYLATE 10 MG PO TABS
10.0000 mg | ORAL_TABLET | Freq: Every day | ORAL | Status: DC
  Administered 2019-01-05 – 2019-01-06 (×2): 10 mg via ORAL
  Filled 2019-01-04 (×2): qty 1

## 2019-01-04 MED ORDER — BUPIVACAINE-EPINEPHRINE (PF) 0.25% -1:200000 IJ SOLN
INTRAMUSCULAR | Status: AC
  Filled 2019-01-04: qty 30

## 2019-01-04 MED ORDER — BUPIVACAINE-EPINEPHRINE 0.25% -1:200000 IJ SOLN
INTRAMUSCULAR | Status: DC | PRN
  Administered 2019-01-04: 20 mL

## 2019-01-04 SURGICAL SUPPLY — 62 items
ADH SKN CLS APL DERMABOND .7 (GAUZE/BANDAGES/DRESSINGS) ×1
APPLIER CLIP 50.8 LG (INSTRUMENTS) IMPLANT
APPLIER CLIP ROT 10 11.4 M/L (STAPLE)
APR CLP MED LRG 11.4X10 (STAPLE)
BAG LAPAROSCOPIC 12 15 PORT 16 (BASKET) IMPLANT
BAG RETRIEVAL 12/15 (BASKET)
BAG RETRIEVAL 12/15MM (BASKET)
BAG SPEC RTRVL LRG 6X4 10 (ENDOMECHANICALS) ×1
BLADE HEX COATED 2.75 (ELECTRODE) IMPLANT
CHLORAPREP W/TINT 26ML (MISCELLANEOUS) ×3 IMPLANT
CLIP APPLIE ROT 10 11.4 M/L (STAPLE) IMPLANT
CONNECTOR 5 IN 1 STRAIGHT STRL (MISCELLANEOUS) ×3 IMPLANT
COVER SURGICAL LIGHT HANDLE (MISCELLANEOUS) ×3 IMPLANT
COVER WAND RF STERILE (DRAPES) ×3 IMPLANT
DECANTER SPIKE VIAL GLASS SM (MISCELLANEOUS) ×3 IMPLANT
DERMABOND ADVANCED (GAUZE/BANDAGES/DRESSINGS) ×2
DERMABOND ADVANCED .7 DNX12 (GAUZE/BANDAGES/DRESSINGS) ×1 IMPLANT
DISSECTOR BLUNT TIP ENDO 5MM (MISCELLANEOUS) ×3 IMPLANT
DRAIN CHANNEL RND F F (WOUND CARE) IMPLANT
DRAPE LAPAROSCOPIC ABDOMINAL (DRAPES) ×3 IMPLANT
DRAPE WARM FLUID 44X44 (DRAPE) ×3 IMPLANT
ELECT PENCIL ROCKER SW 15FT (MISCELLANEOUS) ×3 IMPLANT
EVACUATOR SILICONE 100CC (DRAIN) IMPLANT
GLOVE BIOGEL PI IND STRL 7.0 (GLOVE) ×1 IMPLANT
GLOVE BIOGEL PI INDICATOR 7.0 (GLOVE) ×2
GLOVE SURG ORTHO 8.0 STRL STRW (GLOVE) ×3 IMPLANT
GOWN STRL REUS W/TWL LRG LVL3 (GOWN DISPOSABLE) ×3 IMPLANT
GOWN STRL REUS W/TWL XL LVL3 (GOWN DISPOSABLE) ×6 IMPLANT
GRASPER SUT TROCAR 14GX15 (MISCELLANEOUS) ×3 IMPLANT
KIT BASIN OR (CUSTOM PROCEDURE TRAY) ×3 IMPLANT
KIT TURNOVER KIT A (KITS) IMPLANT
NS IRRIG 1000ML POUR BTL (IV SOLUTION) ×3 IMPLANT
PAD POSITIONING PINK XL (MISCELLANEOUS) IMPLANT
POUCH SPECIMEN RETRIEVAL 10MM (ENDOMECHANICALS) ×3 IMPLANT
PROTECTOR NERVE ULNAR (MISCELLANEOUS) ×6 IMPLANT
RELOAD STAPLER BLUE 60MM (STAPLE) IMPLANT
RELOAD STAPLER WHITE 60MM (STAPLE) IMPLANT
SCISSORS LAP 5X35 DISP (ENDOMECHANICALS) ×3 IMPLANT
SET IRRIG TUBING LAPAROSCOPIC (IRRIGATION / IRRIGATOR) ×3 IMPLANT
SET TUBE SMOKE EVAC HIGH FLOW (TUBING) ×3 IMPLANT
SHEARS HARMONIC ACE PLUS 36CM (ENDOMECHANICALS) ×3 IMPLANT
SOLUTION ANTI FOG 6CC (MISCELLANEOUS) IMPLANT
SPONGE LAP 18X18 RF (DISPOSABLE) IMPLANT
STAPLER RELOAD BLUE 60MM (STAPLE)
STAPLER RELOAD WHITE 60MM (STAPLE)
STAPLER VISISTAT 35W (STAPLE) ×3 IMPLANT
SUT ETHILON 3 0 PS 1 (SUTURE) IMPLANT
SUT MNCRL AB 4-0 PS2 18 (SUTURE) ×3 IMPLANT
SYS LAPSCP GELPORT 120MM (MISCELLANEOUS)
SYSTEM LAPSCP GELPORT 120MM (MISCELLANEOUS) IMPLANT
TAPE CLOTH 4X10 WHT NS (GAUZE/BANDAGES/DRESSINGS) ×3 IMPLANT
TOWEL OR 17X26 10 PK STRL BLUE (TOWEL DISPOSABLE) ×3 IMPLANT
TRAY FOLEY MTR SLVR 16FR STAT (SET/KITS/TRAYS/PACK) ×3 IMPLANT
TRAY LAPAROSCOPIC (CUSTOM PROCEDURE TRAY) ×3 IMPLANT
TROCAR XCEL 12X100 BLDLESS (ENDOMECHANICALS) IMPLANT
TROCAR XCEL BLUNT TIP 100MML (ENDOMECHANICALS) IMPLANT
TROCAR XCEL NON-BLD 11X100MML (ENDOMECHANICALS) ×3 IMPLANT
TROCAR XCEL UNIV SLVE 11M 100M (ENDOMECHANICALS) ×3 IMPLANT
TUBING CONNECTING 10 (TUBING) ×2 IMPLANT
TUBING CONNECTING 10' (TUBING) ×1
WATER STERILE IRR 1000ML POUR (IV SOLUTION) ×3 IMPLANT
YANKAUER SUCT BULB TIP 10FT TU (MISCELLANEOUS) ×3 IMPLANT

## 2019-01-04 NOTE — Anesthesia Postprocedure Evaluation (Signed)
Anesthesia Post Note  Patient: Cody Shepard  Procedure(s) Performed: LAPAROSCOPIC LEFT ADRENALECTOMY (Left Abdomen)     Patient location during evaluation: PACU Anesthesia Type: General Level of consciousness: awake and alert Pain management: pain level controlled Vital Signs Assessment: post-procedure vital signs reviewed and stable Respiratory status: spontaneous breathing, nonlabored ventilation, respiratory function stable and patient connected to nasal cannula oxygen Cardiovascular status: blood pressure returned to baseline and stable Postop Assessment: no apparent nausea or vomiting Anesthetic complications: no    Last Vitals:  Vitals:   01/04/19 1800 01/04/19 1814  BP:  (!) 144/94  Pulse: 88 87  Resp:  20  Temp:  (!) 36.3 C  SpO2: 96% 98%    Last Pain:  Vitals:   01/04/19 1919  TempSrc:   PainSc: 5                  Cailen Texeira

## 2019-01-04 NOTE — Interval H&P Note (Signed)
History and Physical Interval Note:  01/04/2019 1:16 PM  Cody Shepard  has presented today for surgery, with the diagnosis of HYPERALDOSTERNOISM AND LEFT ADRENAL ADENOMA.  The various methods of treatment have been discussed with the patient and family. After consideration of risks, benefits and other options for treatment, the patient has consented to    Procedure(s): LAPAROSCOPIC LEFT ADRENALECTOMY (Left) as a surgical intervention.    The patient's history has been reviewed, patient examined, no change in status, stable for surgery.  I have reviewed the patient's chart and labs.  Questions were answered to the patient's satisfaction.     Armandina Gemma, [place] [facility] Office: [place]

## 2019-01-04 NOTE — Anesthesia Procedure Notes (Signed)
Procedure Name: Intubation Date/Time: 01/04/2019 2:03 PM Performed by: Glory Buff, CRNA Pre-anesthesia Checklist: Patient identified, Emergency Drugs available, Suction available and Patient being monitored Patient Re-evaluated:Patient Re-evaluated prior to induction Oxygen Delivery Method: Circle system utilized Preoxygenation: Pre-oxygenation with 100% oxygen Induction Type: IV induction Ventilation: Mask ventilation without difficulty Laryngoscope Size: Miller and 3 Grade View: Grade I Tube type: Oral Tube size: 7.5 mm Number of attempts: 1 Airway Equipment and Method: Stylet and Oral airway Placement Confirmation: ETT inserted through vocal cords under direct vision,  positive ETCO2 and breath sounds checked- equal and bilateral Secured at: 22 cm Tube secured with: Tape Dental Injury: Teeth and Oropharynx as per pre-operative assessment

## 2019-01-04 NOTE — Op Note (Signed)
Operative Note  Pre-operative Diagnosis:  Left adrenal adenoma (aldosteronoma)  Post-operative Diagnosis:  same  Surgeon:  Armandina Gemma, MD  Assistant:  Annye English, MD   Procedure:  Laparoscopic left adrenalectomy  Anesthesia:  general  Estimated Blood Loss:  < 50 cc  Drains: none         Specimen: left adrenal gland to pathology  Indications: Patient is a 53 year old male referred by his endocrinologist, Dr. Bud Face, for surgical management of left adrenal adenoma.  Patient has hyperaldosteronism.  CT scan demonstrated a 1.7 cm mass in the left adrenal.  After preparation, the patient now comes to surgery for left adrenalectomy.  Procedure Details:  The patient was seen in the pre-op holding area. The risks, benefits, complications, treatment options, and expected outcomes were previously discussed with the patient. The patient agreed with the proposed plan and has signed the informed consent form.  The patient was brought to the operating room by the surgical team, identified as Truman Hayward and the procedure verified. A "time out" was completed and the above information confirmed.  Following induction of general anesthesia, the patient was positioned on the operating room table in a right lateral decubitus position.  Beanbag was inflated and kidney rest was elevated.  Patient was then prepped and draped in the usual aseptic fashion.  After ascertaining that an adequate level of anesthesia been achieved, an incision is made in the mid left costal margin and a 5 mm Optiview trocar is advanced under direct vision into the peritoneal cavity.  Pneumoperitoneum is established.  A total of 4 ports are inserted along the left costal margin, 3 of them being 11 mm trochars.  These are inserted under direct vision.  The peritoneal attachments at the splenic flexure of the colon are mobilized to provide access to the left upper quadrant.  Using the harmonic scalpel the peritoneum lateral to the  spleen is incised taking care not to undermine the left kidney.  Dissection is carried around the spleen and cephalad and medial to the crus of the diaphragm.  With gentle blunt dissection the spleen is mobilized anteriorly and medially exposing the hilum of the left kidney.  Dissection is then begun medial to the upper pole of the kidney.  The adrenal gland is identified.  It is gently dissected out using the harmonic scalpel as necessary for hemostasis.  Dissection is carried down to the apex of the adrenal gland.  With gentle dissection in this area the left adrenal vein is identified.  It is dissected out circumferentially with a IT consultant.  The vein is then doubly clipped proximally and distally and divided with the harmonic scalpel.  Adrenal gland is then excised using the harmonic scalpel.  Care is taken to avoid the renal hilum.  The gland is placed into an Endo Catch bag and withdrawn through the lateral trocar site.  The entire gland is excised and placed on the back table for examination.  The parenchyma of the gland appears intact.  The gland is then incised longitudinally with a #15 blade demonstrating the 1.7 cm tumor which was cadmium yellow in color consistent with aldosteronoma.  The entire gland is submitted to pathology for review.  Left upper quadrant of the abdomen is irrigated with a small amount of saline which is evacuated.  Good hemostasis is noted.  The trocar site where the gland was removed is closed with a 0 Vicryl suture.  Other port sites are anesthetized with local anesthetic.  Skin  of all port sites are closed with interrupted 4-0 Monocryl subcuticular sutures.  Wounds are washed and dried and Dermabond is applied as dressing.  Patient is awakened from anesthesia and transferred to the recovery room in stable condition.  The patient tolerated the procedure well[place] [id number] [facility] Office: [phone]

## 2019-01-04 NOTE — Anesthesia Preprocedure Evaluation (Addendum)
Anesthesia Evaluation  Patient identified by MRN, date of birth, ID band Patient awake    Reviewed: Allergy & Precautions, NPO status , Patient's Chart, lab work & pertinent test results, reviewed documented beta blocker date and time   History of Anesthesia Complications Negative for: history of anesthetic complications  Airway Mallampati: I  TM Distance: >3 FB Neck ROM: Full    Dental  (+) Teeth Intact, Missing,    Pulmonary asthma , COPD, former smoker,    breath sounds clear to auscultation       Cardiovascular hypertension, Pt. on medications and Pt. on home beta blockers (-) angina+ CAD  (-) Past MI and (-) CHF (-) dysrhythmias  Rhythm:Regular     Neuro/Psych  Headaches, negative psych ROS   GI/Hepatic negative GI ROS, Neg liver ROS,   Endo/Other  diabetes, Type 2Hyperaldosteronism with adrenal adenoma   Renal/GU negative Renal ROS     Musculoskeletal   Abdominal   Peds  Hematology negative hematology ROS (+)   Anesthesia Other Findings 2012 cath:   1. LV 129/9/14.  EF 60% without regional wall motion abnormality.  2. No aortic stenosis or mitral regurgitation.  3. Left main:  Angiographically normal.  4. LAD:  The LAD is a very large vessel which wraps around the apex of the     heart and gives rise to a single large diagonal.  It is angiographically     normal.  5. Circumflex:  The circumflex is a moderate sized vessel giving rise to a     single large obtuse marginal.  The distal circumflex has a 20% stenosis.  6. RCA:  The RCA is a moderate sized, dominant vessel.  There is a 30%     stenosis in the mid vessel.  Reproductive/Obstetrics                            Anesthesia Physical Anesthesia Plan  ASA: III  Anesthesia Plan: General   Post-op Pain Management:    Induction: Intravenous  PONV Risk Score and Plan: 2 and Ondansetron and Dexamethasone  Airway Management  Planned: Oral ETT  Additional Equipment: Arterial line  Intra-op Plan:   Post-operative Plan: Extubation in OR  Informed Consent: I have reviewed the patients History and Physical, chart, labs and discussed the procedure including the risks, benefits and alternatives for the proposed anesthesia with the patient or authorized representative who has indicated his/her understanding and acceptance.     Dental advisory given  Plan Discussed with: CRNA and Surgeon  Anesthesia Plan Comments:         Anesthesia Quick Evaluation

## 2019-01-04 NOTE — Anesthesia Procedure Notes (Signed)
Arterial Line Insertion Start/End3/09/2019 1:40 PM, 01/04/2019 1:45 PM Performed by: Glory Buff, CRNA, CRNA  Patient location: Pre-op. Preanesthetic checklist: patient identified, IV checked, site marked, risks and benefits discussed, surgical consent, monitors and equipment checked, pre-op evaluation, timeout performed and anesthesia consent Lidocaine 1% used for infiltration and patient sedated Right, radial was placed Catheter size: 20 G Hand hygiene performed , maximum sterile barriers used  and Seldinger technique used Allen's test indicative of satisfactory collateral circulation Attempts: 1 Procedure performed without using ultrasound guided technique. Following insertion, dressing applied and Biopatch. Post procedure assessment: normal and unchanged  Patient tolerated the procedure well with no immediate complications.

## 2019-01-04 NOTE — Transfer of Care (Signed)
Immediate Anesthesia Transfer of Care Note  Patient: Cody Shepard  Procedure(s) Performed: Procedure(s): LAPAROSCOPIC LEFT ADRENALECTOMY (Left)  Patient Location: PACU  Anesthesia Type:General  Level of Consciousness:  sedated, patient cooperative and responds to stimulation  Airway & Oxygen Therapy:Patient Spontanous Breathing and Patient connected to face mask oxgen  Post-op Assessment:  Report given to PACU RN and Post -op Vital signs reviewed and stable  Post vital signs:  Reviewed and stable  Last Vitals:  Vitals:   01/04/19 1100  BP: 121/82  Pulse: 81  Resp: 18  Temp: 36.6 C  SpO2: 97%    Complications: No apparent anesthesia complications

## 2019-01-05 ENCOUNTER — Other Ambulatory Visit: Payer: Self-pay

## 2019-01-05 LAB — GLUCOSE, CAPILLARY
GLUCOSE-CAPILLARY: 134 mg/dL — AB (ref 70–99)
GLUCOSE-CAPILLARY: 132 mg/dL — AB (ref 70–99)
Glucose-Capillary: 111 mg/dL — ABNORMAL HIGH (ref 70–99)
Glucose-Capillary: 118 mg/dL — ABNORMAL HIGH (ref 70–99)

## 2019-01-05 LAB — BASIC METABOLIC PANEL
Anion gap: 11 (ref 5–15)
BUN: 14 mg/dL (ref 6–20)
CO2: 26 mmol/L (ref 22–32)
Calcium: 8.2 mg/dL — ABNORMAL LOW (ref 8.9–10.3)
Chloride: 97 mmol/L — ABNORMAL LOW (ref 98–111)
Creatinine, Ser: 1 mg/dL (ref 0.61–1.24)
Glucose, Bld: 194 mg/dL — ABNORMAL HIGH (ref 70–99)
Potassium: 3.1 mmol/L — ABNORMAL LOW (ref 3.5–5.1)
Sodium: 134 mmol/L — ABNORMAL LOW (ref 135–145)

## 2019-01-05 LAB — CBC
HCT: 36.6 % — ABNORMAL LOW (ref 39.0–52.0)
Hemoglobin: 11.6 g/dL — ABNORMAL LOW (ref 13.0–17.0)
MCH: 31.3 pg (ref 26.0–34.0)
MCHC: 31.7 g/dL (ref 30.0–36.0)
MCV: 98.7 fL (ref 80.0–100.0)
Platelets: 197 10*3/uL (ref 150–400)
RBC: 3.71 MIL/uL — ABNORMAL LOW (ref 4.22–5.81)
RDW: 13.2 % (ref 11.5–15.5)
WBC: 6 10*3/uL (ref 4.0–10.5)
nRBC: 0 % (ref 0.0–0.2)

## 2019-01-05 MED ORDER — POTASSIUM CHLORIDE 10 MEQ/100ML IV SOLN
10.0000 meq | INTRAVENOUS | Status: AC
  Administered 2019-01-05 (×3): 10 meq via INTRAVENOUS
  Filled 2019-01-05 (×3): qty 100

## 2019-01-05 NOTE — Progress Notes (Signed)
     Assessment & Plan: POD#1 - status post laparoscopic left adrenalectomy for aldosteronoma  Pain well controlled  Taking regular diet  Voiding  Ambulating in halls  Potassium 3.1 this AM - taking oral and will give 3 runs of IV  Check BMET in AM 3/14  Likely home tomorrow        Armandina Gemma, MD       [facility]       Office: [phone]   Chief Complaint: Left adrenal adenoma  Subjective: Patient in bed, comfortable.  Tolerating diet.  Ambulated in halls.  Voiding.  Objective: Vital signs in last 24 hours: Temp:  [97.3 F (36.3 C)-98 F (36.7 C)] 98 F (36.7 C) (03/13 0637) Pulse Rate:  [82-94] 83 (03/13 0637) Resp:  [13-20] 16 (03/13 0637) BP: (134-148)/(87-97) 148/95 (03/13 0637) SpO2:  [95 %-100 %] 99 % (03/13 7846) Arterial Line BP: (147-157)/(76-82) 157/82 (03/12 1715) Weight:  [83.9 kg-88.8 kg] 88.8 kg (03/12 1814) Last BM Date: 01/04/19  Intake/Output from previous day: 03/12 0701 - 03/13 0700 In: 2986.6 [I.V.:2886.6; IV Piggyback:100] Out: 3900 [Urine:3875; Blood:25] Intake/Output this shift: Total I/O In: 240 [P.O.:240] Out: 100 [Urine:100]  Physical Exam: HEENT - sclerae clear, mucous membranes moist Neck - soft Chest - clear bilaterally Cor - RRR Abdomen - soft, mild distension; wounds dry and intact; few BS present; mild tenderness, slight crepitance at wounds Ext - no edema, non-tender Neuro - alert & oriented, no focal deficits  Lab Results:  Recent Labs    01/03/19 0927 01/05/19 0313  WBC 3.3* 6.0  HGB 11.6* 11.6*  HCT 36.5* 36.6*  PLT 203 197   BMET Recent Labs    01/03/19 0927 01/05/19 0313  NA 137 134*  K 3.4* 3.1*  CL 104 97*  CO2 26 26  GLUCOSE 113* 194*  BUN 17 14  CREATININE 1.20 1.00  CALCIUM 9.0 8.2*   PT/INR No results for input(s): LABPROT, INR in the last 72 hours. Comprehensive Metabolic Panel:    Component Value Date/Time   NA 134 (L) 01/05/2019 0313   NA 137 01/03/2019 0927   K  3.1 (L) 01/05/2019 0313   K 3.4 (L) 01/03/2019 0927   CL 97 (L) 01/05/2019 0313   CL 104 01/03/2019 0927   CO2 26 01/05/2019 0313   CO2 26 01/03/2019 0927   BUN 14 01/05/2019 0313   BUN 17 01/03/2019 0927   CREATININE 1.00 01/05/2019 0313   CREATININE 1.20 01/03/2019 0927   GLUCOSE 194 (H) 01/05/2019 0313   GLUCOSE 113 (H) 01/03/2019 0927   CALCIUM 8.2 (L) 01/05/2019 0313   CALCIUM 9.0 01/03/2019 0927   AST 63 (H) 10/04/2018 1034   AST 24 08/10/2018 0808   ALT 69 (H) 10/04/2018 1034   ALT 28 08/10/2018 0808   ALKPHOS 48 10/04/2018 1034   ALKPHOS 51 08/10/2018 0808   BILITOT 1.0 10/04/2018 1034   BILITOT 0.9 08/10/2018 0808   PROT 8.1 10/04/2018 1034   PROT 7.7 08/10/2018 0808   ALBUMIN 4.3 10/04/2018 1034   ALBUMIN 3.9 08/10/2018 0808    Studies/Results: No results found.    Armandina Gemma 01/05/2019  Patient ID: Cody Shepard, male   DOB: 10-12-1966, 53 y.o.   MRN: [id number]

## 2019-01-06 LAB — BASIC METABOLIC PANEL
Anion gap: 7 (ref 5–15)
BUN: 12 mg/dL (ref 6–20)
CO2: 27 mmol/L (ref 22–32)
CREATININE: 0.97 mg/dL (ref 0.61–1.24)
Calcium: 8.4 mg/dL — ABNORMAL LOW (ref 8.9–10.3)
Chloride: 105 mmol/L (ref 98–111)
GFR calc non Af Amer: >60 mL/min (ref >60)
Glucose, Bld: 104 mg/dL — ABNORMAL HIGH (ref 70–99)
Potassium: 3.7 mmol/L (ref 3.5–5.1)
Sodium: 139 mmol/L (ref 135–145)

## 2019-01-06 LAB — GLUCOSE, CAPILLARY
Glucose-Capillary: 110 mg/dL — ABNORMAL HIGH (ref 70–99)
Glucose-Capillary: 103 mg/dL — ABNORMAL HIGH (ref 70–99)

## 2019-01-06 MED ORDER — TRAMADOL HCL 50 MG PO TABS: 50.0000 mg | ORAL_TABLET | Freq: Four times a day (QID) | ORAL | 0 refills | Status: DC | PRN

## 2019-01-06 NOTE — Discharge Summary (Signed)
Physician Discharge Summary [facility]  Patient ID: Cody Shepard MRN: [id number] DOB/AGE: 1965-11-25 53 y.o.  Admit date: 01/04/2019 Discharge date: 01/06/2019  Admission Diagnoses:  Adrenal adenoma, hyperaldosteronism  Discharge Diagnoses:  Principal Problem:   Hyperaldosteronism ([place]) Active Problems:   Adrenal adenoma, left   Discharged Condition: good  Hospital Course: Patient was admitted for observation following adrenal surgery.  Post op course was uncomplicated.  Pain was well controlled.  Tolerated diet.  Patient was prepared for discharge home on POD#2.  Consults: None  Treatments: surgery: laparoscopic left adrenalectomy  Discharge Exam: Blood pressure (!) 158/107, pulse 77, temperature 98.1 F (36.7 C), temperature source Oral, resp. rate 20, height 5\' 9"  (1.753 m), weight 88.8 kg, SpO2 97 %. HEENT - clear Neck - soft Chest - clear bilaterally Cor - RRR Abd - soft without distension; BS present; wounds dry and intact; umbilical hernia soft and reducible  Disposition: Home  Discharge Instructions    Diet - low sodium heart healthy   Complete by:  As directed    Discharge instructions   Complete by:  As directed    [facility]  LAPAROSCOPIC SURGERY:  POST-OP INSTRUCTIONS  Always review your discharge instruction sheet given to you by the facility where your surgery was performed.  A prescription for pain medication may be given to you upon discharge.  Take your pain medication as prescribed.  If narcotic pain medicine is not needed, then you may take acetaminophen (Tylenol) or ibuprofen (Advil) as needed.  Take your usually prescribed medications unless otherwise directed.  If you need a refill on your pain medication, please contact your pharmacy.  They will contact our office to request authorization. Prescriptions will not be filled after 5 P.M. or on weekends.  You should follow a light diet the first few  days after arrival home, such as soup and crackers or toast.  Be sure to include plenty of fluids daily.  Most patients will experience some swelling and bruising in the area of the incisions.  Ice packs will help.  Swelling and bruising can take several days to resolve.   It is common to experience some constipation after surgery.  Increasing fluid intake and taking a stool softener (such as Colace) will usually help or prevent this problem from occurring.  A mild laxative (Milk of Magnesia or Miralax) should be taken according to package instructions if there has been no bowel movement after 48 hours.  You will have steri-strips and a gauze dressing over your incisions.  You may remove the gauze bandage on the second day after surgery, and you may shower at that time.  Leave your steri-strips (small skin tapes) in place directly over the incision.  These strips should remain on the skin for 5-7 days and then be removed.  You may get them wet in the shower and pat them dry.  Any sutures or staples will be removed at the office during your follow-up visit.  ACTIVITIES:  You may resume regular (light) daily activities beginning the next day - such as daily self-care, walking, climbing stairs - gradually increasing activities as tolerated.  You may have sexual intercourse when it is comfortable.  Refrain from any heavy lifting or straining until approved by your doctor.  You may drive when you are no longer taking prescription pain medication, you can comfortably wear a seatbelt, and you can safely maneuver your car and apply brakes.  You should see your doctor  in the office for a follow-up appointment approximately 2-3 weeks after your surgery.  Make sure that you call for this appointment within a day or two after you arrive home to insure a convenient appointment time.  WHEN TO CALL YOUR DOCTOR: Fever over 101.0 Inability to urinate Continued bleeding from incision Increased pain, redness, or  drainage from the incision Increasing abdominal pain  The clinic staff is available to answer your questions during regular business hours.  Please don't hesitate to call and ask to speak to one of the nurses for clinical concerns.  If you have a medical emergency, go to the nearest emergency room or call 911.  A surgeon from [facility] is always on call for the hospital.  Earnstine Regal, MD, [facility] Office: [place] Free:  [place] [phone]  Website: [email or web address]   Increase activity slowly   Complete by:  As directed    No dressing needed   Complete by:  As directed      Allergies as of 01/06/2019   No Known Allergies     Medication List    TAKE these medications   albuterol 108 (90 Base) MCG/ACT inhaler Commonly known as:  PROVENTIL HFA;VENTOLIN HFA Inhale 2 puffs into the lungs every 4 (four) hours as needed for wheezing or shortness of breath.   amLODipine 10 MG tablet Commonly known as:  NORVASC Take 10 mg by mouth daily.   aspirin EC 81 MG tablet Take 81 mg by mouth daily.   atorvastatin 20 MG tablet Commonly known as:  LIPITOR Take 20 mg by mouth at bedtime.   carvedilol 25 MG tablet Commonly known as:  COREG Take 25 mg by mouth 2 (two) times daily with a meal.   cloNIDine 0.3 mg/24hr patch Commonly known as:  CATAPRES - Dosed in mg/24 hr Place 0.3 mg onto the skin every Sunday.   colchicine 0.6 MG tablet Take 0.6 mg by mouth 2 (two) times daily.   fexofenadine 180 MG tablet Commonly known as:  ALLEGRA Take 180 mg by mouth daily.   hydrALAZINE 100 MG tablet Commonly known as:  APRESOLINE Take 150 mg by mouth 2 (two) times daily.   ipratropium-albuterol 0.5-2.5 (3) MG/3ML Soln Commonly known as:  DUONEB Take 3 mLs by nebulization 3 (three) times daily.   isosorbide mononitrate 60 MG 24 hr tablet Commonly known as:  IMDUR Take 60 mg by mouth 2 (two) times daily.    metFORMIN 1000 MG tablet Commonly known as:  GLUCOPHAGE Take 1,000 mg by mouth 2 (two) times daily.   montelukast 10 MG tablet Commonly known as:  SINGULAIR Take 10 mg by mouth daily.   naproxen sodium 220 MG tablet Commonly known as:  ALEVE Take 440 mg by mouth 2 (two) times daily as needed (pain).   olmesartan 40 MG tablet Commonly known as:  BENICAR Take 40 mg by mouth daily.   omeprazole 20 MG capsule Commonly known as:  PRILOSEC Take 20 mg by mouth 2 (two) times daily before a meal.   oxymetazoline 0.05 % nasal spray Commonly known as:  AFRIN Place 1 spray into both nostrils 2 (two) times daily as needed for congestion.   potassium chloride SA 20 MEQ tablet Commonly known as:  K-DUR,KLOR-CON Take 2 tablets (40 mEq total) by mouth 4 (four) times daily. What changed:    how much to take  when to take this   ranolazine 500 MG 12 hr tablet Commonly  known as:  RANEXA Take 500 mg by mouth 2 (two) times daily.   sucralfate 1 g tablet Commonly known as:  CARAFATE Take 1 g by mouth 4 (four) times daily.   traMADol 50 MG tablet Commonly known as:  ULTRAM Take 1-2 tablets (50-100 mg total) by mouth every 6 (six) hours as needed.   vitamin C 1000 MG tablet Take 1,000 mg by mouth daily.      Follow-up Information    Armandina Gemma, MD. Schedule an appointment as soon as possible for a visit in 2 week(s).   Specialty:  General Surgery Contact information: [address] [place] [address] [phone]        Cassandria Anger, MD. Schedule an appointment as soon as possible for a visit in 2 week(s).   Specialty:  Endocrinology Contact information: [place] [address] [phone]           Earnstine Regal, MD, [facility] Office: [phone]   Signed: Armandina Gemma 01/06/2019, 8:32 AM

## 2019-01-06 NOTE — Progress Notes (Signed)
Reviewed discharge paperwork & instructions, medication regimen, and where to pick up prescription with patient. Patient discharged via wheelchair by NT.

## 2019-01-09 ENCOUNTER — Encounter (HOSPITAL_COMMUNITY): Payer: Self-pay | Admitting: Surgery

## 2019-01-10 ENCOUNTER — Telehealth: Payer: Self-pay | Admitting: *Deleted

## 2019-01-10 NOTE — Telephone Encounter (Signed)
-----   Message from Cassandria Anger, MD sent at 01/09/2019  5:34 PM EDT ----- Cody Shepard, I want this patient to return for a visit as soon as possible.  He will have CMP the day before his visit.  I spoke to him and advised him to lower his potassium to 20 mEq twice a day.

## 2019-01-10 NOTE — Telephone Encounter (Signed)
Called pt lvm to let him know that he has appt march 26 at 930 and needs labs a day before.

## 2019-01-17 ENCOUNTER — Other Ambulatory Visit: Payer: Self-pay

## 2019-01-17 ENCOUNTER — Other Ambulatory Visit (HOSPITAL_COMMUNITY)
Admission: RE | Admit: 2019-01-17 | Discharge: 2019-01-17 | Disposition: A | Discharge status: Home / Self Care | Payer: Medicare Other | Source: Ambulatory Visit | Attending: "Endocrinology | Admitting: "Endocrinology

## 2019-01-17 ENCOUNTER — Other Ambulatory Visit: Payer: Self-pay | Admitting: "Endocrinology

## 2019-01-17 DIAGNOSIS — D3502 Benign neoplasm of left adrenal gland: Secondary | ICD-10-CM | POA: Insufficient documentation

## 2019-01-17 DIAGNOSIS — E876 Hypokalemia: Secondary | ICD-10-CM

## 2019-01-18 ENCOUNTER — Encounter: Payer: Self-pay | Admitting: "Endocrinology

## 2019-01-18 ENCOUNTER — Other Ambulatory Visit (HOSPITAL_COMMUNITY)
Admission: RE | Admit: 2019-01-18 | Discharge: 2019-01-18 | Disposition: A | Discharge status: Home / Self Care | Payer: Medicare Other | Source: Ambulatory Visit | Attending: "Endocrinology | Admitting: "Endocrinology

## 2019-01-18 ENCOUNTER — Ambulatory Visit (INDEPENDENT_AMBULATORY_CARE_PROVIDER_SITE_OTHER): Payer: Medicare Other | Admitting: "Endocrinology

## 2019-01-18 DIAGNOSIS — E269 Hyperaldosteronism, unspecified: Secondary | ICD-10-CM

## 2019-01-18 DIAGNOSIS — I10 Essential (primary) hypertension: Secondary | ICD-10-CM

## 2019-01-18 DIAGNOSIS — E876 Hypokalemia: Secondary | ICD-10-CM | POA: Diagnosis not present

## 2019-01-18 DIAGNOSIS — D3502 Benign neoplasm of left adrenal gland: Secondary | ICD-10-CM | POA: Diagnosis present

## 2019-01-18 LAB — COMPREHENSIVE METABOLIC PANEL
ALK PHOS: 54 U/L (ref 38–126)
ALT: 70 U/L — ABNORMAL HIGH (ref 0–44)
ANION GAP: 8 (ref 5–15)
AST: 56 U/L — ABNORMAL HIGH (ref 15–41)
Albumin: 4.1 g/dL (ref 3.5–5.0)
BUN: 25 mg/dL — ABNORMAL HIGH (ref 6–20)
CO2: 24 mmol/L (ref 22–32)
Calcium: 9.8 mg/dL (ref 8.9–10.3)
Chloride: 102 mmol/L (ref 98–111)
Creatinine, Ser: 1.99 mg/dL — ABNORMAL HIGH (ref 0.61–1.24)
GFR calc Af Amer: 43 mL/min — ABNORMAL LOW (ref >60)
GFR calc non Af Amer: 37 mL/min — ABNORMAL LOW (ref >60)
GLUCOSE: 188 mg/dL — AB (ref 70–99)
Potassium: 4.5 mmol/L (ref 3.5–5.1)
Sodium: 134 mmol/L — ABNORMAL LOW (ref 135–145)
Total Bilirubin: 0.6 mg/dL (ref 0.3–1.2)
Total Protein: 7.4 g/dL (ref 6.5–8.1)

## 2019-01-18 NOTE — Progress Notes (Signed)
01/18/2019, 12:23 PM                                                    Endocrinology Telephone Visit Follow up Note -During COVID -19 Pandemic   Subjective:    Patient ID: Cody Shepard, male    DOB: [birth year], PCP Abran Richard, MD   Past Medical History:  Diagnosis Date  . Asthma   . Bronchitis   . COPD (chronic obstructive pulmonary disease) ([place])   . Coronary artery disease   . Diabetes mellitus without complication ([place])   . Headache   . High cholesterol   . Hypertension    Past Surgical History:  Procedure Laterality Date  . LAPAROSCOPIC ADRENALECTOMY Left 01/04/2019   Procedure: LAPAROSCOPIC LEFT ADRENALECTOMY;  Surgeon: Armandina Gemma, MD;  Location: [facility];  Service: General;  Laterality: Left;  . NO PAST SURGERIES     Social History   Socioeconomic History  . Marital status: Single    Spouse name: Not on file  . Number of children: Not on file  . Years of education: Not on file  . Highest education level: Not on file  Occupational History  . Not on file  Social Needs  . Financial resource strain: Not on file  . Food insecurity:    Worry: Not on file    Inability: Not on file  . Transportation needs:    Medical: Not on file    Non-medical: Not on file  Tobacco Use  . Smoking status: Former Smoker    Packs/day: 1.00    Types: Cigarettes    Last attempt to quit: 08/30/1997    Years since quitting: 21.4  . Smokeless tobacco: Never Used  Substance and Sexual Activity  . Alcohol use: No  . Drug use: No  . Sexual activity: Not on file  Lifestyle  . Physical activity:    Days per week: Not on file    Minutes per session: Not on file  . Stress: Not on file  Relationships  . Social connections:    Talks on phone: Not on file    Gets together: Not on file    Attends religious service: Not on file    Active member of club or organization: Not on file    Attends meetings of clubs or organizations: Not  on file    Relationship status: Not on file  Other Topics Concern  . Not on file  Social History Narrative  . Not on file   Outpatient Encounter Medications as of 01/18/2019  Medication Sig  . potassium chloride (KLOR-CON) 20 MEQ packet Take 20 mEq by mouth once.  Marland Kitchen albuterol (PROVENTIL HFA;VENTOLIN HFA) 108 (90 BASE) MCG/ACT inhaler Inhale 2 puffs into the lungs every 4 (four) hours as needed for wheezing or shortness of breath.   . Ascorbic Acid (VITAMIN C) 1000 MG tablet Take 1,000 mg by mouth daily.  Marland Kitchen aspirin EC 81 MG tablet Take 81 mg by mouth daily.  Marland Kitchen atorvastatin (LIPITOR) 20 MG tablet Take 20 mg by mouth at bedtime.   Marland Kitchen  carvedilol (COREG) 25 MG tablet Take 25 mg by mouth 2 (two) times daily with a meal.  . cloNIDine (CATAPRES - DOSED IN MG/24 HR) 0.3 mg/24hr patch Place 0.3 mg onto the skin every Sunday.   . colchicine 0.6 MG tablet Take 0.6 mg by mouth 2 (two) times daily.  . fexofenadine (ALLEGRA) 180 MG tablet Take 180 mg by mouth daily.  Marland Kitchen ipratropium-albuterol (DUONEB) 0.5-2.5 (3) MG/3ML SOLN Take 3 mLs by nebulization 3 (three) times daily.  . isosorbide mononitrate (IMDUR) 60 MG 24 hr tablet Take 60 mg by mouth 2 (two) times daily.   . metFORMIN (GLUCOPHAGE) 1000 MG tablet Take 1,000 mg by mouth 2 (two) times daily.  . montelukast (SINGULAIR) 10 MG tablet Take 10 mg by mouth daily.  . naproxen sodium (ALEVE) 220 MG tablet Take 440 mg by mouth 2 (two) times daily as needed (pain).  Marland Kitchen olmesartan (BENICAR) 40 MG tablet Take 40 mg by mouth daily.  Marland Kitchen omeprazole (PRILOSEC) 20 MG capsule Take 20 mg by mouth 2 (two) times daily before a meal.   . oxymetazoline (AFRIN) 0.05 % nasal spray Place 1 spray into both nostrils 2 (two) times daily as needed for congestion.  . ranolazine (RANEXA) 500 MG 12 hr tablet Take 500 mg by mouth 2 (two) times daily.  . sucralfate (CARAFATE) 1 g tablet Take 1 g by mouth 4 (four) times daily.  . traMADol (ULTRAM) 50 MG tablet Take 1-2 tablets (50-100  mg total) by mouth every 6 (six) hours as needed.  . [DISCONTINUED] amLODipine (NORVASC) 10 MG tablet Take 10 mg by mouth daily.  . [DISCONTINUED] hydrALAZINE (APRESOLINE) 100 MG tablet Take 150 mg by mouth 2 (two) times daily.  . [DISCONTINUED] potassium chloride SA (K-DUR,KLOR-CON) 20 MEQ tablet Take 2 tablets (40 mEq total) by mouth 4 (four) times daily. (Patient taking differently: Take 20 mEq by mouth 3 (three) times daily. )   No facility-administered encounter medications on file as of 01/18/2019.    ALLERGIES: No Known Allergies  VACCINATION STATUS:  There is no immunization history on file for this patient.  HPI Cody Shepard is 53 y.o. male who presents today with a medical history as above. -He was diagnosed with primary hyperaldosteronism with a left adrenal adenoma.  -He underwent left adrenalectomy on January 04, 2019 which revealed aldosteronoma.  He remains on multiple antihypertensive medications, his potassium dose was lowered significantly in recent days.   He is reporting feeling better ,recovering from his surgery.  Previsit CMP showed potassium of 4.5.    -He has multiple medical problems including type 2 diabetes on metformin, chronic severe hypertension  requiring currently 7 different medications.   He also has hyperlipidemia, coronary artery disease on medical management. -He smoked heavily in the past, not a current smoker. He is not physically  active generally. -Patient denies any family history of adrenal, thyroid, pituitary, nor parathyroid dysfunctions. -His father was diagnosed with CVA at age of 56, and later died of complications from coronary artery disease.  He denies family history of syndromic kidney dysfunctions.  -He is known to have migraine headaches, currently denies headaches, chest pain, shortness of breath.  He denies any rapid weight change.  No palpitations, diaphoresis, nor fainting spells.  He was initially  referred by his cardiologist for  hypokalemia.   Review of Systems   Objective:    There were no vitals taken for this visit.  Wt Readings from Last 3 Encounters:  01/04/19 195 lb 12.3  oz (88.8 kg)  01/03/19 185 lb (83.9 kg)  10/02/18 198 lb (89.8 kg)    Physical Exam   Recent Results (from the past 2160 hour(s))  Glucose, capillary     Status: None   Collection Time: 01/03/19  9:02 AM  Result Value Ref Range   Glucose-Capillary 98 70 - 99 mg/dL  Hemoglobin A1c     Status: None   Collection Time: 01/03/19  9:27 AM  Result Value Ref Range   Hgb A1c MFr Bld 5.5 4.8 - 5.6 %    Comment: (NOTE) Pre diabetes:          5.7%-6.4% Diabetes:              >6.4% Glycemic control for   <7.0% adults with diabetes    Mean Plasma Glucose 111.15 mg/dL    Comment: Performed at [facility] [address] [place], [place] [address]  Basic metabolic panel     Status: Abnormal   Collection Time: 01/03/19  9:27 AM  Result Value Ref Range   Sodium 137 135 - 145 mmol/L   Potassium 3.4 (L) 3.5 - 5.1 mmol/L   Chloride 104 98 - 111 mmol/L   CO2 26 22 - 32 mmol/L   Glucose, Bld 113 (H) 70 - 99 mg/dL   BUN 17 6 - 20 mg/dL   Creatinine, Ser 1.20 0.61 - 1.24 mg/dL   Calcium 9.0 8.9 - 10.3 mg/dL   GFR calc non Af Amer >60 >60 mL/min   GFR calc Af Amer >60 >60 mL/min   Anion gap 7 5 - 15    Comment: Performed at [facility], [place] [address], [place], [place] [address]  CBC     Status: Abnormal   Collection Time: 01/03/19  9:27 AM  Result Value Ref Range   WBC 3.3 (L) 4.0 - 10.5 K/uL   RBC 3.72 (L) 4.22 - 5.81 MIL/uL   Hemoglobin 11.6 (L) 13.0 - 17.0 g/dL   HCT 36.5 (L) 39.0 - 52.0 %   MCV 98.1 80.0 - 100.0 fL   MCH 31.2 26.0 - 34.0 pg   MCHC 31.8 30.0 - 36.0 g/dL   RDW 13.5 11.5 - 15.5 %   Platelets 203 150 - 400 K/uL   nRBC 0.0 0.0 - 0.2 %    Comment: Performed at [facility], [place] [address], [place], [place] [address]  Glucose, capillary     Status: Abnormal   Collection  Time: 01/04/19 11:39 AM  Result Value Ref Range   Glucose-Capillary 119 (H) 70 - 99 mg/dL  Type and screen [place]     Status: None   Collection Time: 01/04/19 11:55 AM  Result Value Ref Range   ABO/RH(D) A POS    Antibody Screen NEG    Sample Expiration      01/07/2019 Performed at [facility], [place] [address] [place], [place] [address]   ABO/Rh     Status: None   Collection Time: 01/04/19 11:55 AM  Result Value Ref Range   ABO/RH(D)      A POS Performed at [facility], [place] [address], [place], [place] [address]   Glucose, capillary     Status: Abnormal   Collection Time: 01/04/19  5:58 PM  Result Value Ref Range   Glucose-Capillary 160 (H) 70 - 99 mg/dL  Glucose, capillary     Status: Abnormal   Collection Time: 01/04/19  8:39 PM  Result Value Ref Range   Glucose-Capillary 160 (H) 70 - 99 mg/dL   Comment 1 Notify RN   Basic metabolic panel     Status: Abnormal   Collection Time: 01/05/19  3:13 AM  Result Value Ref Range   Sodium 134 (L) 135 - 145 mmol/L   Potassium 3.1 (L) 3.5 - 5.1 mmol/L   Chloride 97 (L) 98 - 111 mmol/L   CO2 26 22 - 32 mmol/L   Glucose, Bld 194 (H) 70 - 99 mg/dL   BUN 14 6 - 20 mg/dL   Creatinine, Ser 1.00 0.61 - 1.24 mg/dL   Calcium 8.2 (L) 8.9 - 10.3 mg/dL   GFR calc non Af Amer >60 >60 mL/min   GFR calc Af Amer >60 >60 mL/min   Anion gap 11 5 - 15    Comment: Performed at [facility], [place] [address] [place], [place] [address]  CBC     Status: Abnormal   Collection Time: 01/05/19  3:13 AM  Result Value Ref Range   WBC 6.0 4.0 - 10.5 K/uL   RBC 3.71 (L) 4.22 - 5.81 MIL/uL   Hemoglobin 11.6 (L) 13.0 - 17.0 g/dL   HCT 36.6 (L) 39.0 - 52.0 %   MCV 98.7 80.0 - 100.0 fL   MCH 31.3 26.0 - 34.0 pg   MCHC 31.7 30.0 - 36.0 g/dL   RDW 13.2 11.5 - 15.5 %   Platelets 197 150 - 400 K/uL   nRBC 0.0 0.0 - 0.2 %    Comment: Performed at [facility],  [place] [address], [place], [place] [address]  Glucose, capillary     Status: Abnormal   Collection Time: 01/05/19  7:31 AM  Result Value Ref Range   Glucose-Capillary 134 (H) 70 - 99 mg/dL  Glucose, capillary     Status: Abnormal   Collection Time: 01/05/19 11:47 AM  Result Value Ref Range   Glucose-Capillary 132 (H) 70 - 99 mg/dL  Glucose, capillary     Status: Abnormal   Collection Time: 01/05/19  4:02 PM  Result Value Ref Range   Glucose-Capillary 111 (H) 70 - 99 mg/dL   Comment 1 Notify RN    Comment 2 Document in Chart   Glucose, capillary     Status: Abnormal   Collection Time: 01/05/19  9:41 PM  Result Value Ref Range   Glucose-Capillary 118 (H) 70 - 99 mg/dL   Comment 1 Notify RN   Basic metabolic panel     Status: Abnormal   Collection Time: 01/06/19  3:04 AM  Result Value Ref Range   Sodium 139 135 - 145 mmol/L   Potassium 3.7 3.5 - 5.1 mmol/L   Chloride 105 98 - 111 mmol/L   CO2 27 22 - 32 mmol/L   Glucose, Bld 104 (H) 70 - 99 mg/dL   BUN 12 6 - 20 mg/dL   Creatinine, Ser 0.97 0.61 - 1.24 mg/dL   Calcium 8.4 (L) 8.9 - 10.3 mg/dL   GFR calc non Af Amer >60 >60 mL/min   GFR calc Af Amer >60 >60 mL/min   Anion gap 7 5 - 15    Comment: Performed at [facility], [place] [address] [place], [place] [address]  Glucose, capillary     Status: Abnormal   Collection Time: 01/06/19  7:55 AM  Result Value Ref Range   Glucose-Capillary 110 (H) 70 - 99 mg/dL  Glucose, capillary     Status: Abnormal  Collection Time: 01/06/19 11:57 AM  Result Value Ref Range   Glucose-Capillary 103 (H) 70 - 99 mg/dL  Comprehensive metabolic panel     Status: Abnormal   Collection Time: 01/18/19 11:30 AM  Result Value Ref Range   Sodium 134 (L) 135 - 145 mmol/L   Potassium 4.5 3.5 - 5.1 mmol/L   Chloride 102 98 - 111 mmol/L   CO2 24 22 - 32 mmol/L   Glucose, Bld 188 (H) 70 - 99 mg/dL   BUN 25 (H) 6 - 20 mg/dL   Creatinine, Ser 1.99 (H) 0.61 - 1.24 mg/dL   Calcium 9.8  8.9 - 10.3 mg/dL   Total Protein 7.4 6.5 - 8.1 g/dL   Albumin 4.1 3.5 - 5.0 g/dL   AST 56 (H) 15 - 41 U/L   ALT 70 (H) 0 - 44 U/L   Alkaline Phosphatase 54 38 - 126 U/L   Total Bilirubin 0.6 0.3 - 1.2 mg/dL   GFR calc non Af Amer 37 (L) >60 mL/min   GFR calc Af Amer 43 (L) >60 mL/min   Anion gap 8 5 - 15    Comment: Performed at [facility], [address] [place], [place] [address]    Assessment & Plan:   1.  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  Moderate severe hypertension- improving  4.  Type 2 diabetes -Recent A1c.  He is advised to continue metformin 500 mg p.o. twice daily.  - Based on review of his medical records, review of his medical records, he has subacute/chronic spontaneous hypokalemia associated with difficult to control hypertension requiring up to 7 medications. -He is status post left adrenalectomy by Dr. Harlow Asa which revealed aldosteronoma with subsequent resolution of chronic hypokalemia. -He is advised to lower his potassium further to 20 mEq p.o. daily.  He has potential to come off of potassium supplements on subsequent visits. -Patient previously required 7 different blood pressure medications to control blood pressure.  He used his neighbors blood pressure monitor and reports blood pressure reading of 115/80 this morning.  I have advised him to discontinue amlodipine, and hydralazine. -He still has several medications including Benicar 40 mg p.o. daily, clonidine 0.3 mg / 24-hour patch, Imdur 60 mg p.o. daily.   Will require close monitoring of blood pressure, will return in 8 weeks for reevaluation with repeat CMP.  -He is advised to continue metformin for his type 2 diabetes.  - I advised him  to maintain close follow up with Abran Richard, MD for primary care needs.  Follow up plan: Return in about 8 weeks (around 03/15/2019) for Follow up with Pre-visit Labs.   Glade Lloyd, MD [facility] [address] [place], [place] [address] Phone: [phone]  Fax: [phone]     01/18/2019, 12:23 PM  This note was partially dictated with voice recognition software. Similar sounding words can be transcribed inadequately or may not  be corrected upon review.

## 2019-02-21 ENCOUNTER — Other Ambulatory Visit: Payer: Self-pay | Admitting: Surgery

## 2019-02-21 DIAGNOSIS — E2609 Other primary hyperaldosteronism: Secondary | ICD-10-CM

## 2019-03-05 ENCOUNTER — Other Ambulatory Visit: Payer: Self-pay | Admitting: Surgery

## 2019-03-05 DIAGNOSIS — D3502 Benign neoplasm of left adrenal gland: Secondary | ICD-10-CM

## 2019-03-05 DIAGNOSIS — E2609 Other primary hyperaldosteronism: Secondary | ICD-10-CM

## 2019-03-07 ENCOUNTER — Other Ambulatory Visit: Payer: Self-pay

## 2019-03-07 ENCOUNTER — Ambulatory Visit
Admission: RE | Admit: 2019-03-07 | Discharge: 2019-03-07 | Disposition: A | Discharge status: Home / Self Care | Payer: Medicare Other | Source: Ambulatory Visit | Attending: Surgery | Admitting: Surgery

## 2019-03-07 DIAGNOSIS — D3502 Benign neoplasm of left adrenal gland: Secondary | ICD-10-CM

## 2019-03-07 DIAGNOSIS — E2609 Other primary hyperaldosteronism: Secondary | ICD-10-CM

## 2019-03-09 ENCOUNTER — Encounter (HOSPITAL_COMMUNITY): Payer: Self-pay | Admitting: Emergency Medicine

## 2019-03-09 ENCOUNTER — Observation Stay (HOSPITAL_COMMUNITY)
Admission: EM | Admit: 2019-03-09 | Discharge: 2019-03-11 | Disposition: A | Discharge status: Home / Self Care | Payer: Medicare Other | Attending: Internal Medicine | Admitting: Internal Medicine

## 2019-03-09 ENCOUNTER — Other Ambulatory Visit: Payer: Self-pay

## 2019-03-09 DIAGNOSIS — I10 Essential (primary) hypertension: Secondary | ICD-10-CM | POA: Diagnosis not present

## 2019-03-09 DIAGNOSIS — Z1159 Encounter for screening for other viral diseases: Secondary | ICD-10-CM | POA: Diagnosis not present

## 2019-03-09 DIAGNOSIS — Z87891 Personal history of nicotine dependence: Secondary | ICD-10-CM | POA: Diagnosis not present

## 2019-03-09 DIAGNOSIS — Z7982 Long term (current) use of aspirin: Secondary | ICD-10-CM | POA: Insufficient documentation

## 2019-03-09 DIAGNOSIS — E785 Hyperlipidemia, unspecified: Secondary | ICD-10-CM | POA: Diagnosis present

## 2019-03-09 DIAGNOSIS — K5900 Constipation, unspecified: Secondary | ICD-10-CM

## 2019-03-09 DIAGNOSIS — Z79899 Other long term (current) drug therapy: Secondary | ICD-10-CM | POA: Diagnosis not present

## 2019-03-09 DIAGNOSIS — G459 Transient cerebral ischemic attack, unspecified: Secondary | ICD-10-CM

## 2019-03-09 DIAGNOSIS — J449 Chronic obstructive pulmonary disease, unspecified: Secondary | ICD-10-CM | POA: Diagnosis present

## 2019-03-09 DIAGNOSIS — R531 Weakness: Secondary | ICD-10-CM

## 2019-03-09 DIAGNOSIS — I2510 Atherosclerotic heart disease of native coronary artery without angina pectoris: Secondary | ICD-10-CM | POA: Diagnosis present

## 2019-03-09 DIAGNOSIS — E119 Type 2 diabetes mellitus without complications: Secondary | ICD-10-CM

## 2019-03-09 DIAGNOSIS — R51 Headache: Secondary | ICD-10-CM | POA: Diagnosis not present

## 2019-03-09 DIAGNOSIS — R519 Headache, unspecified: Secondary | ICD-10-CM

## 2019-03-09 NOTE — ED Triage Notes (Signed)
Pt states around 1400 he began to feel "bad." Pt states around 1900 he began to feel worse and his head began to hurt "all of a sudden." Pt C/O blurry vision and dizziness.

## 2019-03-10 ENCOUNTER — Emergency Department (HOSPITAL_COMMUNITY): Payer: Medicare Other

## 2019-03-10 ENCOUNTER — Observation Stay (HOSPITAL_COMMUNITY): Payer: Medicare Other

## 2019-03-10 ENCOUNTER — Encounter (HOSPITAL_COMMUNITY): Payer: Self-pay | Admitting: Internal Medicine

## 2019-03-10 ENCOUNTER — Observation Stay (HOSPITAL_BASED_OUTPATIENT_CLINIC_OR_DEPARTMENT_OTHER): Payer: Medicare Other

## 2019-03-10 DIAGNOSIS — R519 Headache, unspecified: Secondary | ICD-10-CM | POA: Diagnosis present

## 2019-03-10 DIAGNOSIS — E785 Hyperlipidemia, unspecified: Secondary | ICD-10-CM | POA: Diagnosis not present

## 2019-03-10 DIAGNOSIS — R531 Weakness: Secondary | ICD-10-CM | POA: Diagnosis not present

## 2019-03-10 DIAGNOSIS — G459 Transient cerebral ischemic attack, unspecified: Secondary | ICD-10-CM

## 2019-03-10 DIAGNOSIS — J449 Chronic obstructive pulmonary disease, unspecified: Secondary | ICD-10-CM | POA: Diagnosis present

## 2019-03-10 DIAGNOSIS — E119 Type 2 diabetes mellitus without complications: Secondary | ICD-10-CM | POA: Diagnosis not present

## 2019-03-10 DIAGNOSIS — R51 Headache: Secondary | ICD-10-CM | POA: Diagnosis not present

## 2019-03-10 DIAGNOSIS — I10 Essential (primary) hypertension: Secondary | ICD-10-CM | POA: Diagnosis not present

## 2019-03-10 DIAGNOSIS — Z7982 Long term (current) use of aspirin: Secondary | ICD-10-CM | POA: Diagnosis not present

## 2019-03-10 DIAGNOSIS — I340 Nonrheumatic mitral (valve) insufficiency: Secondary | ICD-10-CM

## 2019-03-10 DIAGNOSIS — I361 Nonrheumatic tricuspid (valve) insufficiency: Secondary | ICD-10-CM

## 2019-03-10 DIAGNOSIS — I2510 Atherosclerotic heart disease of native coronary artery without angina pectoris: Secondary | ICD-10-CM | POA: Diagnosis present

## 2019-03-10 DIAGNOSIS — Z79899 Other long term (current) drug therapy: Secondary | ICD-10-CM | POA: Diagnosis not present

## 2019-03-10 DIAGNOSIS — Z1159 Encounter for screening for other viral diseases: Secondary | ICD-10-CM | POA: Diagnosis not present

## 2019-03-10 DIAGNOSIS — Z87891 Personal history of nicotine dependence: Secondary | ICD-10-CM | POA: Diagnosis not present

## 2019-03-10 LAB — LIPID PANEL
Cholesterol: 189 mg/dL (ref 0–200)
HDL: 35 mg/dL — ABNORMAL LOW (ref >40)
LDL Cholesterol: 132 mg/dL — ABNORMAL HIGH (ref 0–99)
Total CHOL/HDL Ratio: 5.4 RATIO
Triglycerides: 110 mg/dL (ref <150)
VLDL: 22 mg/dL (ref 0–40)

## 2019-03-10 LAB — CBC WITH DIFFERENTIAL/PLATELET
Abs Immature Granulocytes: 0.04 10*3/uL (ref 0.00–0.07)
Basophils Absolute: 0 10*3/uL (ref 0.0–0.1)
Basophils Relative: 1 %
Eosinophils Absolute: 0.2 10*3/uL (ref 0.0–0.5)
Eosinophils Relative: 4 %
HCT: 31.1 % — ABNORMAL LOW (ref 39.0–52.0)
Hemoglobin: 10.4 g/dL — ABNORMAL LOW (ref 13.0–17.0)
Immature Granulocytes: 1 %
Lymphocytes Relative: 48 %
Lymphs Abs: 2.3 10*3/uL (ref 0.7–4.0)
MCH: 31 pg (ref 26.0–34.0)
MCHC: 33.4 g/dL (ref 30.0–36.0)
MCV: 92.6 fL (ref 80.0–100.0)
Monocytes Absolute: 0.6 10*3/uL (ref 0.1–1.0)
Monocytes Relative: 12 %
Neutro Abs: 1.6 10*3/uL — ABNORMAL LOW (ref 1.7–7.7)
Neutrophils Relative %: 34 %
Platelets: 214 10*3/uL (ref 150–400)
RBC: 3.36 MIL/uL — ABNORMAL LOW (ref 4.22–5.81)
RDW: 13.5 % (ref 11.5–15.5)
WBC: 4.8 10*3/uL (ref 4.0–10.5)
nRBC: 0 % (ref 0.0–0.2)

## 2019-03-10 LAB — ECHOCARDIOGRAM COMPLETE

## 2019-03-10 LAB — BASIC METABOLIC PANEL
Anion gap: 11 (ref 5–15)
BUN: 18 mg/dL (ref 6–20)
CO2: 22 mmol/L (ref 22–32)
Calcium: 8.8 mg/dL — ABNORMAL LOW (ref 8.9–10.3)
Chloride: 100 mmol/L (ref 98–111)
Creatinine, Ser: 1.82 mg/dL — ABNORMAL HIGH (ref 0.61–1.24)
GFR calc Af Amer: 47 mL/min — ABNORMAL LOW (ref >60)
GFR calc non Af Amer: 41 mL/min — ABNORMAL LOW (ref >60)
Glucose, Bld: 106 mg/dL — ABNORMAL HIGH (ref 70–99)
Potassium: 3.5 mmol/L (ref 3.5–5.1)
Sodium: 133 mmol/L — ABNORMAL LOW (ref 135–145)

## 2019-03-10 LAB — RAPID URINE DRUG SCREEN, HOSP PERFORMED
Amphetamines: NOT DETECTED
Barbiturates: NOT DETECTED
Benzodiazepines: NOT DETECTED
Cocaine: NOT DETECTED
Opiates: NOT DETECTED
Tetrahydrocannabinol: NOT DETECTED

## 2019-03-10 LAB — URINALYSIS, ROUTINE W REFLEX MICROSCOPIC
Bilirubin Urine: NEGATIVE
Glucose, UA: NEGATIVE mg/dL
Hgb urine dipstick: NEGATIVE
Ketones, ur: NEGATIVE mg/dL
Leukocytes,Ua: NEGATIVE
Nitrite: NEGATIVE
Protein, ur: NEGATIVE mg/dL
Specific Gravity, Urine: 1.008 (ref 1.005–1.030)
pH: 5 (ref 5.0–8.0)

## 2019-03-10 LAB — SARS CORONAVIRUS 2 BY RT PCR (HOSPITAL ORDER, PERFORMED IN ~~LOC~~ HOSPITAL LAB): SARS Coronavirus 2: NEGATIVE

## 2019-03-10 LAB — TROPONIN I: Troponin I: <0.03 ng/mL (ref <0.03)

## 2019-03-10 LAB — APTT: aPTT: 32 seconds (ref 24–36)

## 2019-03-10 LAB — HEMOGLOBIN A1C
Hgb A1c MFr Bld: 6.3 % — ABNORMAL HIGH (ref 4.8–5.6)
Mean Plasma Glucose: 134.11 mg/dL

## 2019-03-10 LAB — ETHANOL: Alcohol, Ethyl (B): <10 mg/dL (ref <10)

## 2019-03-10 LAB — PROTIME-INR
INR: 1 (ref 0.8–1.2)
Prothrombin Time: 13.6 seconds (ref 11.4–15.2)

## 2019-03-10 LAB — I-STAT CREATININE, ED: Creatinine, Ser: 1.6 mg/dL — ABNORMAL HIGH (ref 0.61–1.24)

## 2019-03-10 MED ORDER — FLUTICASONE PROPIONATE 50 MCG/ACT NA SUSP
2.0000 | Freq: Every day | NASAL | Status: DC
  Administered 2019-03-10 – 2019-03-11 (×2): 2 via NASAL
  Filled 2019-03-10: qty 16

## 2019-03-10 MED ORDER — FLUTICASONE-UMECLIDIN-VILANT 100-62.5-25 MCG/INH IN AEPB: 1.0000 | INHALATION_SPRAY | Freq: Every day | RESPIRATORY_TRACT | Status: DC

## 2019-03-10 MED ORDER — STROKE: EARLY STAGES OF RECOVERY BOOK
Freq: Once | Status: DC
  Filled 2019-03-10: qty 1

## 2019-03-10 MED ORDER — KETOROLAC TROMETHAMINE 30 MG/ML IJ SOLN
30.0000 mg | Freq: Four times a day (QID) | INTRAMUSCULAR | Status: AC | PRN
  Administered 2019-03-10: 30 mg via INTRAVENOUS
  Filled 2019-03-10: qty 1

## 2019-03-10 MED ORDER — IOHEXOL 350 MG/ML SOLN
100.0000 mL | Freq: Once | INTRAVENOUS | Status: AC | PRN
  Administered 2019-03-10: 01:00:00 100 mL via INTRAVENOUS

## 2019-03-10 MED ORDER — HYDRALAZINE HCL 50 MG PO TABS
100.0000 mg | ORAL_TABLET | Freq: Three times a day (TID) | ORAL | Status: DC
  Administered 2019-03-10 – 2019-03-11 (×4): 100 mg via ORAL
  Filled 2019-03-10 (×4): qty 2

## 2019-03-10 MED ORDER — RANOLAZINE ER 500 MG PO TB12
500.0000 mg | ORAL_TABLET | Freq: Two times a day (BID) | ORAL | Status: DC
  Administered 2019-03-10 – 2019-03-11 (×2): 500 mg via ORAL
  Filled 2019-03-10 (×2): qty 1

## 2019-03-10 MED ORDER — CLONIDINE HCL 0.3 MG/24HR TD PTWK
0.3000 mg | MEDICATED_PATCH | TRANSDERMAL | Status: DC
  Administered 2019-03-11: 09:00:00 0.3 mg via TRANSDERMAL
  Filled 2019-03-10: qty 1

## 2019-03-10 MED ORDER — ACETAMINOPHEN 160 MG/5ML PO SOLN: 650.0000 mg | ORAL | Status: DC | PRN

## 2019-03-10 MED ORDER — COLCHICINE 0.6 MG PO TABS
0.6000 mg | ORAL_TABLET | Freq: Two times a day (BID) | ORAL | Status: DC
  Administered 2019-03-10 – 2019-03-11 (×3): 0.6 mg via ORAL
  Filled 2019-03-10 (×3): qty 1

## 2019-03-10 MED ORDER — DIPHENHYDRAMINE HCL 50 MG/ML IJ SOLN
25.0000 mg | Freq: Once | INTRAMUSCULAR | Status: AC
  Administered 2019-03-10: 01:00:00 25 mg via INTRAVENOUS
  Filled 2019-03-10: qty 1

## 2019-03-10 MED ORDER — ATORVASTATIN CALCIUM 10 MG PO TABS
20.0000 mg | ORAL_TABLET | Freq: Every day | ORAL | Status: DC
  Administered 2019-03-10: 20 mg via ORAL
  Filled 2019-03-10: qty 2

## 2019-03-10 MED ORDER — ACETAMINOPHEN 325 MG PO TABS: 650.0000 mg | ORAL_TABLET | ORAL | Status: DC | PRN

## 2019-03-10 MED ORDER — ACETAMINOPHEN 650 MG RE SUPP: 650.0000 mg | RECTAL | Status: DC | PRN

## 2019-03-10 MED ORDER — FLUTICASONE FUROATE-VILANTEROL 200-25 MCG/INH IN AEPB
1.0000 | INHALATION_SPRAY | Freq: Every day | RESPIRATORY_TRACT | Status: DC
  Administered 2019-03-10 – 2019-03-11 (×2): 1 via RESPIRATORY_TRACT
  Filled 2019-03-10: qty 28

## 2019-03-10 MED ORDER — MONTELUKAST SODIUM 10 MG PO TABS
10.0000 mg | ORAL_TABLET | Freq: Every day | ORAL | Status: DC
  Administered 2019-03-10 – 2019-03-11 (×2): 10 mg via ORAL
  Filled 2019-03-10 (×2): qty 1

## 2019-03-10 MED ORDER — ALBUTEROL SULFATE (2.5 MG/3ML) 0.083% IN NEBU: 3.0000 mL | INHALATION_SOLUTION | RESPIRATORY_TRACT | Status: DC | PRN

## 2019-03-10 MED ORDER — KETOROLAC TROMETHAMINE 30 MG/ML IJ SOLN
30.0000 mg | Freq: Once | INTRAMUSCULAR | Status: AC
  Administered 2019-03-10: 01:00:00 30 mg via INTRAVENOUS
  Filled 2019-03-10: qty 1

## 2019-03-10 MED ORDER — METOCLOPRAMIDE HCL 5 MG/ML IJ SOLN
10.0000 mg | Freq: Once | INTRAMUSCULAR | Status: AC
  Administered 2019-03-10: 10 mg via INTRAVENOUS
  Filled 2019-03-10: qty 2

## 2019-03-10 MED ORDER — OXYMETAZOLINE HCL 0.05 % NA SOLN
1.0000 | Freq: Two times a day (BID) | NASAL | Status: DC | PRN
  Filled 2019-03-10: qty 30

## 2019-03-10 MED ORDER — UMECLIDINIUM BROMIDE 62.5 MCG/INH IN AEPB
1.0000 | INHALATION_SPRAY | Freq: Every day | RESPIRATORY_TRACT | Status: DC
  Administered 2019-03-10 – 2019-03-11 (×2): 1 via RESPIRATORY_TRACT
  Filled 2019-03-10: qty 7

## 2019-03-10 NOTE — ED Notes (Signed)
ED TO INPATIENT HANDOFF REPORT  ED Nurse Name and Phone # Shirlean Mylar  [phone]  S Name/Age/Gender Cody Shepard 53 y.o. male Room/Bed: APA03/APA03  Code Status   Code Status: Full Code  Home/SNF/Other Home Patient oriented to: self, place, time and situation Is this baseline? Yes   Triage Complete: Triage complete  Chief Complaint sick person  Triage Note Pt states around 1400 he began to feel "bad." Pt states around 1900 he began to feel worse and his head began to hurt "all of a sudden." Pt C/O blurry vision and dizziness.    Allergies No Known Allergies  Level of Care/Admitting Diagnosis ED Disposition    ED Disposition Condition Comment   Admit  Hospital Area: [facility] [[id number]]  Level of Care: Telemetry Medical [104]  I expect the patient will be discharged within 24 hours: No (not a candidate for 5C-Observation unit)  Covid Evaluation: Screening Protocol (No Symptoms)  Diagnosis: Headache [[id number]]  Admitting Physician: Reubin Milan [[id number]]  Attending Physician: Reubin Milan [[id number]]  PT Class (Do Not Modify): Observation [104]  PT Acc Code (Do Not Modify): Observation [10022]       B Medical/Surgery History Past Medical History:  Diagnosis Date  . Asthma   . Bronchitis   . COPD (chronic obstructive pulmonary disease) ([place])   . Coronary artery disease   . Diabetes mellitus without complication ([place])   . Headache   . High cholesterol   . Hypertension    Past Surgical History:  Procedure Laterality Date  . LAPAROSCOPIC ADRENALECTOMY Left 01/04/2019   Procedure: LAPAROSCOPIC LEFT ADRENALECTOMY;  Surgeon: Armandina Gemma, MD;  Location: [facility];  Service: General;  Laterality: Left;  . NO PAST SURGERIES       A IV Location/Drains/Wounds Patient Lines/Drains/Airways Status   Active Line/Drains/Airways    Name:   Placement date:   Placement time:   Site:   Days:   Peripheral IV 03/10/19 Right Forearm   03/10/19    0000     Forearm   less than 1   Incision (Closed) 01/04/19 Abdomen Other (Comment)   01/04/19    1619     65   Incision - 4 Ports Abdomen Right;Medial Left;Mid;Upper Left;Mid Left;Lateral   01/04/19    1440     65          Intake/Output Last 24 hours No intake or output data in the 24 hours ending 03/10/19 0741  Labs/Imaging Results for orders placed or performed during the hospital encounter of 03/09/19 (from the past 48 hour(s))  CBC with Differential/Platelet     Status: Abnormal   Collection Time: 03/10/19 12:10 AM  Result Value Ref Range   WBC 4.8 4.0 - 10.5 K/uL   RBC 3.36 (L) 4.22 - 5.81 MIL/uL   Hemoglobin 10.4 (L) 13.0 - 17.0 g/dL   HCT 31.1 (L) 39.0 - 52.0 %   MCV 92.6 80.0 - 100.0 fL   MCH 31.0 26.0 - 34.0 pg   MCHC 33.4 30.0 - 36.0 g/dL   RDW 13.5 11.5 - 15.5 %   Platelets 214 150 - 400 K/uL   nRBC 0.0 0.0 - 0.2 %   Neutrophils Relative % 34 %   Neutro Abs 1.6 (L) 1.7 - 7.7 K/uL   Lymphocytes Relative 48 %   Lymphs Abs 2.3 0.7 - 4.0 K/uL   Monocytes Relative 12 %   Monocytes Absolute 0.6 0.1 - 1.0 K/uL   Eosinophils Relative 4 %  Eosinophils Absolute 0.2 0.0 - 0.5 K/uL   Basophils Relative 1 %   Basophils Absolute 0.0 0.0 - 0.1 K/uL   Immature Granulocytes 1 %   Abs Immature Granulocytes 0.04 0.00 - 0.07 K/uL    Comment: Performed at [facility], [address], [place], [place] [address]  Basic metabolic panel     Status: Abnormal   Collection Time: 03/10/19 12:10 AM  Result Value Ref Range   Sodium 133 (L) 135 - 145 mmol/L   Potassium 3.5 3.5 - 5.1 mmol/L   Chloride 100 98 - 111 mmol/L   CO2 22 22 - 32 mmol/L   Glucose, Bld 106 (H) 70 - 99 mg/dL   BUN 18 6 - 20 mg/dL   Creatinine, Ser 1.82 (H) 0.61 - 1.24 mg/dL   Calcium 8.8 (L) 8.9 - 10.3 mg/dL   GFR calc non Af Amer 41 (L) >60 mL/min   GFR calc Af Amer 47 (L) >60 mL/min   Anion gap 11 5 - 15    Comment: Performed at [facility], [address] [place], [place] [address]  Troponin I - ONCE - STAT      Status: None   Collection Time: 03/10/19 12:10 AM  Result Value Ref Range   Troponin I <0.03 <0.03 ng/mL    Comment: Performed at [facility], [address], [place], [place] [address]  Ethanol     Status: None   Collection Time: 03/10/19 12:10 AM  Result Value Ref Range   Alcohol, Ethyl (B) <10 <10 mg/dL    Comment: (NOTE) Lowest detectable limit for serum alcohol is 10 mg/dL. For medical purposes only. Performed at [facility], [address], [place], [place] [address]   Protime-INR     Status: None   Collection Time: 03/10/19 12:10 AM  Result Value Ref Range   Prothrombin Time 13.6 11.4 - 15.2 seconds   INR 1.0 0.8 - 1.2    Comment: (NOTE) INR goal varies based on device and disease states. Performed at [facility], [address] [place], [place] [address]   APTT     Status: None   Collection Time: 03/10/19 12:10 AM  Result Value Ref Range   aPTT 32 24 - 36 seconds    Comment: Performed at [facility], [address] [place], [place] [address]  Urine rapid drug screen (hosp performed)     Status: None   Collection Time: 03/10/19 12:12 AM  Result Value Ref Range   Opiates NONE DETECTED NONE DETECTED   Cocaine NONE DETECTED NONE DETECTED   Benzodiazepines NONE DETECTED NONE DETECTED   Amphetamines NONE DETECTED NONE DETECTED   Tetrahydrocannabinol NONE DETECTED NONE DETECTED   Barbiturates NONE DETECTED NONE DETECTED    Comment: (NOTE) DRUG SCREEN FOR MEDICAL PURPOSES ONLY.  IF CONFIRMATION IS NEEDED FOR ANY PURPOSE, NOTIFY LAB WITHIN 5 DAYS. LOWEST DETECTABLE LIMITS FOR URINE DRUG SCREEN Drug Class                     Cutoff (ng/mL) Amphetamine and metabolites    1000 Barbiturate and metabolites    200 Benzodiazepine                 948 Tricyclics and metabolites     300 Opiates and metabolites        300 Cocaine and metabolites        300 THC  59 Performed at [facility], [address] [place], [place] [address]   Urinalysis,  Routine w reflex microscopic     Status: None   Collection Time: 03/10/19 12:12 AM  Result Value Ref Range   Color, Urine YELLOW YELLOW   APPearance CLEAR CLEAR   Specific Gravity, Urine 1.008 1.005 - 1.030   pH 5.0 5.0 - 8.0   Glucose, UA NEGATIVE NEGATIVE mg/dL   Hgb urine dipstick NEGATIVE NEGATIVE   Bilirubin Urine NEGATIVE NEGATIVE   Ketones, ur NEGATIVE NEGATIVE mg/dL   Protein, ur NEGATIVE NEGATIVE mg/dL   Nitrite NEGATIVE NEGATIVE   Leukocytes,Ua NEGATIVE NEGATIVE    Comment: Performed at [facility], [address], [place], [place] [address]  I-stat Creatinine, ED     Status: Abnormal   Collection Time: 03/10/19 12:28 AM  Result Value Ref Range   Creatinine, Ser 1.60 (H) 0.61 - 1.24 mg/dL  SARS Coronavirus 2 (CEPHEID - Performed in [place] hospital lab), Hosp Order     Status: None   Collection Time: 03/10/19  1:56 AM  Result Value Ref Range   SARS Coronavirus 2 NEGATIVE NEGATIVE    Comment: (NOTE) If result is NEGATIVE SARS-CoV-2 target nucleic acids are NOT DETECTED. The SARS-CoV-2 RNA is generally detectable in upper and lower  respiratory specimens during the acute phase of infection. The lowest  concentration of SARS-CoV-2 viral copies this assay can detect is 250  copies / mL. A negative result does not preclude SARS-CoV-2 infection  and should not be used as the sole basis for treatment or other  patient management decisions.  A negative result may occur with  improper specimen collection / handling, submission of specimen other  than nasopharyngeal swab, presence of viral mutation(s) within the  areas targeted by this assay, and inadequate number of viral copies  (<250 copies / mL). A negative result must be combined with clinical  observations, patient history, and epidemiological information. If result is POSITIVE SARS-CoV-2 target nucleic acids are DETECTED. The SARS-CoV-2 RNA is generally detectable in upper and lower  respiratory specimens dur ing  the acute phase of infection.  Positive  results are indicative of active infection with SARS-CoV-2.  Clinical  correlation with patient history and other diagnostic information is  necessary to determine patient infection status.  Positive results do  not rule out bacterial infection or co-infection with other viruses. If result is PRESUMPTIVE POSTIVE SARS-CoV-2 nucleic acids MAY BE PRESENT.   A presumptive positive result was obtained on the submitted specimen  and confirmed on repeat testing.  While 2019 novel coronavirus  (SARS-CoV-2) nucleic acids may be present in the submitted sample  additional confirmatory testing may be necessary for epidemiological  and / or clinical management purposes  to differentiate between  SARS-CoV-2 and other Sarbecovirus currently known to infect humans.  If clinically indicated additional testing with an alternate test  methodology [phone]) is advised. The SARS-CoV-2 RNA is generally  detectable in upper and lower respiratory sp ecimens during the acute  phase of infection. The expected result is Negative. Fact Sheet for Patients:  StrictlyIdeas.no Fact Sheet for Healthcare Providers: BankingDealers.co.za This test is not yet approved or cleared by the [place] FDA and has been authorized for detection and/or diagnosis of SARS-CoV-2 by FDA under an Emergency Use Authorization (EUA).  This EUA will remain in effect (meaning this test can be used) for the duration of the COVID-19 declaration under Section 564(b)(1) of the Act, 21 U.S.C. section 360bbb-3(b)(1), unless  the authorization is terminated or revoked sooner. Performed at [facility], [address] [place], [place] [address]   Lipid panel     Status: Abnormal   Collection Time: 03/10/19  5:10 AM  Result Value Ref Range   Cholesterol 189 0 - 200 mg/dL   Triglycerides 110 <150 mg/dL   HDL 35 (L) >40 mg/dL   Total CHOL/HDL Ratio 5.4  RATIO   VLDL 22 0 - 40 mg/dL   LDL Cholesterol 132 (H) 0 - 99 mg/dL    Comment:        Total Cholesterol/HDL:CHD Risk Coronary Heart Disease Risk Table                     Men   Women  1/2 Average Risk   3.4   3.3  Average Risk       5.0   4.4  2 X Average Risk   9.6   7.1  3 X Average Risk  23.4   11.0        Use the calculated Patient Ratio above and the CHD Risk Table to determine the patient's CHD Risk.        ATP III CLASSIFICATION (LDL):  <100     mg/dL   Optimal  100-129  mg/dL   Near or Above                    Optimal  130-159  mg/dL   Borderline  160-189  mg/dL   High  >190     mg/dL   Very High Performed at [facility] [place], [place] [address]    Ct Angio Head W Or Wo Contrast  Result Date: 03/10/2019 CLINICAL DATA:  Code stroke. Dizziness and headache. Blurry vision. EXAM: CT HEAD WITHOUT CONTRAST CT ANGIOGRAPHY OF THE HEAD AND NECK TECHNIQUE: Contiguous axial images were obtained from the base of the skull through the vertex without intravenous contrast. Multidetector CT imaging of the head and neck was performed using the standard protocol during bolus administration of intravenous contrast. Multiplanar CT image reconstructions and MIPs were obtained to evaluate the vascular anatomy. Carotid stenosis measurements (when applicable) are obtained utilizing NASCET criteria, using the distal internal carotid diameter as the denominator. CONTRAST:  136mL OMNIPAQUE IOHEXOL 350 MG/ML SOLN COMPARISON:  Head CT 01/05/2013 FINDINGS: CT HEAD FINDINGS Brain: There is no mass, hemorrhage or extra-axial collection. The size and configuration of the ventricles and extra-axial CSF spaces are normal. 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 No evidence of acute cortical infarct. Vascular: No abnormal hyperdensity of the major intracranial arteries or dural venous sinuses. No intracranial atherosclerosis. Skull: The visualized skull  base, calvarium and extracranial soft tissues are normal. Sinuses/Orbits: No fluid levels or advanced mucosal thickening of the visualized paranasal sinuses. No mastoid or middle ear effusion. The orbits are normal. ASPECTS ([place] Stroke Program Early CT Score) - Ganglionic level infarction (caudate, lentiform nuclei, internal capsule, insula, M1-M3 cortex): 7 - Supraganglionic infarction (M4-M6 cortex): 3 Total score (0-10 with 10 being normal): 10 CTA NECK FINDINGS SKELETON: There is no bony spinal canal stenosis. No lytic or blastic lesion. OTHER NECK: Normal pharynx, larynx and major salivary glands. No cervical lymphadenopathy. Unremarkable thyroid gland. UPPER CHEST: No pneumothorax or pleural effusion. No nodules or masses. AORTIC ARCH: There is no calcific atherosclerosis of the aortic arch. There is no aneurysm, dissection or hemodynamically significant stenosis of the visualized  ascending aorta and aortic arch. Conventional 3 vessel aortic branching pattern. The visualized proximal subclavian arteries are widely patent. RIGHT CAROTID SYSTEM: --Common carotid artery: Widely patent origin without common carotid artery dissection or aneurysm. --Internal carotid artery: Normal without aneurysm, dissection or stenosis. --External carotid artery: No acute abnormality. LEFT CAROTID SYSTEM: --Common carotid artery: Widely patent origin without common carotid artery dissection or aneurysm. --Internal carotid artery: Normal without aneurysm, dissection or stenosis. --External carotid artery: No acute abnormality. VERTEBRAL ARTERIES: Right dominant configuration. Both origins are normal. No dissection, occlusion or flow-limiting stenosis to the vertebrobasilar confluence. CTA HEAD FINDINGS POSTERIOR CIRCULATION: --Vertebral arteries: Normal codominant configuration of V4 segments. --Posterior inferior cerebellar arteries (PICA): Patent origins from the vertebral arteries. --Anterior inferior cerebellar arteries  (AICA): Patent origins from the basilar artery. --Basilar artery: Normal. --Superior cerebellar arteries: Normal. --Posterior cerebral arteries (PCA): Normal. Both are predominantly supplied by the posterior communicating arteries (p-comm). ANTERIOR CIRCULATION: --Intracranial internal carotid arteries: Normal. --Anterior cerebral arteries (ACA): Normal. Both A1 segments are present. Patent anterior communicating artery (a-comm). --Middle cerebral arteries (MCA): Normal. VENOUS SINUSES: As permitted by contrast timing, patent. ANATOMIC VARIANTS: None Review of the MIP images confirms the above findings. IMPRESSION: 1. No acute hemorrhage.  Is 10. 2. No emergent large vessel occlusion or high-grade stenosis. These results were called by telephone at the time of interpretation on 03/10/2019 at 1:03 am to Dr. Ezequiel Essex , who verbally acknowledged these results. Electronically Signed   By: Ulyses Jarred M.D.   On: 03/10/2019 01:14   Ct Angio Neck W Or Wo Contrast  Result Date: 03/10/2019 CLINICAL DATA:  Code stroke. Dizziness and headache. Blurry vision. EXAM: CT HEAD WITHOUT CONTRAST CT ANGIOGRAPHY OF THE HEAD AND NECK TECHNIQUE: Contiguous axial images were obtained from the base of the skull through the vertex without intravenous contrast. Multidetector CT imaging of the head and neck was performed using the standard protocol during bolus administration of intravenous contrast. Multiplanar CT image reconstructions and MIPs were obtained to evaluate the vascular anatomy. Carotid stenosis measurements (when applicable) are obtained utilizing NASCET criteria, using the distal internal carotid diameter as the denominator. CONTRAST:  126mL OMNIPAQUE IOHEXOL 350 MG/ML SOLN COMPARISON:  Head CT 01/05/2013 FINDINGS: CT HEAD FINDINGS Brain: There is no mass, hemorrhage or extra-axial collection. The size and configuration of the ventricles and extra-axial CSF spaces are normal. There is an old infarct of the right  parietal lobe, unchanged since 01/05/2013. No evidence of acute cortical infarct. Vascular: No abnormal hyperdensity of the major intracranial arteries or dural venous sinuses. No intracranial atherosclerosis. Skull: The visualized skull base, calvarium and extracranial soft tissues are normal. Sinuses/Orbits: No fluid levels or advanced mucosal thickening of the visualized paranasal sinuses. No mastoid or middle ear effusion. The orbits are normal. ASPECTS ([place] Stroke Program Early CT Score) - Ganglionic level infarction (caudate, lentiform nuclei, internal capsule, insula, M1-M3 cortex): 7 - Supraganglionic infarction (M4-M6 cortex): 3 Total score (0-10 with 10 being normal): 10 CTA NECK FINDINGS SKELETON: There is no bony spinal canal stenosis. No lytic or blastic lesion. OTHER NECK: Normal pharynx, larynx and major salivary glands. No cervical lymphadenopathy. Unremarkable thyroid gland. UPPER CHEST: No pneumothorax or pleural effusion. No nodules or masses. AORTIC ARCH: There is no calcific atherosclerosis of the aortic arch. There is no aneurysm, dissection or hemodynamically significant stenosis of the visualized ascending aorta and aortic arch. Conventional 3 vessel aortic branching pattern. The visualized proximal subclavian arteries are widely patent. RIGHT CAROTID SYSTEM: --Common carotid  artery: Widely patent origin without common carotid artery dissection or aneurysm. --Internal carotid artery: Normal without aneurysm, dissection or stenosis. --External carotid artery: No acute abnormality. LEFT CAROTID SYSTEM: --Common carotid artery: Widely patent origin without common carotid artery dissection or aneurysm. --Internal carotid artery: Normal without aneurysm, dissection or stenosis. --External carotid artery: No acute abnormality. VERTEBRAL ARTERIES: Right dominant configuration. Both origins are normal. No dissection, occlusion or flow-limiting stenosis to the vertebrobasilar confluence. CTA HEAD  FINDINGS POSTERIOR CIRCULATION: --Vertebral arteries: Normal codominant configuration of V4 segments. --Posterior inferior cerebellar arteries (PICA): Patent origins from the vertebral arteries. --Anterior inferior cerebellar arteries (AICA): Patent origins from the basilar artery. --Basilar artery: Normal. --Superior cerebellar arteries: Normal. --Posterior cerebral arteries (PCA): Normal. Both are predominantly supplied by the posterior communicating arteries (p-comm). ANTERIOR CIRCULATION: --Intracranial internal carotid arteries: Normal. --Anterior cerebral arteries (ACA): Normal. Both A1 segments are present. Patent anterior communicating artery (a-comm). --Middle cerebral arteries (MCA): Normal. VENOUS SINUSES: As permitted by contrast timing, patent. ANATOMIC VARIANTS: None Review of the MIP images confirms the above findings. IMPRESSION: 1. No acute hemorrhage.  Is 10. 2. No emergent large vessel occlusion or high-grade stenosis. These results were called by telephone at the time of interpretation on 03/10/2019 at 1:03 am to Dr. Ezequiel Essex , who verbally acknowledged these results. Electronically Signed   By: Ulyses Jarred M.D.   On: 03/10/2019 01:14   Ct Head Code Stroke Wo Contrast  Result Date: 03/10/2019 CLINICAL DATA:  Code stroke. Dizziness and headache. Blurry vision. EXAM: CT HEAD WITHOUT CONTRAST CT ANGIOGRAPHY OF THE HEAD AND NECK TECHNIQUE: Contiguous axial images were obtained from the base of the skull through the vertex without intravenous contrast. Multidetector CT imaging of the head and neck was performed using the standard protocol during bolus administration of intravenous contrast. Multiplanar CT image reconstructions and MIPs were obtained to evaluate the vascular anatomy. Carotid stenosis measurements (when applicable) are obtained utilizing NASCET criteria, using the distal internal carotid diameter as the denominator. CONTRAST:  115mL OMNIPAQUE IOHEXOL 350 MG/ML SOLN  COMPARISON:  Head CT 01/05/2013 FINDINGS: CT HEAD FINDINGS Brain: There is no mass, hemorrhage or extra-axial collection. The size and configuration of the ventricles and extra-axial CSF spaces are normal. 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 No evidence of acute cortical infarct. Vascular: No abnormal hyperdensity of the major intracranial arteries or dural venous sinuses. No intracranial atherosclerosis. Skull: The visualized skull base, calvarium and extracranial soft tissues are normal. Sinuses/Orbits: No fluid levels or advanced mucosal thickening of the visualized paranasal sinuses. No mastoid or middle ear effusion. The orbits are normal. ASPECTS ([place] Stroke Program Early CT Score) - Ganglionic level infarction (caudate, lentiform nuclei, internal capsule, insula, M1-M3 cortex): 7 - Supraganglionic infarction (M4-M6 cortex): 3 Total score (0-10 with 10 being normal): 10 CTA NECK FINDINGS SKELETON: There is no bony spinal canal stenosis. No lytic or blastic lesion. OTHER NECK: Normal pharynx, larynx and major salivary glands. No cervical lymphadenopathy. Unremarkable thyroid gland. UPPER CHEST: No pneumothorax or pleural effusion. No nodules or masses. AORTIC ARCH: There is no calcific atherosclerosis of the aortic arch. There is no aneurysm, dissection or hemodynamically significant stenosis of the visualized ascending aorta and aortic arch. Conventional 3 vessel aortic branching pattern. The visualized proximal subclavian arteries are widely patent. RIGHT CAROTID SYSTEM: --Common carotid artery: Widely patent origin without common carotid artery dissection or aneurysm. --Internal carotid artery: Normal without aneurysm, dissection or stenosis. --External carotid artery: No acute  abnormality. LEFT CAROTID SYSTEM: --Common carotid artery: Widely patent origin without common carotid artery dissection or aneurysm. --Internal carotid artery: Normal without aneurysm,  dissection or stenosis. --External carotid artery: No acute abnormality. VERTEBRAL ARTERIES: Right dominant configuration. Both origins are normal. No dissection, occlusion or flow-limiting stenosis to the vertebrobasilar confluence. CTA HEAD FINDINGS POSTERIOR CIRCULATION: --Vertebral arteries: Normal codominant configuration of V4 segments. --Posterior inferior cerebellar arteries (PICA): Patent origins from the vertebral arteries. --Anterior inferior cerebellar arteries (AICA): Patent origins from the basilar artery. --Basilar artery: Normal. --Superior cerebellar arteries: Normal. --Posterior cerebral arteries (PCA): Normal. Both are predominantly supplied by the posterior communicating arteries (p-comm). ANTERIOR CIRCULATION: --Intracranial internal carotid arteries: Normal. --Anterior cerebral arteries (ACA): Normal. Both A1 segments are present. Patent anterior communicating artery (a-comm). --Middle cerebral arteries (MCA): Normal. VENOUS SINUSES: As permitted by contrast timing, patent. ANATOMIC VARIANTS: None Review of the MIP images confirms the above findings. IMPRESSION: 1. No acute hemorrhage.  Is 10. 2. No emergent large vessel occlusion or high-grade stenosis. These results were called by telephone at the time of interpretation on 03/10/2019 at 1:03 am to Dr. Ezequiel Essex , who verbally acknowledged these results. Electronically Signed   By: Ulyses Jarred M.D.   On: 03/10/2019 01:14    Pending Labs Unresulted Labs (From admission, onward)    Start     Ordered   03/10/19 0500  Hemoglobin A1c  Tomorrow morning,   R     03/10/19 0342   03/10/19 0339  HIV antibody (Routine Testing)  Once,   R     03/10/19 0342          Vitals/Pain Today's Vitals   03/10/19 0645 03/10/19 0700 03/10/19 0715 03/10/19 0730  BP: (!) 142/95 (!) 140/95 (!) 141/94 (!) 136/92  Pulse: 61 64 62 64  Resp: 15 13 14 13   Temp:      TempSrc:      SpO2: 99% 97% 97% 100%  PainSc:        Isolation  Precautions No active isolations  Medications Medications  ketorolac (TORADOL) 30 MG/ML injection 30 mg (has no administration in time range)   stroke: mapping our early stages of recovery book (0 each Does not apply Hold 03/10/19 0404)  acetaminophen (TYLENOL) tablet 650 mg (has no administration in time range)    Or  acetaminophen (TYLENOL) solution 650 mg (has no administration in time range)    Or  acetaminophen (TYLENOL) suppository 650 mg (has no administration in time range)  iohexol (OMNIPAQUE) 350 MG/ML injection 100 mL (100 mLs Intravenous Contrast Given 03/10/19 0036)  ketorolac (TORADOL) 30 MG/ML injection 30 mg (30 mg Intravenous Given 03/10/19 0120)  metoCLOPramide (REGLAN) injection 10 mg (10 mg Intravenous Given 03/10/19 0120)  diphenhydrAMINE (BENADRYL) injection 25 mg (25 mg Intravenous Given 03/10/19 0120)    Mobility walks Low fall risk   Focused Assessments    R Recommendations: See Admitting Provider Note  Report given to Additional Notes:

## 2019-03-10 NOTE — H&P (Signed)
History and Physical    Natasha Paulson QQV:[id number] DOB: [birth year] DOA: 03/09/2019  PCP: Abran Richard, MD   Patient coming from: Home.  I have personally briefly reviewed patient's old medical records in [place]  Chief Complaint: Headache.  HPI: Cody Shepard is a 53 y.o. male with medical history significant of asthma, bronchitis, COPD, CAD, type 2 diabetes, headache, stage III CKD, hypertension, hyperlipidemia who is coming to the emergency department due to progressively worse headache since early afternoon associated with blurred vision and dizziness.  He had a similar episode about a year or more ago.  He denies visual scotomas, photophobia or phonophobia.  No fever, chills, sore throat, rhinorrhea, wheezing, hemoptysis, chest pain, palpitations, dyspnea, diaphoresis, PND, orthopnea or pitting edema of the lower extremities.  He denies abdominal pain, vomiting, diarrhea, constipation, melena or hematochezia.  No dysuria, frequency or hematuria.  No polyuria, polydipsia, polyphagia or blurred vision.  Denies any skin rashes or pruritus.  ED Course: Initial vital signs temperature 97.8 F, pulse 72, respirations 15, blood pressure 125/92 mmHg and O2 sat 100% on room air.  The patient received Toradol 30 mg IVP x1, metoclopramide 10 mg IVP x1 and Benadryl 25 mg IVP.  Urinalysis was normal.  UDS was negative.  White count is 4.8, hemoglobin 10.4 g/dL and platelets 214.  PT/INR/APTT are within expected levels.  Alcohol level was normal.  Troponin was normal.  Sodium is 133, potassium 3.5, chloride 100 and CO2 22 mmol/L.  Imaging: CT head, CTA head and neck did not show any acute strokes or bleeds.  Please see attached reports below.  Review of Systems: As per HPI otherwise 10 point review of systems negative.  Past Medical History:  Diagnosis Date   Asthma    Bronchitis    COPD (chronic obstructive pulmonary disease) ([place])    Coronary artery disease    Diabetes mellitus  without complication ([place])    Headache    High cholesterol    Hypertension     Past Surgical History:  Procedure Laterality Date   LAPAROSCOPIC ADRENALECTOMY Left 01/04/2019   Procedure: LAPAROSCOPIC LEFT ADRENALECTOMY;  Surgeon: Armandina Gemma, MD;  Location: [facility];  Service: General;  Laterality: Left;   NO PAST SURGERIES       reports that he quit smoking about 21 years ago. His smoking use included cigarettes. He smoked 1.00 pack per day. He has never used smokeless tobacco. He reports that he does not drink alcohol or use drugs.  No Known Allergies  Family History  Problem Relation Age of Onset   Diabetes Mother    Hypertension Mother    Stroke Father    Prior to Admission medications   Medication Sig Start Date End Date Taking? Authorizing Provider  albuterol (PROVENTIL HFA;VENTOLIN HFA) 108 (90 BASE) MCG/ACT inhaler Inhale 2 puffs into the lungs every 4 (four) hours as needed for wheezing or shortness of breath.    Yes [provider]  Ascorbic Acid (VITAMIN C) 1000 MG tablet Take 1,000 mg by mouth daily.   Yes [provider]  atorvastatin (LIPITOR) 20 MG tablet Take 20 mg by mouth at bedtime.    Yes [provider]  carvedilol (COREG) 25 MG tablet Take 25 mg by mouth 2 (two) times daily with a meal.   Yes [provider]  colchicine 0.6 MG tablet Take 0.6 mg by mouth 2 (two) times daily.   Yes [provider]  fluticasone (FLONASE) 50 MCG/ACT nasal spray Place 2  sprays into both nostrils daily.   Yes [provider]  Fluticasone-Umeclidin-Vilant (TRELEGY ELLIPTA) 100-62.5-25 MCG/INH AEPB Inhale 1 Inhaler into the lungs daily.   Yes [provider]  hydrALAZINE (APRESOLINE) 100 MG tablet Take 100 mg by mouth 3 (three) times daily.   Yes [provider]  olmesartan (BENICAR) 40 MG tablet Take 40 mg by mouth daily.   Yes [provider]  cloNIDine (CATAPRES - DOSED IN MG/24 HR) 0.3 mg/24hr  patch Place 0.3 mg onto the skin every Sunday.     [provider]  fexofenadine (ALLEGRA) 180 MG tablet Take 180 mg by mouth daily.    [provider]  ipratropium-albuterol (DUONEB) 0.5-2.5 (3) MG/3ML SOLN Take 3 mLs by nebulization 3 (three) times daily.    [provider]  isosorbide mononitrate (IMDUR) 60 MG 24 hr tablet Take 30 mg by mouth 2 (two) times daily.     [provider]  metFORMIN (GLUCOPHAGE) 1000 MG tablet Take 500 mg by mouth 2 (two) times daily.     [provider]  montelukast (SINGULAIR) 10 MG tablet Take 10 mg by mouth daily.    [provider]  naproxen sodium (ALEVE) 220 MG tablet Take 440 mg by mouth 2 (two) times daily as needed (pain).    [provider]  oxymetazoline (AFRIN) 0.05 % nasal spray Place 1 spray into both nostrils 2 (two) times daily as needed for congestion.    [provider]  potassium chloride (KLOR-CON) 20 MEQ packet Take 20 mEq by mouth once.    [provider]  ranolazine (RANEXA) 500 MG 12 hr tablet Take 500 mg by mouth 2 (two) times daily.    [provider]  traMADol (ULTRAM) 50 MG tablet Take 1-2 tablets (50-100 mg total) by mouth every 6 (six) hours as needed. 01/06/19   Armandina Gemma, MD    Physical Exam: Vitals:   03/10/19 0245 03/10/19 0300 03/10/19 0315 03/10/19 0400  BP: 135/88 (!) 130/94 (!) 124/96 (!) 128/91  Pulse: 62 70 67 64  Resp: 14 14 10  (!) 23  Temp:      TempSrc:      SpO2: 99% 98% 98% 99%    Constitutional: NAD, calm, comfortable Eyes: PERRL, lids and conjunctivae normal ENMT: Mucous membranes are moist. Posterior pharynx clear of any exudate or lesions. Neck: normal, supple, no masses, no thyromegaly Respiratory: clear to auscultation bilaterally, no wheezing, no crackles. Normal respiratory effort. No accessory muscle use.  Cardiovascular: Regular rate and rhythm, no murmurs / rubs / gallops. No extremity edema. 2+ pedal pulses.  No carotid bruits.  Abdomen: no tenderness, no masses palpated. No hepatosplenomegaly. Bowel sounds positive.  Musculoskeletal: no clubbing / cyanosis. No joint deformity upper and lower extremities. Good ROM, no contractures. Normal muscle tone.  Skin: no rashes, lesions, ulcers. No induration Neurologic: CN 2-12 grossly intact. Sensation intact, DTR normal.  Generalized weakness, but no other significant finding.  Left hand grip my had improved. Psychiatric: Normal judgment and insight. Alert and oriented x 3. Normal mood.   Labs on Admission: I have personally reviewed following labs and imaging studies  CBC: Recent Labs  Lab 03/10/19 0010  WBC 4.8  NEUTROABS 1.6*  HGB 10.4*  HCT 31.1*  MCV 92.6  PLT 720   Basic Metabolic Panel: Recent Labs  Lab 03/10/19 0010 03/10/19 0028  NA 133*  --   K 3.5  --   CL 100  --   CO2 22  --  GLUCOSE 106*  --   BUN 18  --   CREATININE 1.82* 1.60*  CALCIUM 8.8*  --    GFR: CrCl cannot be calculated (Unknown ideal weight.). Liver Function Tests: No results for input(s): AST, ALT, ALKPHOS, BILITOT, PROT, ALBUMIN in the last 168 hours. No results for input(s): LIPASE, AMYLASE in the last 168 hours. No results for input(s): AMMONIA in the last 168 hours. Coagulation Profile: Recent Labs  Lab 03/10/19 0010  INR 1.0   Cardiac Enzymes: Recent Labs  Lab 03/10/19 0010  TROPONINI <0.03   BNP (last 3 results) No results for input(s): PROBNP in the last 8760 hours. HbA1C: No results for input(s): HGBA1C in the last 72 hours. CBG: No results for input(s): GLUCAP in the last 168 hours. Lipid Profile: No results for input(s): CHOL, HDL, LDLCALC, TRIG, CHOLHDL, LDLDIRECT in the last 72 hours. Thyroid Function Tests: No results for input(s): TSH, T4TOTAL, FREET4, T3FREE, THYROIDAB in the last 72 hours. Anemia Panel: No results for input(s): VITAMINB12, FOLATE, FERRITIN, TIBC, IRON, RETICCTPCT in the last 72 hours. Urine analysis:      Component Value Date/Time   COLORURINE YELLOW 03/10/2019 0012   APPEARANCEUR CLEAR 03/10/2019 0012   LABSPEC 1.008 03/10/2019 0012   PHURINE 5.0 03/10/2019 0012   GLUCOSEU NEGATIVE 03/10/2019 0012   HGBUR NEGATIVE 03/10/2019 0012   BILIRUBINUR NEGATIVE 03/10/2019 0012   KETONESUR NEGATIVE 03/10/2019 0012   PROTEINUR NEGATIVE 03/10/2019 0012   NITRITE NEGATIVE 03/10/2019 0012   LEUKOCYTESUR NEGATIVE 03/10/2019 0012    Radiological Exams on Admission: Ct Angio Head W Or Wo Contrast  Result Date: 03/10/2019 CLINICAL DATA:  Code stroke. Dizziness and headache. Blurry vision. EXAM: CT HEAD WITHOUT CONTRAST CT ANGIOGRAPHY OF THE HEAD AND NECK TECHNIQUE: Contiguous axial images were obtained from the base of the skull through the vertex without intravenous contrast. Multidetector CT imaging of the head and neck was performed using the standard protocol during bolus administration of intravenous contrast. Multiplanar CT image reconstructions and MIPs were obtained to evaluate the vascular anatomy. Carotid stenosis measurements (when applicable) are obtained utilizing NASCET criteria, using the distal internal carotid diameter as the denominator. CONTRAST:  120mL OMNIPAQUE IOHEXOL 350 MG/ML SOLN COMPARISON:  Head CT 01/05/2013 FINDINGS: CT HEAD FINDINGS Brain: There is no mass, hemorrhage or extra-axial collection. The size and configuration of the ventricles and extra-axial CSF spaces are normal. 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 No evidence of acute cortical infarct. Vascular: No abnormal hyperdensity of the major intracranial arteries or dural venous sinuses. No intracranial atherosclerosis. Skull: The visualized skull base, calvarium and extracranial soft tissues are normal. Sinuses/Orbits: No fluid levels or advanced mucosal thickening of the visualized paranasal sinuses. No mastoid or middle ear effusion. The orbits are normal. ASPECTS ([place] Stroke  Program Early CT Score) - Ganglionic level infarction (caudate, lentiform nuclei, internal capsule, insula, M1-M3 cortex): 7 - Supraganglionic infarction (M4-M6 cortex): 3 Total score (0-10 with 10 being normal): 10 CTA NECK FINDINGS SKELETON: There is no bony spinal canal stenosis. No lytic or blastic lesion. OTHER NECK: Normal pharynx, larynx and major salivary glands. No cervical lymphadenopathy. Unremarkable thyroid gland. UPPER CHEST: No pneumothorax or pleural effusion. No nodules or masses. AORTIC ARCH: There is no calcific atherosclerosis of the aortic arch. There is no aneurysm, dissection or hemodynamically significant stenosis of the visualized ascending aorta and aortic arch. Conventional 3 vessel aortic branching pattern. The visualized proximal subclavian arteries are widely patent. RIGHT CAROTID SYSTEM: --  Common carotid artery: Widely patent origin without common carotid artery dissection or aneurysm. --Internal carotid artery: Normal without aneurysm, dissection or stenosis. --External carotid artery: No acute abnormality. LEFT CAROTID SYSTEM: --Common carotid artery: Widely patent origin without common carotid artery dissection or aneurysm. --Internal carotid artery: Normal without aneurysm, dissection or stenosis. --External carotid artery: No acute abnormality. VERTEBRAL ARTERIES: Right dominant configuration. Both origins are normal. No dissection, occlusion or flow-limiting stenosis to the vertebrobasilar confluence. CTA HEAD FINDINGS POSTERIOR CIRCULATION: --Vertebral arteries: Normal codominant configuration of V4 segments. --Posterior inferior cerebellar arteries (PICA): Patent origins from the vertebral arteries. --Anterior inferior cerebellar arteries (AICA): Patent origins from the basilar artery. --Basilar artery: Normal. --Superior cerebellar arteries: Normal. --Posterior cerebral arteries (PCA): Normal. Both are predominantly supplied by the posterior communicating arteries (p-comm).  ANTERIOR CIRCULATION: --Intracranial internal carotid arteries: Normal. --Anterior cerebral arteries (ACA): Normal. Both A1 segments are present. Patent anterior communicating artery (a-comm). --Middle cerebral arteries (MCA): Normal. VENOUS SINUSES: As permitted by contrast timing, patent. ANATOMIC VARIANTS: None Review of the MIP images confirms the above findings. IMPRESSION: 1. No acute hemorrhage.  Is 10. 2. No emergent large vessel occlusion or high-grade stenosis. These results were called by telephone at the time of interpretation on 03/10/2019 at 1:03 am to Dr. Ezequiel Essex , who verbally acknowledged these results. Electronically Signed   By: Ulyses Jarred M.D.   On: 03/10/2019 01:14   Ct Angio Neck W Or Wo Contrast  Result Date: 03/10/2019 CLINICAL DATA:  Code stroke. Dizziness and headache. Blurry vision. EXAM: CT HEAD WITHOUT CONTRAST CT ANGIOGRAPHY OF THE HEAD AND NECK TECHNIQUE: Contiguous axial images were obtained from the base of the skull through the vertex without intravenous contrast. Multidetector CT imaging of the head and neck was performed using the standard protocol during bolus administration of intravenous contrast. Multiplanar CT image reconstructions and MIPs were obtained to evaluate the vascular anatomy. Carotid stenosis measurements (when applicable) are obtained utilizing NASCET criteria, using the distal internal carotid diameter as the denominator. CONTRAST:  173mL OMNIPAQUE IOHEXOL 350 MG/ML SOLN COMPARISON:  Head CT 01/05/2013 FINDINGS: CT HEAD FINDINGS Brain: There is no mass, hemorrhage or extra-axial collection. The size and configuration of the ventricles and extra-axial CSF spaces are normal. 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 No evidence of acute cortical infarct. Vascular: No abnormal hyperdensity of the major intracranial arteries or dural venous sinuses. No intracranial atherosclerosis. Skull: The visualized skull base,  calvarium and extracranial soft tissues are normal. Sinuses/Orbits: No fluid levels or advanced mucosal thickening of the visualized paranasal sinuses. No mastoid or middle ear effusion. The orbits are normal. ASPECTS ([place] Stroke Program Early CT Score) - Ganglionic level infarction (caudate, lentiform nuclei, internal capsule, insula, M1-M3 cortex): 7 - Supraganglionic infarction (M4-M6 cortex): 3 Total score (0-10 with 10 being normal): 10 CTA NECK FINDINGS SKELETON: There is no bony spinal canal stenosis. No lytic or blastic lesion. OTHER NECK: Normal pharynx, larynx and major salivary glands. No cervical lymphadenopathy. Unremarkable thyroid gland. UPPER CHEST: No pneumothorax or pleural effusion. No nodules or masses. AORTIC ARCH: There is no calcific atherosclerosis of the aortic arch. There is no aneurysm, dissection or hemodynamically significant stenosis of the visualized ascending aorta and aortic arch. Conventional 3 vessel aortic branching pattern. The visualized proximal subclavian arteries are widely patent. RIGHT CAROTID SYSTEM: --Common carotid artery: Widely patent origin without common carotid artery dissection or aneurysm. --Internal carotid artery: Normal without aneurysm, dissection or stenosis. --External carotid  artery: No acute abnormality. LEFT CAROTID SYSTEM: --Common carotid artery: Widely patent origin without common carotid artery dissection or aneurysm. --Internal carotid artery: Normal without aneurysm, dissection or stenosis. --External carotid artery: No acute abnormality. VERTEBRAL ARTERIES: Right dominant configuration. Both origins are normal. No dissection, occlusion or flow-limiting stenosis to the vertebrobasilar confluence. CTA HEAD FINDINGS POSTERIOR CIRCULATION: --Vertebral arteries: Normal codominant configuration of V4 segments. --Posterior inferior cerebellar arteries (PICA): Patent origins from the vertebral arteries. --Anterior inferior cerebellar arteries (AICA):  Patent origins from the basilar artery. --Basilar artery: Normal. --Superior cerebellar arteries: Normal. --Posterior cerebral arteries (PCA): Normal. Both are predominantly supplied by the posterior communicating arteries (p-comm). ANTERIOR CIRCULATION: --Intracranial internal carotid arteries: Normal. --Anterior cerebral arteries (ACA): Normal. Both A1 segments are present. Patent anterior communicating artery (a-comm). --Middle cerebral arteries (MCA): Normal. VENOUS SINUSES: As permitted by contrast timing, patent. ANATOMIC VARIANTS: None Review of the MIP images confirms the above findings. IMPRESSION: 1. No acute hemorrhage.  Is 10. 2. No emergent large vessel occlusion or high-grade stenosis. These results were called by telephone at the time of interpretation on 03/10/2019 at 1:03 am to Dr. Ezequiel Essex , who verbally acknowledged these results. Electronically Signed   By: Ulyses Jarred M.D.   On: 03/10/2019 01:14   Ct Head Code Stroke Wo Contrast  Result Date: 03/10/2019 CLINICAL DATA:  Code stroke. Dizziness and headache. Blurry vision. EXAM: CT HEAD WITHOUT CONTRAST CT ANGIOGRAPHY OF THE HEAD AND NECK TECHNIQUE: Contiguous axial images were obtained from the base of the skull through the vertex without intravenous contrast. Multidetector CT imaging of the head and neck was performed using the standard protocol during bolus administration of intravenous contrast. Multiplanar CT image reconstructions and MIPs were obtained to evaluate the vascular anatomy. Carotid stenosis measurements (when applicable) are obtained utilizing NASCET criteria, using the distal internal carotid diameter as the denominator. CONTRAST:  172mL OMNIPAQUE IOHEXOL 350 MG/ML SOLN COMPARISON:  Head CT 01/05/2013 FINDINGS: CT HEAD FINDINGS Brain: There is no mass, hemorrhage or extra-axial collection. The size and configuration of the ventricles and extra-axial CSF spaces are normal. There is an old infarct of the right parietal  lobe, unchanged since 01/05/2013. No evidence of acute cortical infarct. Vascular: No abnormal hyperdensity of the major intracranial arteries or dural venous sinuses. No intracranial atherosclerosis. Skull: The visualized skull base, calvarium and extracranial soft tissues are normal. Sinuses/Orbits: No fluid levels or advanced mucosal thickening of the visualized paranasal sinuses. No mastoid or middle ear effusion. The orbits are normal. ASPECTS ([place] Stroke Program Early CT Score) - Ganglionic level infarction (caudate, lentiform nuclei, internal capsule, insula, M1-M3 cortex): 7 - Supraganglionic infarction (M4-M6 cortex): 3 Total score (0-10 with 10 being normal): 10 CTA NECK FINDINGS SKELETON: There is no bony spinal canal stenosis. No lytic or blastic lesion. OTHER NECK: Normal pharynx, larynx and major salivary glands. No cervical lymphadenopathy. Unremarkable thyroid gland. UPPER CHEST: No pneumothorax or pleural effusion. No nodules or masses. AORTIC ARCH: There is no calcific atherosclerosis of the aortic arch. There is no aneurysm, dissection or hemodynamically significant stenosis of the visualized ascending aorta and aortic arch. Conventional 3 vessel aortic branching pattern. The visualized proximal subclavian arteries are widely patent. RIGHT CAROTID SYSTEM: --Common carotid artery: Widely patent origin without common carotid artery dissection or aneurysm. --Internal carotid artery: Normal without aneurysm, dissection or stenosis. --External carotid artery: No acute abnormality. LEFT CAROTID SYSTEM: --Common carotid artery: Widely patent origin without common carotid artery dissection or aneurysm. --Internal carotid artery: Normal without  aneurysm, dissection or stenosis. --External carotid artery: No acute abnormality. VERTEBRAL ARTERIES: Right dominant configuration. Both origins are normal. No dissection, occlusion or flow-limiting stenosis to the vertebrobasilar confluence. CTA HEAD FINDINGS  POSTERIOR CIRCULATION: --Vertebral arteries: Normal codominant configuration of V4 segments. --Posterior inferior cerebellar arteries (PICA): Patent origins from the vertebral arteries. --Anterior inferior cerebellar arteries (AICA): Patent origins from the basilar artery. --Basilar artery: Normal. --Superior cerebellar arteries: Normal. --Posterior cerebral arteries (PCA): Normal. Both are predominantly supplied by the posterior communicating arteries (p-comm). ANTERIOR CIRCULATION: --Intracranial internal carotid arteries: Normal. --Anterior cerebral arteries (ACA): Normal. Both A1 segments are present. Patent anterior communicating artery (a-comm). --Middle cerebral arteries (MCA): Normal. VENOUS SINUSES: As permitted by contrast timing, patent. ANATOMIC VARIANTS: None Review of the MIP images confirms the above findings. IMPRESSION: 1. No acute hemorrhage.  Is 10. 2. No emergent large vessel occlusion or high-grade stenosis. These results were called by telephone at the time of interpretation on 03/10/2019 at 1:03 am to Dr. Ezequiel Essex , who verbally acknowledged these results. Electronically Signed   By: Ulyses Jarred M.D.   On: 03/10/2019 01:14    EKG: Independently reviewed.  Vent. rate 74 BPM PR interval * ms QRS duration 89 ms QT/QTc 385/428 ms P-R-T axes 58 33 40 Sinus rhythm Consider left atrial enlargement Borderline T wave abnormalities  Assessment/Plan Principal Problem:   Headache Complicated migraine vs TIA/CVA. Observation/telemetry. Frequent neuro checks. Swallow screen. PT/OT/SLP. Check hemoglobin A1c and fasting lipid. Check echocardiogram. Check MRI of brain.  Active Problems:   Essential hypertension, benign Allow permissive hypertension. Monitor blood pressure closely. PRN IVP antihypertensive if BP rises.    COPD (chronic obstructive pulmonary disease) ([facility]) Asymptomatic at this time. Supplemental oxygen and bronchodilators as needed.     Hyperlipidemia Continue atorvastatin.    Type 2 diabetes mellitus ([facility]) Check hemoglobin A1c. Currently n.p.o. Carb modified/heart healthy modified diet. CBG monitoring before meals and bedtime. Hold metformin due to receiving IV contrast earlier.    Coronary artery disease Beta-blocker held to allow permissive hypertension. Continue atorvastatin and carvedilol. Will have echocardiogram as part of TIA/CVA work-up.   DVT prophylaxis: SCDs. Code Status: Full code. Family Communication: Disposition Plan: Observation for TIA work-up. Consults called: Tele-neurology evaluated the patient. Admission status: Observation/telemetry.   Reubin Milan MD [facility]  03/10/2019, 4:05 AM   This document was prepared using Dragon voice recognition software and may contain some unintended transcription errors.

## 2019-03-10 NOTE — Plan of Care (Signed)
progressing 

## 2019-03-10 NOTE — Progress Notes (Signed)
53 yo M transferred from [facility] for stroke w/u. Will continue as per H&P. Seen and examined this AM by myself. Denies current complaints. W/u as per H&P.   Cherylann Ratel, DO

## 2019-03-10 NOTE — Progress Notes (Signed)
*  PRELIMINARY RESULTS* Echocardiogram 2D Echocardiogram has been performed.  Leavy Cella 03/10/2019, 11:49 AM

## 2019-03-10 NOTE — ED Notes (Signed)
Pt has new left sided weakness with his grip strength. Pt reports this began yesterday around 1400. MD made aware.

## 2019-03-10 NOTE — Progress Notes (Signed)
Patient scan began [facility] Radiologist called [phone]  [facility] Dr. Wyvonnia Dusky [phone]

## 2019-03-10 NOTE — Consult Note (Addendum)
[facility] Tele[facility]urology Consult Services   Date of Service:   03/10/2019 00:49:02  Impression:     .  Small Vessel Infarct     .  Right Hemispheric Infarct     .  Complicated Migraine  Comments/Sign-Out: I discussed the case in detail with the patient. Also discussed the case with the staff and ED attending. I do not see any clear-cut deficits on exam. His NIH stroke scale is 0. He does have some decreased subjective grip in the left hand. Because of duration of symptoms, he is not a candidate for IV TPA. Clinical exam is not suggestive of a large vessel occlusion. CT angiogram of the head and neck has been ordered by the ED attending. We will follow on that and if it shows any abnormality, will treat accordingly although the clinical suspicion of large vessel occlusion is low. The prelim review of the CAT scan to me appears normal. If it shows any abnormality, will contact the ED again.  Mechanism of Stroke: Small Vessel Disease  Metrics: Last Known Well: 03/09/2019 14:00:00 [facility] Notification Time: 03/10/2019 00:49:02 Arrival Time: 03/09/2019 23:48:00 Stamp Time: 03/10/2019 00:49:02 Time First Login Attempt: 03/10/2019 00:53:54 Video Start Time: 03/10/2019 00:53:54  Symptoms: dizziness, HA NIHSS Start Assessment Time: 03/10/2019 00:57:38 Patient is not a candidate for tPA. Patient was not deemed candidate for tPA thrombolytics because of Last Well Known Above 4.5 Hours. Video End Time: 03/10/2019 01:12:28  CT head showed no acute hemorrhage or acute core infarct.  Lower Likelihood of Large Vessel Occlusion but Following Stat Studies are Recommended  CTA Head and Neck. CT Perfusion. Reviewed, No Indication of Large Vessel Occlusive Thrombus, Patient is not an NIR Candidate.   ED Physician notified of diagnostic impression and management plan on 03/10/2019 01:16:49  Our recommendations are outlined below.  Recommendations:     .  Activate  Stroke Protocol Admission/Order Set     .  Stroke/Telemetry Floor     .  Neuro Checks     .  Bedside Swallow Eval     .  DVT Prophylaxis     .  IV Fluids, Normal Saline     .  Head of Bed 30 Degrees     .  Euglycemia and Avoid Hyperthermia (PRN Acetaminophen)     .  Antiplatelet Therapy Recommended  Routine Consultation with [facility] for Follow up Care  Sign Out:     .  Discussed with Emergency Department Provider     .  Discussed with Rapid Response Team    ------------------------------------------------------------------------------  History of Present Illness: Patient is a 53 year old Male.  Patient was brought by EMS for symptoms of dizziness, HA  53 year old male with history of headaches came to the hospital because of dizziness. Patient reports around 2 PM he started having some dizziness, headache and blurry vision. He came to the hospital with the symptoms. Was noted to have some decreased grip in his left hand. Stroke alert was called. Patient denies any speech or swallowing problems. Denies any focal body weakness or numbness. Denies any gait or balance issues. Denies syncopal episodes. Denies any fall.  Last seen normal was within 4.5 hours. There is no history of hemorrhagic complications or intracranial hemorrhage. There is no history of Recent Anticoagulants. There is no history of recent major surgery. There is no history of recent stroke.  Examination: 1A: Level of Consciousness - Alert; keenly responsive + 0 1B: Ask Month and Age - Both Questions  Right + 0 1C: Blink Eyes & Squeeze Hands - Performs Both Tasks + 0 2: Test Horizontal Extraocular Movements - Normal + 0 3: Test Visual Fields - No Visual Loss + 0 4: Test Facial Palsy (Use Grimace if Obtunded) - Normal symmetry + 0 5A: Test Left Arm Motor Drift - No Drift for 10 Seconds + 0 5B: Test Right Arm Motor Drift - No Drift for 10 Seconds + 0 6A: Test Left Leg Motor Drift - No Drift for 5 Seconds  + 0 6B: Test Right Leg Motor Drift - No Drift for 5 Seconds + 0 7: Test Limb Ataxia (FNF/Heel-Shin) - No Ataxia + 0 8: Test Sensation - Normal; No sensory loss + 0 9: Test Language/Aphasia - Normal; No aphasia + 0 10: Test Dysarthria - Normal + 0 11: Test Extinction/Inattention - No abnormality + 0  NIHSS Score: 0  Patient/Family was informed the Neurology Consult would happen via TeleHealth consult by way of interactive audio and video telecommunications and consented to receiving care in this manner.  Due to the immediate potential for life-threatening deterioration due to underlying acute neurologic illness, I spent 35 minutes providing critical care. This time includes time for face to face visit via telemedicine, review of medical records, imaging studies and discussion of findings with providers, the patient and/or family.   Dr Faustino Congress   [facility] [phone]  Case [id number]  Addendum: CTA negative for LVO per radiology

## 2019-03-10 NOTE — ED Notes (Signed)
[facility] in to transport pt to [facility]

## 2019-03-10 NOTE — Progress Notes (Signed)
Patient's chart reviewed. Most recent vital signs demonstrating hemodynamically stability and overall not acute distress per nursing reports. Patient admitted after midnight secondary to acute episode of HA with associated blurred vision and dizziness. Patient with risk factors for CVA, including: HTN, diabetes and HLD; tele-neurology has advised admission for TIA work up and CVA rule out. Patient to be transfer to [facility] for MRI and further neurology evaluation. Please refer to H&P written by Dr. Olevia Bowens for further info/details on admission.  Plan: -transfer to [facility] for TIA/CVA work up -neurology consult  -allow permissive HTN with PRN parameters -follow clinical response   Barton Dubois MD [phone]

## 2019-03-10 NOTE — Evaluation (Signed)
Physical Therapy Evaluation Patient Details Name: Melody Savidge MRN: [id number] DOB: September 20, 1966 Today's Date: 03/10/2019   History of Present Illness  Pt is a 53 y/o male admitted from [facility] secondary to HA and work-up for stroke. MRI was Extensive white matter disease similar to prior studies. PMH including but not limited to CKD, HTN, DM and laproscopic adrenalectomy in 12/2018.     Clinical Impression  Pt presented supine in bed with HOB elevated, awake and willing to participate in therapy session. Prior to admission, pt reported that he was independent with all functional mobility and ADLs. Pt lives alone in a single level apartment on the ground floor. At the time of evaluation, pt at independent to supervision level with all mobility without use of an AD. Pt participated in a higher level balance assessment and scored a 24/24 on the DGI, indicating that he is a safe community ambulator. No further acute PT needs identified at this time. PT signing off.    Follow Up Recommendations No PT follow up    Equipment Recommendations  None recommended by PT    Recommendations for Other Services       Precautions / Restrictions Precautions Precautions: None Restrictions Weight Bearing Restrictions: No      Mobility  Bed Mobility Overal bed mobility: Independent                Transfers Overall transfer level: Needs assistance Equipment used: None Transfers: Sit to/from Stand Sit to Stand: Supervision         General transfer comment: supervision for safety  Ambulation/Gait Ambulation/Gait assistance: Supervision Gait Distance (Feet): 250 Feet Assistive device: None Gait Pattern/deviations: WFL(Within Functional Limits) Gait velocity: able to fluctuate   General Gait Details: no instability or LOB, no need for UE supports or an AD  Stairs            Wheelchair Mobility    Modified Rankin (Stroke Patients Only) Modified Rankin (Stroke Patients  Only) Pre-Morbid Rankin Score: No symptoms Modified Rankin: No symptoms     Balance Overall balance assessment: Needs assistance Sitting-balance support: Feet supported Sitting balance-Leahy Scale: Normal     Standing balance support: No upper extremity supported Standing balance-Leahy Scale: Good               High level balance activites: Side stepping;Backward walking;Direction changes;Turns;Head turns High Level Balance Comments: supervision for safety, no LOB Standardized Balance Assessment Standardized Balance Assessment : Dynamic Gait Index   Dynamic Gait Index Level Surface: Normal Change in Gait Speed: Normal Gait with Horizontal Head Turns: Normal Gait with Vertical Head Turns: Normal Gait and Pivot Turn: Normal Step Over Obstacle: Normal Step Around Obstacles: Normal Steps: Normal Total Score: 24       Pertinent Vitals/Pain Pain Assessment: No/denies pain    Home Living Family/patient expects to be discharged to:: Private residence Living Arrangements: Alone Available Help at Discharge: Friend(s);Neighbor;Available PRN/intermittently Type of Home: Apartment Home Access: Level entry     Home Layout: One level Home Equipment: None      Prior Function Level of Independence: Independent         Comments: drives, on disability     Hand Dominance        Extremity/Trunk Assessment   Upper Extremity Assessment Upper Extremity Assessment: Overall WFL for tasks assessed    Lower Extremity Assessment Lower Extremity Assessment: Overall WFL for tasks assessed    Cervical / Trunk Assessment Cervical / Trunk Assessment: Normal  Communication   Communication:  No difficulties  Cognition Arousal/Alertness: Awake/alert Behavior During Therapy: WFL for tasks assessed/performed Overall Cognitive Status: Within Functional Limits for tasks assessed                                        General Comments      Exercises      Assessment/Plan    PT Assessment Patent does not need any further PT services  PT Problem List         PT Treatment Interventions      PT Goals (Current goals can be found in the Care Plan section)  Acute Rehab PT Goals Patient Stated Goal: return home PT Goal Formulation: All assessment and education complete, DC therapy    Frequency     Barriers to discharge        Co-evaluation               AM-PAC PT "6 Clicks" Mobility  Outcome Measure Help needed turning from your back to your side while in a flat bed without using bedrails?: None Help needed moving from lying on your back to sitting on the side of a flat bed without using bedrails?: None Help needed moving to and from a bed to a chair (including a wheelchair)?: None Help needed standing up from a chair using your arms (e.g., wheelchair or bedside chair)?: None Help needed to walk in hospital room?: None Help needed climbing 3-5 steps with a railing? : None 6 Click Score: 24    End of Session   Activity Tolerance: Patient tolerated treatment well Patient left: in chair;with call bell/phone within reach Nurse Communication: Mobility status PT Visit Diagnosis: Other symptoms and signs involving the nervous system (R29.898)    Time: 1515-1530 PT Time Calculation (min) (ACUTE ONLY): 15 min   Charges:   PT Evaluation $PT Eval Low Complexity: [place], PT, DPT  Acute Rehabilitation Services Pager [phone] Office [place] 03/10/2019, 3:45 PM

## 2019-03-10 NOTE — ED Provider Notes (Signed)
[facility] EMERGENCY DEPARTMENT Provider Note   CSN: [id number] Arrival date & time: 03/09/19  2348    History   Chief Complaint Chief Complaint  Patient presents with   Headache    HPI Cody Shepard is a 53 y.o. male.     Level 5 caveat for acuity of condition.  Patient brought in by EMS with "feeling bad".  The started around 2 PM.  Patient states he woke up around 10 AM and was normal.  He noticed a gradual onset right-sided headache and pressure started about 2:00.  It was associated with blurred vision and dizziness as well as generalized weakness worse in the left side.  Patient believes his weakness started around 2 PM and he woke up normal about 10 AM.  He denies thunderclap onset.  He denies any photophobia or phonophobia.  Denies any fevers, chills, nausea or vomiting.  Complains of blurry vision.  No chest pain or shortness of breath.  Feels weak in his left arm and left leg and weak all over.  EMS was called and brought him to the ED about midnight tonight.  Patient reports no history of migraines in the past.  He is never had this kind of headache before.  He was in the ED 1 year ago for migraine headache but this is different.  The history is provided by the patient and the EMS personnel.  Headache  Associated symptoms: dizziness, numbness and weakness   Associated symptoms: no abdominal pain, no cough, no fever, no myalgias, no nausea, no photophobia and no vomiting     Past Medical History:  Diagnosis Date   Asthma    Bronchitis    COPD (chronic obstructive pulmonary disease) (HCC)    Coronary artery disease    Diabetes mellitus without complication ([place])    Headache    High cholesterol    Hypertension     Patient Active Problem List   Diagnosis Date Noted   Adrenal adenoma, left 10/02/2018   Hyperaldosteronism ([place]) 08/30/2018   Severe hypertension 08/08/2018   Diabetes mellitus without complication ([place]) 65/68/1275   Hypokalemia 07/19/2018    Essential hypertension, benign 07/19/2018    Past Surgical History:  Procedure Laterality Date   LAPAROSCOPIC ADRENALECTOMY Left 01/04/2019   Procedure: LAPAROSCOPIC LEFT ADRENALECTOMY;  Surgeon: Armandina Gemma, MD;  Location: [facility] ORS;  Service: General;  Laterality: Left;   NO PAST SURGERIES          Home Medications    Prior to Admission medications   Medication Sig Start Date End Date Taking? Authorizing Provider  albuterol (PROVENTIL HFA;VENTOLIN HFA) 108 (90 BASE) MCG/ACT inhaler Inhale 2 puffs into the lungs every 4 (four) hours as needed for wheezing or shortness of breath.     [provider]  Ascorbic Acid (VITAMIN C) 1000 MG tablet Take 1,000 mg by mouth daily.    [provider]  aspirin EC 81 MG tablet Take 81 mg by mouth daily.    [provider]  atorvastatin (LIPITOR) 20 MG tablet Take 20 mg by mouth at bedtime.     [provider]  carvedilol (COREG) 25 MG tablet Take 25 mg by mouth 2 (two) times daily with a meal.    [provider]  cloNIDine (CATAPRES - DOSED IN MG/24 HR) 0.3 mg/24hr patch Place 0.3 mg onto the skin every Sunday.     [provider]  colchicine 0.6 MG tablet Take 0.6 mg by mouth 2 (two) times daily.  [provider]  fexofenadine (ALLEGRA) 180 MG tablet Take 180 mg by mouth daily.    [provider]  ipratropium-albuterol (DUONEB) 0.5-2.5 (3) MG/3ML SOLN Take 3 mLs by nebulization 3 (three) times daily.    [provider]  isosorbide mononitrate (IMDUR) 60 MG 24 hr tablet Take 60 mg by mouth 2 (two) times daily.     [provider]  metFORMIN (GLUCOPHAGE) 1000 MG tablet Take 1,000 mg by mouth 2 (two) times daily.    [provider]  montelukast (SINGULAIR) 10 MG tablet Take 10 mg by mouth daily.    [provider]  naproxen sodium (ALEVE) 220 MG tablet Take 440 mg by mouth 2 (two) times daily as needed (pain).    [provider]    olmesartan (BENICAR) 40 MG tablet Take 40 mg by mouth daily.    [provider]  omeprazole (PRILOSEC) 20 MG capsule Take 20 mg by mouth 2 (two) times daily before a meal.     [provider]  oxymetazoline (AFRIN) 0.05 % nasal spray Place 1 spray into both nostrils 2 (two) times daily as needed for congestion.    [provider]  potassium chloride (KLOR-CON) 20 MEQ packet Take 20 mEq by mouth once.    [provider]  ranolazine (RANEXA) 500 MG 12 hr tablet Take 500 mg by mouth 2 (two) times daily.    [provider]  sucralfate (CARAFATE) 1 g tablet Take 1 g by mouth 4 (four) times daily.    [provider]  traMADol (ULTRAM) 50 MG tablet Take 1-2 tablets (50-100 mg total) by mouth every 6 (six) hours as needed. 01/06/19   Armandina Gemma, MD    Family History Family History  Problem Relation Age of Onset   Diabetes Mother    Hypertension Mother    Stroke Father     Social History Social History   Tobacco Use   Smoking status: Former Smoker    Packs/day: 1.00    Types: Cigarettes    Last attempt to quit: 08/30/1997    Years since quitting: 21.5   Smokeless tobacco: Never Used  Substance Use Topics   Alcohol use: No   Drug use: No     Allergies   Patient has no known allergies.   Review of Systems Review of Systems  Constitutional: Negative for activity change, appetite change and fever.  Eyes: Positive for visual disturbance. Negative for photophobia.  Respiratory: Negative for cough, chest tightness and shortness of breath.   Cardiovascular: Negative for chest pain.  Gastrointestinal: Negative for abdominal pain, nausea and vomiting.  Genitourinary: Negative for dysuria and hematuria.  Musculoskeletal: Negative for arthralgias and myalgias.  Skin: Negative for rash.  Neurological: Positive for dizziness, weakness, numbness and headaches.    all other systems are negative except as noted in the HPI and PMH.     Physical Exam Updated Vital Signs BP (!) 125/92 (BP Location: Right Arm)    Pulse 72    Temp 97.8 F (36.6 C) (Oral)    Resp 15    SpO2 100%   Physical Exam Vitals signs and nursing note reviewed.  Constitutional:      General: He is not in acute distress.    Appearance: He is well-developed.     Comments: Alert and oriented x3.  HENT:     Head: Normocephalic and atraumatic.     Mouth/Throat:     Pharynx: No oropharyngeal exudate.  Eyes:  Conjunctiva/sclera: Conjunctivae normal.     Pupils: Pupils are equal, round, and reactive to light.  Neck:     Musculoskeletal: Normal range of motion and neck supple.     Comments: No meningismus. Cardiovascular:     Rate and Rhythm: Normal rate and regular rhythm.     Heart sounds: Normal heart sounds. No murmur.  Pulmonary:     Effort: Pulmonary effort is normal. No respiratory distress.     Breath sounds: Normal breath sounds.  Abdominal:     Palpations: Abdomen is soft.     Tenderness: There is no abdominal tenderness. There is no guarding or rebound.  Musculoskeletal: Normal range of motion.        General: No tenderness.  Skin:    General: Skin is warm.     Capillary Refill: Capillary refill takes less than 2 seconds.  Neurological:     Mental Status: He is alert and oriented to person, place, and time.     Cranial Nerves: No cranial nerve deficit.     Motor: No abnormal muscle tone.     Coordination: Coordination normal.     Comments: No ataxia on finger to nose bilaterally. No pronator drift. 4/5 strength throughout with questionable effort.  Decreased grip strength on the left.  CN 2-12 intact.able to raise each leg off the bed with questionable effort. Visual fields full to confrontation.  Psychiatric:        Behavior: Behavior normal.      ED Treatments / Results  Labs (all labs ordered are listed, but only abnormal results are displayed) Labs Reviewed  CBC WITH DIFFERENTIAL/PLATELET - Abnormal; Notable for  the following components:      Result Value   RBC 3.36 (*)    Hemoglobin 10.4 (*)    HCT 31.1 (*)    Neutro Abs 1.6 (*)    All other components within normal limits  BASIC METABOLIC PANEL - Abnormal; Notable for the following components:   Sodium 133 (*)    Glucose, Bld 106 (*)    Creatinine, Ser 1.82 (*)    Calcium 8.8 (*)    GFR calc non Af Amer 41 (*)    GFR calc Af Amer 47 (*)    All other components within normal limits  I-STAT CREATININE, ED - Abnormal; Notable for the following components:   Creatinine, Ser 1.60 (*)    All other components within normal limits  SARS CORONAVIRUS 2 (HOSPITAL ORDER, [facility])  TROPONIN I  ETHANOL  PROTIME-INR  APTT  RAPID URINE DRUG SCREEN, HOSP PERFORMED  URINALYSIS, ROUTINE W REFLEX MICROSCOPIC    EKG EKG Interpretation  Date/Time:  Friday Mar 09 2019 23:59:17 EDT Ventricular Rate:  74 PR Interval:    QRS Duration: 89 QT Interval:  385 QTC Calculation: 428 R Axis:   33 Text Interpretation:  Sinus rhythm Consider left atrial enlargement Borderline T wave abnormalities No significant change was found Confirmed by Ezequiel Essex [phone]) on 03/10/2019 12:06:10 AM   Radiology Ct Angio Head W Or Wo Contrast  Result Date: 03/10/2019 CLINICAL DATA:  Code stroke. Dizziness and headache. Blurry vision. EXAM: CT HEAD WITHOUT CONTRAST CT ANGIOGRAPHY OF THE HEAD AND NECK TECHNIQUE: Contiguous axial images were obtained from the base of the skull through the vertex without intravenous contrast. Multidetector CT imaging of the head and neck was performed using the standard protocol during bolus administration of intravenous contrast. Multiplanar CT image reconstructions and MIPs were obtained to  evaluate the vascular anatomy. Carotid stenosis measurements (when applicable) are obtained utilizing NASCET criteria, using the distal internal carotid diameter as the denominator. CONTRAST:  11mL OMNIPAQUE IOHEXOL 350 MG/ML  SOLN COMPARISON:  Head CT 01/05/2013 FINDINGS: CT HEAD FINDINGS Brain: There is no mass, hemorrhage or extra-axial collection. The size and configuration of the ventricles and extra-axial CSF spaces are normal. 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 No evidence of acute cortical infarct. Vascular: No abnormal hyperdensity of the major intracranial arteries or dural venous sinuses. No intracranial atherosclerosis. Skull: The visualized skull base, calvarium and extracranial soft tissues are normal. Sinuses/Orbits: No fluid levels or advanced mucosal thickening of the visualized paranasal sinuses. No mastoid or middle ear effusion. The orbits are normal. ASPECTS ([place] Stroke Program Early CT Score) - Ganglionic level infarction (caudate, lentiform nuclei, internal capsule, insula, M1-M3 cortex): 7 - Supraganglionic infarction (M4-M6 cortex): 3 Total score (0-10 with 10 being normal): 10 CTA NECK FINDINGS SKELETON: There is no bony spinal canal stenosis. No lytic or blastic lesion. OTHER NECK: Normal pharynx, larynx and major salivary glands. No cervical lymphadenopathy. Unremarkable thyroid gland. UPPER CHEST: No pneumothorax or pleural effusion. No nodules or masses. AORTIC ARCH: There is no calcific atherosclerosis of the aortic arch. There is no aneurysm, dissection or hemodynamically significant stenosis of the visualized ascending aorta and aortic arch. Conventional 3 vessel aortic branching pattern. The visualized proximal subclavian arteries are widely patent. RIGHT CAROTID SYSTEM: --Common carotid artery: Widely patent origin without common carotid artery dissection or aneurysm. --Internal carotid artery: Normal without aneurysm, dissection or stenosis. --External carotid artery: No acute abnormality. LEFT CAROTID SYSTEM: --Common carotid artery: Widely patent origin without common carotid artery dissection or aneurysm. --Internal carotid artery: Normal without  aneurysm, dissection or stenosis. --External carotid artery: No acute abnormality. VERTEBRAL ARTERIES: Right dominant configuration. Both origins are normal. No dissection, occlusion or flow-limiting stenosis to the vertebrobasilar confluence. CTA HEAD FINDINGS POSTERIOR CIRCULATION: --Vertebral arteries: Normal codominant configuration of V4 segments. --Posterior inferior cerebellar arteries (PICA): Patent origins from the vertebral arteries. --Anterior inferior cerebellar arteries (AICA): Patent origins from the basilar artery. --Basilar artery: Normal. --Superior cerebellar arteries: Normal. --Posterior cerebral arteries (PCA): Normal. Both are predominantly supplied by the posterior communicating arteries (p-comm). ANTERIOR CIRCULATION: --Intracranial internal carotid arteries: Normal. --Anterior cerebral arteries (ACA): Normal. Both A1 segments are present. Patent anterior communicating artery (a-comm). --Middle cerebral arteries (MCA): Normal. VENOUS SINUSES: As permitted by contrast timing, patent. ANATOMIC VARIANTS: None Review of the MIP images confirms the above findings. IMPRESSION: 1. No acute hemorrhage.  Is 10. 2. No emergent large vessel occlusion or high-grade stenosis. These results were called by telephone at the time of interpretation on 03/10/2019 at 1:03 am to Dr. Ezequiel Essex , who verbally acknowledged these results. Electronically Signed   By: Ulyses Jarred M.D.   On: 03/10/2019 01:14   Ct Angio Neck W Or Wo Contrast  Result Date: 03/10/2019 CLINICAL DATA:  Code stroke. Dizziness and headache. Blurry vision. EXAM: CT HEAD WITHOUT CONTRAST CT ANGIOGRAPHY OF THE HEAD AND NECK TECHNIQUE: Contiguous axial images were obtained from the base of the skull through the vertex without intravenous contrast. Multidetector CT imaging of the head and neck was performed using the standard protocol during bolus administration of intravenous contrast. Multiplanar CT image reconstructions and MIPs were  obtained to evaluate the vascular anatomy. Carotid stenosis measurements (when applicable) are obtained utilizing NASCET criteria, using the distal internal carotid diameter as the denominator.  CONTRAST:  170mL OMNIPAQUE IOHEXOL 350 MG/ML SOLN COMPARISON:  Head CT 01/05/2013 FINDINGS: CT HEAD FINDINGS Brain: There is no mass, hemorrhage or extra-axial collection. The size and configuration of the ventricles and extra-axial CSF spaces are normal. 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 No evidence of acute cortical infarct. Vascular: No abnormal hyperdensity of the major intracranial arteries or dural venous sinuses. No intracranial atherosclerosis. Skull: The visualized skull base, calvarium and extracranial soft tissues are normal. Sinuses/Orbits: No fluid levels or advanced mucosal thickening of the visualized paranasal sinuses. No mastoid or middle ear effusion. The orbits are normal. ASPECTS ([place] Stroke Program Early CT Score) - Ganglionic level infarction (caudate, lentiform nuclei, internal capsule, insula, M1-M3 cortex): 7 - Supraganglionic infarction (M4-M6 cortex): 3 Total score (0-10 with 10 being normal): 10 CTA NECK FINDINGS SKELETON: There is no bony spinal canal stenosis. No lytic or blastic lesion. OTHER NECK: Normal pharynx, larynx and major salivary glands. No cervical lymphadenopathy. Unremarkable thyroid gland. UPPER CHEST: No pneumothorax or pleural effusion. No nodules or masses. AORTIC ARCH: There is no calcific atherosclerosis of the aortic arch. There is no aneurysm, dissection or hemodynamically significant stenosis of the visualized ascending aorta and aortic arch. Conventional 3 vessel aortic branching pattern. The visualized proximal subclavian arteries are widely patent. RIGHT CAROTID SYSTEM: --Common carotid artery: Widely patent origin without common carotid artery dissection or aneurysm. --Internal carotid artery: Normal without aneurysm,  dissection or stenosis. --External carotid artery: No acute abnormality. LEFT CAROTID SYSTEM: --Common carotid artery: Widely patent origin without common carotid artery dissection or aneurysm. --Internal carotid artery: Normal without aneurysm, dissection or stenosis. --External carotid artery: No acute abnormality. VERTEBRAL ARTERIES: Right dominant configuration. Both origins are normal. No dissection, occlusion or flow-limiting stenosis to the vertebrobasilar confluence. CTA HEAD FINDINGS POSTERIOR CIRCULATION: --Vertebral arteries: Normal codominant configuration of V4 segments. --Posterior inferior cerebellar arteries (PICA): Patent origins from the vertebral arteries. --Anterior inferior cerebellar arteries (AICA): Patent origins from the basilar artery. --Basilar artery: Normal. --Superior cerebellar arteries: Normal. --Posterior cerebral arteries (PCA): Normal. Both are predominantly supplied by the posterior communicating arteries (p-comm). ANTERIOR CIRCULATION: --Intracranial internal carotid arteries: Normal. --Anterior cerebral arteries (ACA): Normal. Both A1 segments are present. Patent anterior communicating artery (a-comm). --Middle cerebral arteries (MCA): Normal. VENOUS SINUSES: As permitted by contrast timing, patent. ANATOMIC VARIANTS: None Review of the MIP images confirms the above findings. IMPRESSION: 1. No acute hemorrhage.  Is 10. 2. No emergent large vessel occlusion or high-grade stenosis. These results were called by telephone at the time of interpretation on 03/10/2019 at 1:03 am to Dr. Ezequiel Essex , who verbally acknowledged these results. Electronically Signed   By: Ulyses Jarred M.D.   On: 03/10/2019 01:14   Ct Head Code Stroke Wo Contrast  Result Date: 03/10/2019 CLINICAL DATA:  Code stroke. Dizziness and headache. Blurry vision. EXAM: CT HEAD WITHOUT CONTRAST CT ANGIOGRAPHY OF THE HEAD AND NECK TECHNIQUE: Contiguous axial images were obtained from the base of the skull  through the vertex without intravenous contrast. Multidetector CT imaging of the head and neck was performed using the standard protocol during bolus administration of intravenous contrast. Multiplanar CT image reconstructions and MIPs were obtained to evaluate the vascular anatomy. Carotid stenosis measurements (when applicable) are obtained utilizing NASCET criteria, using the distal internal carotid diameter as the denominator. CONTRAST:  135mL OMNIPAQUE IOHEXOL 350 MG/ML SOLN COMPARISON:  Head CT 01/05/2013 FINDINGS: CT HEAD FINDINGS Brain: There is no mass, hemorrhage or extra-axial  collection. The size and configuration of the ventricles and extra-axial CSF spaces are normal. 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 No evidence of acute cortical infarct. Vascular: No abnormal hyperdensity of the major intracranial arteries or dural venous sinuses. No intracranial atherosclerosis. Skull: The visualized skull base, calvarium and extracranial soft tissues are normal. Sinuses/Orbits: No fluid levels or advanced mucosal thickening of the visualized paranasal sinuses. No mastoid or middle ear effusion. The orbits are normal. ASPECTS ([place] Stroke Program Early CT Score) - Ganglionic level infarction (caudate, lentiform nuclei, internal capsule, insula, M1-M3 cortex): 7 - Supraganglionic infarction (M4-M6 cortex): 3 Total score (0-10 with 10 being normal): 10 CTA NECK FINDINGS SKELETON: There is no bony spinal canal stenosis. No lytic or blastic lesion. OTHER NECK: Normal pharynx, larynx and major salivary glands. No cervical lymphadenopathy. Unremarkable thyroid gland. UPPER CHEST: No pneumothorax or pleural effusion. No nodules or masses. AORTIC ARCH: There is no calcific atherosclerosis of the aortic arch. There is no aneurysm, dissection or hemodynamically significant stenosis of the visualized ascending aorta and aortic arch. Conventional 3 vessel aortic branching  pattern. The visualized proximal subclavian arteries are widely patent. RIGHT CAROTID SYSTEM: --Common carotid artery: Widely patent origin without common carotid artery dissection or aneurysm. --Internal carotid artery: Normal without aneurysm, dissection or stenosis. --External carotid artery: No acute abnormality. LEFT CAROTID SYSTEM: --Common carotid artery: Widely patent origin without common carotid artery dissection or aneurysm. --Internal carotid artery: Normal without aneurysm, dissection or stenosis. --External carotid artery: No acute abnormality. VERTEBRAL ARTERIES: Right dominant configuration. Both origins are normal. No dissection, occlusion or flow-limiting stenosis to the vertebrobasilar confluence. CTA HEAD FINDINGS POSTERIOR CIRCULATION: --Vertebral arteries: Normal codominant configuration of V4 segments. --Posterior inferior cerebellar arteries (PICA): Patent origins from the vertebral arteries. --Anterior inferior cerebellar arteries (AICA): Patent origins from the basilar artery. --Basilar artery: Normal. --Superior cerebellar arteries: Normal. --Posterior cerebral arteries (PCA): Normal. Both are predominantly supplied by the posterior communicating arteries (p-comm). ANTERIOR CIRCULATION: --Intracranial internal carotid arteries: Normal. --Anterior cerebral arteries (ACA): Normal. Both A1 segments are present. Patent anterior communicating artery (a-comm). --Middle cerebral arteries (MCA): Normal. VENOUS SINUSES: As permitted by contrast timing, patent. ANATOMIC VARIANTS: None Review of the MIP images confirms the above findings. IMPRESSION: 1. No acute hemorrhage.  Is 10. 2. No emergent large vessel occlusion or high-grade stenosis. These results were called by telephone at the time of interpretation on 03/10/2019 at 1:03 am to Dr. Ezequiel Essex , who verbally acknowledged these results. Electronically Signed   By: Ulyses Jarred M.D.   On: 03/10/2019 01:14    Procedures Procedures  (including critical care time)  Medications Ordered in ED Medications - No data to display   Initial Impression / Assessment and Plan / ED Course  I have reviewed the triage vital signs and the nursing notes.  Pertinent labs & imaging results that were available during my care of the patient were reviewed by me and considered in my medical decision making (see chart for details).       Patient with gradual onset headache associated with left-sided weakness.  Last seen normal was approximately 10 AM.  Code stroke was activated on initial evaluation.  He does have some weakness in his left side but there is questionable effort.  Patient not TPA candidate due to delay in presentation.  CT be obtained to rule out large vessel occlusion.  CT head is negative.  There is no large vessel occlusion.  Discussed with  tele-neurology Dr. Mike Craze who recommends treating for complicated migraine but also recommends MRI.  Patient given Toradol, Reglan and Benadryl.  He is agreeable to observation admission for MRI in the morning. Reports his headache is improving.  Still with some weakness in the left grip strength but NIH score remains 0.  Patient agreeable to observation admission for MRI tomorrow.  Discussed with Dr. Olevia Bowens.  Will need to go to [facility] as MRI not available at this facility on the weekend.  CRITICAL CARE Performed by: Ezequiel Essex Total critical care time: 35 minutes Critical care time was exclusive of separately billable procedures and treating other patients. Critical care was necessary to treat or prevent imminent or life-threatening deterioration. Critical care was time spent personally by me on the following activities: development of treatment plan with patient and/or surrogate as well as nursing, discussions with consultants, evaluation of patient's response to treatment, examination of patient, obtaining history from patient or surrogate, ordering and performing treatments and  interventions, ordering and review of laboratory studies, ordering and review of radiographic studies, pulse oximetry and re-evaluation of patient's condition.    Final Clinical Impressions(s) / ED Diagnoses   Final diagnoses:  Bad headache  Left-sided weakness    ED Discharge Orders    None       Kalim Kissel, Annie Main, MD 03/10/19 [phone]

## 2019-03-10 NOTE — ED Notes (Signed)
Report given to [facility] 

## 2019-03-11 ENCOUNTER — Observation Stay (HOSPITAL_COMMUNITY): Payer: Medicare Other

## 2019-03-11 DIAGNOSIS — E119 Type 2 diabetes mellitus without complications: Secondary | ICD-10-CM

## 2019-03-11 DIAGNOSIS — E785 Hyperlipidemia, unspecified: Secondary | ICD-10-CM | POA: Diagnosis not present

## 2019-03-11 DIAGNOSIS — R51 Headache: Secondary | ICD-10-CM | POA: Diagnosis not present

## 2019-03-11 DIAGNOSIS — I10 Essential (primary) hypertension: Secondary | ICD-10-CM | POA: Diagnosis not present

## 2019-03-11 DIAGNOSIS — J42 Unspecified chronic bronchitis: Secondary | ICD-10-CM | POA: Diagnosis not present

## 2019-03-11 DIAGNOSIS — H538 Other visual disturbances: Secondary | ICD-10-CM | POA: Diagnosis not present

## 2019-03-11 LAB — HIV ANTIBODY (ROUTINE TESTING W REFLEX): HIV Screen 4th Generation wRfx: NONREACTIVE

## 2019-03-11 MED ORDER — DIPHENHYDRAMINE HCL 50 MG/ML IJ SOLN
50.0000 mg | Freq: Every evening | INTRAMUSCULAR | Status: DC | PRN
  Filled 2019-03-11: qty 1

## 2019-03-11 MED ORDER — DIPHENHYDRAMINE HCL 50 MG/ML IJ SOLN
50.0000 mg | Freq: Once | INTRAMUSCULAR | Status: AC
  Administered 2019-03-11: 50 mg via INTRAVENOUS

## 2019-03-11 MED ORDER — IBUPROFEN 200 MG PO TABS
400.0000 mg | ORAL_TABLET | Freq: Once | ORAL | Status: AC
  Administered 2019-03-11: 400 mg via ORAL
  Filled 2019-03-11: qty 2

## 2019-03-11 MED ORDER — ASPIRIN 81 MG PO TBEC: 81.0000 mg | DELAYED_RELEASE_TABLET | Freq: Every day | ORAL | 0 refills | Status: AC

## 2019-03-11 MED ORDER — MAGNESIUM CITRATE PO SOLN
1.0000 | Freq: Once | ORAL | Status: AC
  Administered 2019-03-11: 1 via ORAL
  Filled 2019-03-11: qty 296

## 2019-03-11 MED ORDER — ASPIRIN EC 81 MG PO TBEC
81.0000 mg | DELAYED_RELEASE_TABLET | Freq: Every day | ORAL | Status: DC
  Administered 2019-03-11: 81 mg via ORAL
  Filled 2019-03-11: qty 1

## 2019-03-11 MED ORDER — MAGNESIUM CITRATE PO SOLN: 1.0000 | Freq: Once | ORAL | 1 refills | Status: AC

## 2019-03-11 NOTE — Progress Notes (Signed)
[facility]  PROGRESS NOTE    Teyton Pattillo  UQJ:[id number] DOB: 05/21/1966 DOA: 03/09/2019 PCP: Abran Richard, MD   Brief Narrative:   Morrison Mcbryar is a 53 y.o. male with medical history significant of asthma, bronchitis, COPD, CAD, type 2 diabetes, headache, stage III CKD, hypertension, hyperlipidemia who is coming to the emergency department due to progressively worse headache since early afternoon associated with blurred vision and dizziness.  He had a similar episode about a year or more ago.  He denies visual scotomas, photophobia or phonophobia.  No fever, chills, sore throat, rhinorrhea, wheezing, hemoptysis, chest pain, palpitations, dyspnea, diaphoresis, PND, orthopnea or pitting edema of the lower extremities.  He denies abdominal pain, vomiting, diarrhea, constipation, melena or hematochezia.  No dysuria, frequency or hematuria.  No polyuria, polydipsia, polyphagia or blurred vision.  Denies any skin rashes or pruritus.   Assessment & Plan:   Principal Problem:   Headache Active Problems:   Essential hypertension, benign   COPD (chronic obstructive pulmonary disease) (HCC)   Hyperlipidemia   Type 2 diabetes mellitus (HCC)   Coronary artery disease   Headache     - TIA vs CVA vs migraine     - had some blurry vision and dizziness     - CTH/MRI Brain: negative for acute stroke     - HA, vision resolved     - Tchol: 189, HDL : 35, LDL: 132     - echo as above     - PT/OT: no PT follow up  Essential HTN     - resume coreg  COPD (chronic obstructive pulmonary disease) (HCC)     - Asymptomatic at this time.     - Supplemental oxygen and bronchodilators as needed.  Hyperlipidemia     - Continue atorvastatin.  Type 2 diabetes mellitus (HCC)     - A1c: 6.3     - Carb modified/heart healthy modified diet.     - Hold metformin due to receiving IV contrast earlier.  Coronary artery disease     - Continue atorvastatin, ASA     - resume coreg as no stroke seen on MRI     -  echo:  1. The left ventricle has normal systolic function, with an ejection fraction of 55-60%. The cavity size was normal. Left ventricular diastolic Doppler parameters are consistent with impaired relaxation due to indeterminate diastolic function. Elevated  left ventricular end-diastolic pressure.  2. The right ventricle has normal systolic function. The cavity was normal. There is no increase in right ventricular wall thickness.  3. Left atrial size was mildly dilated.  4. The mitral valve is degenerative. Mild thickening of the anterior and posterior mitral valve leaflets.  5. The aortic valve is tricuspid. Mild sclerosis of the aortic valve. Aortic valve regurgitation is trivial by color flow Doppler.  6. The interatrial septum was not assessed.  Constipation     - KUB: No acute abdominal abnormality. Large stool burden in the ascending and transverse colon.     - mag citrate   DVT prophylaxis: SCDs Code Status: FULL   Disposition Plan: TBD   Consultants:   Neurology   Subjective: "I feel a little constipated, doc!"  Objective: Vitals:   03/11/19 0414 03/11/19 0837 03/11/19 0842 03/11/19 1225  BP: 129/88 (!) 140/98  135/83  Pulse: 73 71  67  Resp: 18 16  18   Temp: 98.2 F (36.8 C) 98.2 F (36.8 C)  (!) 97.4 F (36.3 C)  TempSrc: Oral  Oral  Oral  SpO2: 99% 100% 97% 100%    Intake/Output Summary (Last 24 hours) at 03/11/2019 1409 Last data filed at 03/11/2019 0840 Gross per 24 hour  Intake 400 ml  Output 1150 ml  Net -750 ml   There were no vitals filed for this visit.  Examination:  General: 54 y.o. male resting in bed in NAD Cardiovascular: RRR, +S1, S2, no m/g/r, equal pulses throughout Respiratory: CTABL, no w/r/r, normal WOB GI: BS+, NDNT, no masses noted, no organomegaly noted MSK: No e/c/c Skin: No rashes, bruises, ulcerations noted Neuro: A&O x 3, no focal deficits Psyc: Appropriate interaction and affect, calm/cooperative.      Data Reviewed:  I have personally reviewed following labs and imaging studies.  CBC: Recent Labs  Lab 03/10/19 0010  WBC 4.8  NEUTROABS 1.6*  HGB 10.4*  HCT 31.1*  MCV 92.6  PLT 093   Basic Metabolic Panel: Recent Labs  Lab 03/10/19 0010 03/10/19 0028  NA 133*  --   K 3.5  --   CL 100  --   CO2 22  --   GLUCOSE 106*  --   BUN 18  --   CREATININE 1.82* 1.60*  CALCIUM 8.8*  --    GFR: CrCl cannot be calculated (Unknown ideal weight.). Liver Function Tests: No results for input(s): AST, ALT, ALKPHOS, BILITOT, PROT, ALBUMIN in the last 168 hours. No results for input(s): LIPASE, AMYLASE in the last 168 hours. No results for input(s): AMMONIA in the last 168 hours. Coagulation Profile: Recent Labs  Lab 03/10/19 0010  INR 1.0   Cardiac Enzymes: Recent Labs  Lab 03/10/19 0010  TROPONINI <0.03   BNP (last 3 results) No results for input(s): PROBNP in the last 8760 hours. HbA1C: Recent Labs    03/10/19 0510  HGBA1C 6.3*   CBG: No results for input(s): GLUCAP in the last 168 hours. Lipid Profile: Recent Labs    03/10/19 0510  CHOL 189  HDL 35*  LDLCALC 132*  TRIG 110  CHOLHDL 5.4   Thyroid Function Tests: No results for input(s): TSH, T4TOTAL, FREET4, T3FREE, THYROIDAB in the last 72 hours. Anemia Panel: No results for input(s): VITAMINB12, FOLATE, FERRITIN, TIBC, IRON, RETICCTPCT in the last 72 hours. Sepsis Labs: No results for input(s): PROCALCITON, LATICACIDVEN in the last 168 hours.  Recent Results (from the past 240 hour(s))  SARS Coronavirus 2 (CEPHEID - Performed in [facility] hospital lab), Hosp Order     Status: None   Collection Time: 03/10/19  1:56 AM  Result Value Ref Range Status   SARS Coronavirus 2 NEGATIVE NEGATIVE Final    Comment: (NOTE) If result is NEGATIVE SARS-CoV-2 target nucleic acids are NOT DETECTED. The SARS-CoV-2 RNA is generally detectable in upper and lower  respiratory specimens during the acute phase of infection. The lowest    concentration of SARS-CoV-2 viral copies this assay can detect is 250  copies / mL. A negative result does not preclude SARS-CoV-2 infection  and should not be used as the sole basis for treatment or other  patient management decisions.  A negative result may occur with  improper specimen collection / handling, submission of specimen other  than nasopharyngeal swab, presence of viral mutation(s) within the  areas targeted by this assay, and inadequate number of viral copies  (<250 copies / mL). A negative result must be combined with clinical  observations, patient history, and epidemiological information. If result is POSITIVE SARS-CoV-2 target nucleic acids are DETECTED. The SARS-CoV-2  RNA is generally detectable in upper and lower  respiratory specimens dur ing the acute phase of infection.  Positive  results are indicative of active infection with SARS-CoV-2.  Clinical  correlation with patient history and other diagnostic information is  necessary to determine patient infection status.  Positive results do  not rule out bacterial infection or co-infection with other viruses. If result is PRESUMPTIVE POSTIVE SARS-CoV-2 nucleic acids MAY BE PRESENT.   A presumptive positive result was obtained on the submitted specimen  and confirmed on repeat testing.  While 2019 novel coronavirus  (SARS-CoV-2) nucleic acids may be present in the submitted sample  additional confirmatory testing may be necessary for epidemiological  and / or clinical management purposes  to differentiate between  SARS-CoV-2 and other Sarbecovirus currently known to infect humans.  If clinically indicated additional testing with an alternate test  methodology [phone]) is advised. The SARS-CoV-2 RNA is generally  detectable in upper and lower respiratory sp ecimens during the acute  phase of infection. The expected result is Negative. Fact Sheet for Patients:  StrictlyIdeas.no Fact Sheet  for Healthcare Providers: BankingDealers.co.za This test is not yet approved or cleared by the [place] FDA and has been authorized for detection and/or diagnosis of SARS-CoV-2 by FDA under an Emergency Use Authorization (EUA).  This EUA will remain in effect (meaning this test can be used) for the duration of the COVID-19 declaration under Section 564(b)(1) of the Act, 21 U.S.C. section 360bbb-3(b)(1), unless the authorization is terminated or revoked sooner. Performed at [facility], [address], [place], [place] [address]          Radiology Studies: Ct Angio Head W Or Wo Contrast  Result Date: 03/10/2019 CLINICAL DATA:  Code stroke. Dizziness and headache. Blurry vision. EXAM: CT HEAD WITHOUT CONTRAST CT ANGIOGRAPHY OF THE HEAD AND NECK TECHNIQUE: Contiguous axial images were obtained from the base of the skull through the vertex without intravenous contrast. Multidetector CT imaging of the head and neck was performed using the standard protocol during bolus administration of intravenous contrast. Multiplanar CT image reconstructions and MIPs were obtained to evaluate the vascular anatomy. Carotid stenosis measurements (when applicable) are obtained utilizing NASCET criteria, using the distal internal carotid diameter as the denominator. CONTRAST:  145mL OMNIPAQUE IOHEXOL 350 MG/ML SOLN COMPARISON:  Head CT 01/05/2013 FINDINGS: CT HEAD FINDINGS Brain: There is no mass, hemorrhage or extra-axial collection. The size and configuration of the ventricles and extra-axial CSF spaces are normal. 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 No evidence of acute cortical infarct. Vascular: No abnormal hyperdensity of the major intracranial arteries or dural venous sinuses. No intracranial atherosclerosis. Skull: The visualized skull base, calvarium and extracranial soft tissues are normal. Sinuses/Orbits: No fluid levels or advanced  mucosal thickening of the visualized paranasal sinuses. No mastoid or middle ear effusion. The orbits are normal. ASPECTS ([place] Stroke Program Early CT Score) - Ganglionic level infarction (caudate, lentiform nuclei, internal capsule, insula, M1-M3 cortex): 7 - Supraganglionic infarction (M4-M6 cortex): 3 Total score (0-10 with 10 being normal): 10 CTA NECK FINDINGS SKELETON: There is no bony spinal canal stenosis. No lytic or blastic lesion. OTHER NECK: Normal pharynx, larynx and major salivary glands. No cervical lymphadenopathy. Unremarkable thyroid gland. UPPER CHEST: No pneumothorax or pleural effusion. No nodules or masses. AORTIC ARCH: There is no calcific atherosclerosis of the aortic arch. There is no aneurysm, dissection or hemodynamically significant stenosis of the visualized ascending aorta and aortic arch.  Conventional 3 vessel aortic branching pattern. The visualized proximal subclavian arteries are widely patent. RIGHT CAROTID SYSTEM: --Common carotid artery: Widely patent origin without common carotid artery dissection or aneurysm. --Internal carotid artery: Normal without aneurysm, dissection or stenosis. --External carotid artery: No acute abnormality. LEFT CAROTID SYSTEM: --Common carotid artery: Widely patent origin without common carotid artery dissection or aneurysm. --Internal carotid artery: Normal without aneurysm, dissection or stenosis. --External carotid artery: No acute abnormality. VERTEBRAL ARTERIES: Right dominant configuration. Both origins are normal. No dissection, occlusion or flow-limiting stenosis to the vertebrobasilar confluence. CTA HEAD FINDINGS POSTERIOR CIRCULATION: --Vertebral arteries: Normal codominant configuration of V4 segments. --Posterior inferior cerebellar arteries (PICA): Patent origins from the vertebral arteries. --Anterior inferior cerebellar arteries (AICA): Patent origins from the basilar artery. --Basilar artery: Normal. --Superior cerebellar arteries:  Normal. --Posterior cerebral arteries (PCA): Normal. Both are predominantly supplied by the posterior communicating arteries (p-comm). ANTERIOR CIRCULATION: --Intracranial internal carotid arteries: Normal. --Anterior cerebral arteries (ACA): Normal. Both A1 segments are present. Patent anterior communicating artery (a-comm). --Middle cerebral arteries (MCA): Normal. VENOUS SINUSES: As permitted by contrast timing, patent. ANATOMIC VARIANTS: None Review of the MIP images confirms the above findings. IMPRESSION: 1. No acute hemorrhage.  Is 10. 2. No emergent large vessel occlusion or high-grade stenosis. These results were called by telephone at the time of interpretation on 03/10/2019 at 1:03 am to Dr. Ezequiel Essex , who verbally acknowledged these results. Electronically Signed   By: Ulyses Jarred M.D.   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 EXAM: ABDOMEN - 1 VIEW COMPARISON:  CT abdomen and pelvis 09/28/2018. FINDINGS: Bowel gas pattern unremarkable without evidence of obstruction or significant ileus. Large stool burden in the ascending and transverse colon. The LEFT renal calculus identified on the prior CT is not visible on the current x-ray. Surgical clips to the LEFT of midline at the T12 level at the site of the prior adrenalectomy. Mild degenerative changes involving lumbar spine. IMPRESSION: No acute abdominal abnormality. Large stool burden in the ascending and transverse colon. Electronically Signed   By: Evangeline Dakin M.D.   On: 03/11/2019 13:41   Ct Angio Neck W Or Wo Contrast  Result Date: 03/10/2019 CLINICAL DATA:  Code stroke. Dizziness and headache. Blurry vision. EXAM: CT HEAD WITHOUT CONTRAST CT ANGIOGRAPHY OF THE HEAD AND NECK TECHNIQUE: Contiguous axial images were obtained from  the base of the skull through the vertex without intravenous contrast. Multidetector CT imaging of the head and neck was performed using the standard protocol during bolus administration of intravenous contrast. Multiplanar CT image reconstructions and MIPs were obtained to evaluate the vascular anatomy. Carotid stenosis measurements (when applicable) are obtained utilizing NASCET criteria, using the distal internal carotid diameter as the denominator. CONTRAST:  179mL OMNIPAQUE IOHEXOL 350 MG/ML SOLN COMPARISON:  Head CT 01/05/2013 FINDINGS: CT HEAD FINDINGS Brain: There is no mass, hemorrhage or extra-axial collection. The size and configuration of the ventricles and extra-axial CSF spaces are normal. 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 No evidence of acute cortical infarct. Vascular: No abnormal hyperdensity of the major intracranial arteries or dural venous sinuses. No intracranial atherosclerosis. Skull: The visualized skull base, calvarium and extracranial soft tissues are normal. Sinuses/Orbits: No fluid levels or advanced mucosal thickening of the visualized paranasal sinuses.  No mastoid or middle ear effusion. The orbits are normal. ASPECTS ([place] Stroke Program Early CT Score) - Ganglionic level infarction (caudate, lentiform nuclei, internal capsule, insula, M1-M3 cortex): 7 - Supraganglionic infarction (M4-M6 cortex): 3 Total score (0-10 with 10 being normal): 10 CTA NECK FINDINGS SKELETON: There is no bony spinal canal stenosis. No lytic or blastic lesion. OTHER NECK: Normal pharynx, larynx and major salivary glands. No cervical lymphadenopathy. Unremarkable thyroid gland. UPPER CHEST: No pneumothorax or pleural effusion. No nodules or masses. AORTIC ARCH: There is no calcific atherosclerosis of the aortic arch. There is no aneurysm, dissection or hemodynamically significant stenosis of the visualized ascending aorta and aortic arch. Conventional 3 vessel  aortic branching pattern. The visualized proximal subclavian arteries are widely patent. RIGHT CAROTID SYSTEM: --Common carotid artery: Widely patent origin without common carotid artery dissection or aneurysm. --Internal carotid artery: Normal without aneurysm, dissection or stenosis. --External carotid artery: No acute abnormality. LEFT CAROTID SYSTEM: --Common carotid artery: Widely patent origin without common carotid artery dissection or aneurysm. --Internal carotid artery: Normal without aneurysm, dissection or stenosis. --External carotid artery: No acute abnormality. VERTEBRAL ARTERIES: Right dominant configuration. Both origins are normal. No dissection, occlusion or flow-limiting stenosis to the vertebrobasilar confluence. CTA HEAD FINDINGS POSTERIOR CIRCULATION: --Vertebral arteries: Normal codominant configuration of V4 segments. --Posterior inferior cerebellar arteries (PICA): Patent origins from the vertebral arteries. --Anterior inferior cerebellar arteries (AICA): Patent origins from the basilar artery. --Basilar artery: Normal. --Superior cerebellar arteries: Normal. --Posterior cerebral arteries (PCA): Normal. Both are predominantly supplied by the posterior communicating arteries (p-comm). ANTERIOR CIRCULATION: --Intracranial internal carotid arteries: Normal. --Anterior cerebral arteries (ACA): Normal. Both A1 segments are present. Patent anterior communicating artery (a-comm). --Middle cerebral arteries (MCA): Normal. VENOUS SINUSES: As permitted by contrast timing, patent. ANATOMIC VARIANTS: None Review of the MIP images confirms the above findings. IMPRESSION: 1. No acute hemorrhage.  Is 10. 2. No emergent large vessel occlusion or high-grade stenosis. These results were called by telephone at the time of interpretation on 03/10/2019 at 1:03 am to Dr. Ezequiel Essex , who verbally acknowledged these results. Electronically Signed   By: Ulyses Jarred M.D.   On: 03/10/2019 01:14   Mr Brain Wo  Contrast  Result Date: 03/10/2019 CLINICAL DATA:  Headache, acute, severe, worst headache of life. EXAM: MRI HEAD WITHOUT CONTRAST TECHNIQUE: Multiplanar, multiecho pulse sequences of the brain and surrounding structures were obtained without intravenous contrast. COMPARISON:  CT head without contrast 03/10/2019. CT head without contrast 01/05/2013 FINDINGS: Brain: No acute infarct, hemorrhage, or mass lesion is present. The ventricles are of normal size. No significant extraaxial fluid collection is present. Moderate diffuse white matter disease is chronic and stable. Vascular: A remote lacunar infarct is present in the right cerebellar white matter. Basal ganglia are intact. Brainstem and cerebellum are otherwise normal. Skull and upper cervical spine: Moderate degenerative changes are present at C2-3 disc space. There is chronic endplate change. Skull base is normal. Craniocervical junction is otherwise normal. Sinuses/Orbits: Chronic circumferential mucosal thickening is present in the right maxillary sinus. A fluid level is present. The paranasal sinuses and mastoid air cells are otherwise clear. Globes and orbits are within normal limits. IMPRESSION: 1. Extensive white matter disease similar to prior studies. This likely reflects the sequela of chronic microvascular ischemia in this patient with stage 3 chronic kidney disease, hypertension, and type 2 diabetes. 2. No acute intracranial abnormality. 3. Advanced degenerative changes at C2-3. Electronically Signed   By: San Morelle M.D.   On: 03/10/2019  14:34   Ct Head Code Stroke Wo Contrast  Result Date: 03/10/2019 CLINICAL DATA:  Code stroke. Dizziness and headache. Blurry vision. EXAM: CT HEAD WITHOUT CONTRAST CT ANGIOGRAPHY OF THE HEAD AND NECK TECHNIQUE: Contiguous axial images were obtained from the base of the skull through the vertex without intravenous contrast. Multidetector CT imaging of the head and neck was performed using the  standard protocol during bolus administration of intravenous contrast. Multiplanar CT image reconstructions and MIPs were obtained to evaluate the vascular anatomy. Carotid stenosis measurements (when applicable) are obtained utilizing NASCET criteria, using the distal internal carotid diameter as the denominator. CONTRAST:  142mL OMNIPAQUE IOHEXOL 350 MG/ML SOLN COMPARISON:  Head CT 01/05/2013 FINDINGS: CT HEAD FINDINGS Brain: There is no mass, hemorrhage or extra-axial collection. The size and configuration of the ventricles and extra-axial CSF spaces are normal. 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 No evidence of acute cortical infarct. Vascular: No abnormal hyperdensity of the major intracranial arteries or dural venous sinuses. No intracranial atherosclerosis. Skull: The visualized skull base, calvarium and extracranial soft tissues are normal. Sinuses/Orbits: No fluid levels or advanced mucosal thickening of the visualized paranasal sinuses. No mastoid or middle ear effusion. The orbits are normal. ASPECTS ([place] Stroke Program Early CT Score) - Ganglionic level infarction (caudate, lentiform nuclei, internal capsule, insula, M1-M3 cortex): 7 - Supraganglionic infarction (M4-M6 cortex): 3 Total score (0-10 with 10 being normal): 10 CTA NECK FINDINGS SKELETON: There is no bony spinal canal stenosis. No lytic or blastic lesion. OTHER NECK: Normal pharynx, larynx and major salivary glands. No cervical lymphadenopathy. Unremarkable thyroid gland. UPPER CHEST: No pneumothorax or pleural effusion. No nodules or masses. AORTIC ARCH: There is no calcific atherosclerosis of the aortic arch. There is no aneurysm, dissection or hemodynamically significant stenosis of the visualized ascending aorta and aortic arch. Conventional 3 vessel aortic branching pattern. The visualized proximal subclavian arteries are widely patent. RIGHT CAROTID SYSTEM: --Common carotid artery: Widely  patent origin without common carotid artery dissection or aneurysm. --Internal carotid artery: Normal without aneurysm, dissection or stenosis. --External carotid artery: No acute abnormality. LEFT CAROTID SYSTEM: --Common carotid artery: Widely patent origin without common carotid artery dissection or aneurysm. --Internal carotid artery: Normal without aneurysm, dissection or stenosis. --External carotid artery: No acute abnormality. VERTEBRAL ARTERIES: Right dominant configuration. Both origins are normal. No dissection, occlusion or flow-limiting stenosis to the vertebrobasilar confluence. CTA HEAD FINDINGS POSTERIOR CIRCULATION: --Vertebral arteries: Normal codominant configuration of V4 segments. --Posterior inferior cerebellar arteries (PICA): Patent origins from the vertebral arteries. --Anterior inferior cerebellar arteries (AICA): Patent origins from the basilar artery. --Basilar artery: Normal. --Superior cerebellar arteries: Normal. --Posterior cerebral arteries (PCA): Normal. Both are predominantly supplied by the posterior communicating arteries (p-comm). ANTERIOR CIRCULATION: --Intracranial internal carotid arteries: Normal. --Anterior cerebral arteries (ACA): Normal. Both A1 segments are present. Patent anterior communicating artery (a-comm). --Middle cerebral arteries (MCA): Normal. VENOUS SINUSES: As permitted by contrast timing, patent. ANATOMIC VARIANTS: None Review of the MIP images confirms the above findings. IMPRESSION: 1. No acute hemorrhage.  Is 10. 2. No emergent large vessel occlusion or high-grade stenosis. These results were called by telephone at the time of interpretation on 03/10/2019 at 1:03 am to Dr. Ezequiel Essex , who verbally acknowledged these results. Electronically Signed   By: Ulyses Jarred M.D.   On: 03/10/2019 01:14        Scheduled Meds:   stroke: mapping our early stages of recovery book   Does not apply  Once   aspirin EC  81 mg Oral Daily   atorvastatin  20  mg Oral QHS   cloNIDine  0.3 mg Transdermal Q Sun   colchicine  0.6 mg Oral BID   diphenhydrAMINE  50 mg Intravenous Once   fluticasone  2 spray Each Nare Daily   umeclidinium bromide  1 puff Inhalation Daily   And   fluticasone furoate-vilanterol  1 puff Inhalation Daily   hydrALAZINE  100 mg Oral TID   ibuprofen  400 mg Oral Once   montelukast  10 mg Oral Daily   ranolazine  500 mg Oral BID   Continuous Infusions:   LOS: 0 days    Time spent: 25 minutes spent in the coordination of care today.    Jonnie Finner, DO [facility] Pager [phone]  If 7PM-7AM, please contact night-coverage [email or web address] Password [email or web address] 03/11/2019, 2:09 PM

## 2019-03-11 NOTE — Plan of Care (Signed)
Adequate for discharge.

## 2019-03-11 NOTE — Discharge Summary (Signed)
. Physician Discharge Summary  Abanoub Hanken GLO:[id number] DOB: 1966-05-22 DOA: 03/09/2019  PCP: Abran Richard, MD  Admit date: 03/09/2019 Discharge date: 03/11/2019  Admitted From: Home Disposition:  Discharge to home.  Recommendations for Outpatient Follow-up:  1. Follow up with PCP in 1-2 weeks 2. Please obtain BMP/CBC in one week   Discharge Condition: Stable  CODE STATUS: FULL   Brief/Interim Summary: Zelma Dillardis a 53 y.o.malewith medical history significant ofasthma, bronchitis, COPD, CAD, type 2 diabetes, headache, stage III CKD, hypertension, hyperlipidemia who is coming to the emergency department due to progressively worse headache since early afternoon associated with blurredvision and dizziness.He had a similar episode about a year or more ago. He denies visual scotomas, photophobia or phonophobia. No fever, chills, sore throat, rhinorrhea, wheezing, hemoptysis, chest pain, palpitations, dyspnea, diaphoresis, PND, orthopnea or pitting edema of the lower extremities. He denies abdominal pain, vomiting, diarrhea, constipation, melena or hematochezia. No dysuria, frequency or hematuria. No polyuria, polydipsia, polyphagia or blurred vision. Denies any skin rashes or pruritus.  Headache     - TIA vs CVA vs migraine     - had some blurry vision and dizziness     - CTH/MRI Brain: negative for acute stroke     - HA, vision resolved     - Tchol: 189, HDL : 35, LDL: 132     - echo as above     - PT/OT: no PT/OT follow up     - complex migraine per neuro  Essential HTN     - resume coreg  COPD (chronic obstructive pulmonary disease) (HCC)     - Asymptomatic at this time.     - Supplemental oxygen and bronchodilators as needed.  Hyperlipidemia     - Continue atorvastatin.  Type 2 diabetes mellitus (HCC)     - A1c: 6.3     - Carb modified/heart healthy modified diet.     - Hold metformin due to receiving IV contrast earlier.  Coronary artery disease      - Continue atorvastatin, ASA     - resume coreg as no stroke seen on MRI     - echo:  1. The left ventricle has normal systolic function, with an ejection fraction of 55-60%. The cavity size was normal. Left ventricular diastolic Doppler parameters are consistent with impaired relaxation due to indeterminate diastolic function. Elevated  left ventricular end-diastolic pressure.  2. The right ventricle has normal systolic function. The cavity was normal. There is no increase in right ventricular wall thickness.  3. Left atrial size was mildly dilated.  4. The mitral valve is degenerative. Mild thickening of the anterior and posterior mitral valve leaflets.  5. The aortic valve is tricuspid. Mild sclerosis of the aortic valve. Aortic valve regurgitation is trivial by color flow Doppler.  6. The interatrial septum was not assessed.  Constipation     - KUB: No acute abdominal abnormality. Large stool burden in the ascending and transverse colon.     - mag citrate  Discharge Diagnoses:  Principal Problem:   Headache Active Problems:   Essential hypertension, benign   COPD (chronic obstructive pulmonary disease) (HCC)   Hyperlipidemia   Type 2 diabetes mellitus (HCC)   Coronary artery disease    Discharge Instructions   Allergies as of 03/11/2019   No Known Allergies     Medication List    TAKE these medications   albuterol 108 (90 Base) MCG/ACT inhaler Commonly known as:  VENTOLIN HFA Inhale 2 puffs  into the lungs every 4 (four) hours as needed for wheezing or shortness of breath.   aspirin 81 MG EC tablet Take 1 tablet (81 mg total) by mouth daily. Start taking on:  Mar 12, 2019   atorvastatin 20 MG tablet Commonly known as:  LIPITOR Take 20 mg by mouth at bedtime.   carvedilol 25 MG tablet Commonly known as:  COREG Take 25 mg by mouth 2 (two) times daily with a meal.   cloNIDine 0.3 mg/24hr patch Commonly known as:  CATAPRES - Dosed in mg/24 hr Place 0.3 mg onto  the skin every Sunday.   fluticasone 50 MCG/ACT nasal spray Commonly known as:  FLONASE Place 2 sprays into both nostrils 2 (two) times a day.   magnesium citrate Soln Take 296 mLs (1 Bottle total) by mouth once for 1 dose.   metFORMIN 1000 MG tablet Commonly known as:  GLUCOPHAGE Take 500 mg by mouth 2 (two) times daily.   olmesartan 20 MG tablet Commonly known as:  BENICAR Take 20 mg by mouth daily.   ranolazine 500 MG 12 hr tablet Commonly known as:  RANEXA Take 500 mg by mouth 2 (two) times daily.   Trelegy Ellipta 100-62.5-25 MCG/INH Aepb Generic drug:  Fluticasone-Umeclidin-Vilant Inhale 1 Inhaler into the lungs daily.   vitamin C 1000 MG tablet Take 1,000 mg by mouth daily.       No Known Allergies  Consultations:  Neurology   Procedures/Studies: Ct Angio Head W Or Wo Contrast  Result Date: 03/10/2019 CLINICAL DATA:  Code stroke. Dizziness and headache. Blurry vision. EXAM: CT HEAD WITHOUT CONTRAST CT ANGIOGRAPHY OF THE HEAD AND NECK TECHNIQUE: Contiguous axial images were obtained from the base of the skull through the vertex without intravenous contrast. Multidetector CT imaging of the head and neck was performed using the standard protocol during bolus administration of intravenous contrast. Multiplanar CT image reconstructions and MIPs were obtained to evaluate the vascular anatomy. Carotid stenosis measurements (when applicable) are obtained utilizing NASCET criteria, using the distal internal carotid diameter as the denominator. CONTRAST:  116mL OMNIPAQUE IOHEXOL 350 MG/ML SOLN COMPARISON:  Head CT 01/05/2013 FINDINGS: CT HEAD FINDINGS Brain: There is no mass, hemorrhage or extra-axial collection. The size and configuration of the ventricles and extra-axial CSF spaces are normal. 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 No evidence of acute cortical infarct. Vascular: No abnormal hyperdensity of the major intracranial  arteries or dural venous sinuses. No intracranial atherosclerosis. Skull: The visualized skull base, calvarium and extracranial soft tissues are normal. Sinuses/Orbits: No fluid levels or advanced mucosal thickening of the visualized paranasal sinuses. No mastoid or middle ear effusion. The orbits are normal. ASPECTS ([place] Stroke Program Early CT Score) - Ganglionic level infarction (caudate, lentiform nuclei, internal capsule, insula, M1-M3 cortex): 7 - Supraganglionic infarction (M4-M6 cortex): 3 Total score (0-10 with 10 being normal): 10 CTA NECK FINDINGS SKELETON: There is no bony spinal canal stenosis. No lytic or blastic lesion. OTHER NECK: Normal pharynx, larynx and major salivary glands. No cervical lymphadenopathy. Unremarkable thyroid gland. UPPER CHEST: No pneumothorax or pleural effusion. No nodules or masses. AORTIC ARCH: There is no calcific atherosclerosis of the aortic arch. There is no aneurysm, dissection or hemodynamically significant stenosis of the visualized ascending aorta and aortic arch. Conventional 3 vessel aortic branching pattern. The visualized proximal subclavian arteries are widely patent. RIGHT CAROTID SYSTEM: --Common carotid artery: Widely patent origin without common carotid artery dissection or aneurysm. --Internal carotid artery:  Normal without aneurysm, dissection or stenosis. --External carotid artery: No acute abnormality. LEFT CAROTID SYSTEM: --Common carotid artery: Widely patent origin without common carotid artery dissection or aneurysm. --Internal carotid artery: Normal without aneurysm, dissection or stenosis. --External carotid artery: No acute abnormality. VERTEBRAL ARTERIES: Right dominant configuration. Both origins are normal. No dissection, occlusion or flow-limiting stenosis to the vertebrobasilar confluence. CTA HEAD FINDINGS POSTERIOR CIRCULATION: --Vertebral arteries: Normal codominant configuration of V4 segments. --Posterior inferior cerebellar arteries  (PICA): Patent origins from the vertebral arteries. --Anterior inferior cerebellar arteries (AICA): Patent origins from the basilar artery. --Basilar artery: Normal. --Superior cerebellar arteries: Normal. --Posterior cerebral arteries (PCA): Normal. Both are predominantly supplied by the posterior communicating arteries (p-comm). ANTERIOR CIRCULATION: --Intracranial internal carotid arteries: Normal. --Anterior cerebral arteries (ACA): Normal. Both A1 segments are present. Patent anterior communicating artery (a-comm). --Middle cerebral arteries (MCA): Normal. VENOUS SINUSES: As permitted by contrast timing, patent. ANATOMIC VARIANTS: None Review of the MIP images confirms the above findings. IMPRESSION: 1. No acute hemorrhage.  Is 10. 2. No emergent large vessel occlusion or high-grade stenosis. These results were called by telephone at the time of interpretation on 03/10/2019 at 1:03 am to Dr. Ezequiel Essex , who verbally acknowledged these results. Electronically Signed   By: Ulyses Jarred M.D.   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 EXAM: ABDOMEN - 1 VIEW COMPARISON:  CT abdomen and pelvis 09/28/2018. FINDINGS: Bowel gas pattern unremarkable without evidence of obstruction or significant ileus. Large stool burden in the ascending and transverse colon. The LEFT renal calculus identified on the prior CT is not visible on the current x-ray. Surgical clips to the LEFT of midline at the T12 level at the site of the prior adrenalectomy. Mild degenerative changes involving lumbar spine. IMPRESSION: No acute abdominal abnormality. Large stool burden in the ascending and transverse colon. Electronically Signed   By: Evangeline Dakin M.D.   On: 03/11/2019 13:41   Ct Angio Neck W Or Wo Contrast  Result Date:  03/10/2019 CLINICAL DATA:  Code stroke. Dizziness and headache. Blurry vision. EXAM: CT HEAD WITHOUT CONTRAST CT ANGIOGRAPHY OF THE HEAD AND NECK TECHNIQUE: Contiguous axial images were obtained from the base of the skull through the vertex without intravenous contrast. Multidetector CT imaging of the head and neck was performed using the standard protocol during bolus administration of intravenous contrast. Multiplanar CT image reconstructions and MIPs were obtained to evaluate the vascular anatomy. Carotid stenosis measurements (when applicable) are obtained utilizing NASCET criteria, using the distal internal carotid diameter as the denominator. CONTRAST:  163mL OMNIPAQUE IOHEXOL 350 MG/ML SOLN COMPARISON:  Head CT 01/05/2013 FINDINGS: CT HEAD FINDINGS Brain: There is no mass, hemorrhage or extra-axial collection. The size and configuration of the ventricles and extra-axial CSF spaces are normal. 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 No evidence of acute cortical infarct. Vascular: No abnormal hyperdensity of the major intracranial arteries or dural venous sinuses. No intracranial atherosclerosis. Skull: The visualized skull base, calvarium and extracranial soft tissues are normal. Sinuses/Orbits: No fluid levels or advanced mucosal thickening of the visualized paranasal sinuses. No mastoid or middle ear effusion. The orbits are normal. ASPECTS [facility] Stroke Program Early CT Score) - Ganglionic level infarction (caudate, lentiform nuclei, internal capsule, insula, M1-M3 cortex): 7 - Supraganglionic infarction (  M4-M6 cortex): 3 Total score (0-10 with 10 being normal): 10 CTA NECK FINDINGS SKELETON: There is no bony spinal canal stenosis. No lytic or blastic lesion. OTHER NECK: Normal pharynx, larynx and major salivary glands. No cervical lymphadenopathy. Unremarkable thyroid gland. UPPER CHEST: No pneumothorax or pleural effusion. No nodules or masses. AORTIC ARCH: There  is no calcific atherosclerosis of the aortic arch. There is no aneurysm, dissection or hemodynamically significant stenosis of the visualized ascending aorta and aortic arch. Conventional 3 vessel aortic branching pattern. The visualized proximal subclavian arteries are widely patent. RIGHT CAROTID SYSTEM: --Common carotid artery: Widely patent origin without common carotid artery dissection or aneurysm. --Internal carotid artery: Normal without aneurysm, dissection or stenosis. --External carotid artery: No acute abnormality. LEFT CAROTID SYSTEM: --Common carotid artery: Widely patent origin without common carotid artery dissection or aneurysm. --Internal carotid artery: Normal without aneurysm, dissection or stenosis. --External carotid artery: No acute abnormality. VERTEBRAL ARTERIES: Right dominant configuration. Both origins are normal. No dissection, occlusion or flow-limiting stenosis to the vertebrobasilar confluence. CTA HEAD FINDINGS POSTERIOR CIRCULATION: --Vertebral arteries: Normal codominant configuration of V4 segments. --Posterior inferior cerebellar arteries (PICA): Patent origins from the vertebral arteries. --Anterior inferior cerebellar arteries (AICA): Patent origins from the basilar artery. --Basilar artery: Normal. --Superior cerebellar arteries: Normal. --Posterior cerebral arteries (PCA): Normal. Both are predominantly supplied by the posterior communicating arteries (p-comm). ANTERIOR CIRCULATION: --Intracranial internal carotid arteries: Normal. --Anterior cerebral arteries (ACA): Normal. Both A1 segments are present. Patent anterior communicating artery (a-comm). --Middle cerebral arteries (MCA): Normal. VENOUS SINUSES: As permitted by contrast timing, patent. ANATOMIC VARIANTS: None Review of the MIP images confirms the above findings. IMPRESSION: 1. No acute hemorrhage.  Is 10. 2. No emergent large vessel occlusion or high-grade stenosis. These results were called by telephone at the  time of interpretation on 03/10/2019 at 1:03 am to Dr. Ezequiel Essex , who verbally acknowledged these results. Electronically Signed   By: Ulyses Jarred M.D.   On: 03/10/2019 01:14   Mr Brain Wo Contrast  Result Date: 03/10/2019 CLINICAL DATA:  Headache, acute, severe, worst headache of life. EXAM: MRI HEAD WITHOUT CONTRAST TECHNIQUE: Multiplanar, multiecho pulse sequences of the brain and surrounding structures were obtained without intravenous contrast. COMPARISON:  CT head without contrast 03/10/2019. CT head without contrast 01/05/2013 FINDINGS: Brain: No acute infarct, hemorrhage, or mass lesion is present. The ventricles are of normal size. No significant extraaxial fluid collection is present. Moderate diffuse white matter disease is chronic and stable. Vascular: A remote lacunar infarct is present in the right cerebellar white matter. Basal ganglia are intact. Brainstem and cerebellum are otherwise normal. Skull and upper cervical spine: Moderate degenerative changes are present at C2-3 disc space. There is chronic endplate change. Skull base is normal. Craniocervical junction is otherwise normal. Sinuses/Orbits: Chronic circumferential mucosal thickening is present in the right maxillary sinus. A fluid level is present. The paranasal sinuses and mastoid air cells are otherwise clear. Globes and orbits are within normal limits. IMPRESSION: 1. Extensive white matter disease similar to prior studies. This likely reflects the sequela of chronic microvascular ischemia in this patient with stage 3 chronic kidney disease, hypertension, and type 2 diabetes. 2. No acute intracranial abnormality. 3. Advanced degenerative changes at C2-3. Electronically Signed   By: San Morelle M.D.   On: 03/10/2019 14:34   Ct Head Code Stroke Wo Contrast  Result Date: 03/10/2019 CLINICAL DATA:  Code stroke. Dizziness and headache. Blurry vision. EXAM: CT HEAD WITHOUT CONTRAST CT ANGIOGRAPHY OF THE HEAD  AND NECK  TECHNIQUE: Contiguous axial images were obtained from the base of the skull through the vertex without intravenous contrast. Multidetector CT imaging of the head and neck was performed using the standard protocol during bolus administration of intravenous contrast. Multiplanar CT image reconstructions and MIPs were obtained to evaluate the vascular anatomy. Carotid stenosis measurements (when applicable) are obtained utilizing NASCET criteria, using the distal internal carotid diameter as the denominator. CONTRAST:  131mL OMNIPAQUE IOHEXOL 350 MG/ML SOLN COMPARISON:  Head CT 01/05/2013 FINDINGS: CT HEAD FINDINGS Brain: There is no mass, hemorrhage or extra-axial collection. The size and configuration of the ventricles and extra-axial CSF spaces are normal. 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 No evidence of acute cortical infarct. Vascular: No abnormal hyperdensity of the major intracranial arteries or dural venous sinuses. No intracranial atherosclerosis. Skull: The visualized skull base, calvarium and extracranial soft tissues are normal. Sinuses/Orbits: No fluid levels or advanced mucosal thickening of the visualized paranasal sinuses. No mastoid or middle ear effusion. The orbits are normal. ASPECTS ([employer] Stroke Program Early CT Score) - Ganglionic level infarction (caudate, lentiform nuclei, internal capsule, insula, M1-M3 cortex): 7 - Supraganglionic infarction (M4-M6 cortex): 3 Total score (0-10 with 10 being normal): 10 CTA NECK FINDINGS SKELETON: There is no bony spinal canal stenosis. No lytic or blastic lesion. OTHER NECK: Normal pharynx, larynx and major salivary glands. No cervical lymphadenopathy. Unremarkable thyroid gland. UPPER CHEST: No pneumothorax or pleural effusion. No nodules or masses. AORTIC ARCH: There is no calcific atherosclerosis of the aortic arch. There is no aneurysm, dissection or hemodynamically significant stenosis of the visualized  ascending aorta and aortic arch. Conventional 3 vessel aortic branching pattern. The visualized proximal subclavian arteries are widely patent. RIGHT CAROTID SYSTEM: --Common carotid artery: Widely patent origin without common carotid artery dissection or aneurysm. --Internal carotid artery: Normal without aneurysm, dissection or stenosis. --External carotid artery: No acute abnormality. LEFT CAROTID SYSTEM: --Common carotid artery: Widely patent origin without common carotid artery dissection or aneurysm. --Internal carotid artery: Normal without aneurysm, dissection or stenosis. --External carotid artery: No acute abnormality. VERTEBRAL ARTERIES: Right dominant configuration. Both origins are normal. No dissection, occlusion or flow-limiting stenosis to the vertebrobasilar confluence. CTA HEAD FINDINGS POSTERIOR CIRCULATION: --Vertebral arteries: Normal codominant configuration of V4 segments. --Posterior inferior cerebellar arteries (PICA): Patent origins from the vertebral arteries. --Anterior inferior cerebellar arteries (AICA): Patent origins from the basilar artery. --Basilar artery: Normal. --Superior cerebellar arteries: Normal. --Posterior cerebral arteries (PCA): Normal. Both are predominantly supplied by the posterior communicating arteries (p-comm). ANTERIOR CIRCULATION: --Intracranial internal carotid arteries: Normal. --Anterior cerebral arteries (ACA): Normal. Both A1 segments are present. Patent anterior communicating artery (a-comm). --Middle cerebral arteries (MCA): Normal. VENOUS SINUSES: As permitted by contrast timing, patent. ANATOMIC VARIANTS: None Review of the MIP images confirms the above findings. IMPRESSION: 1. No acute hemorrhage.  Is 10. 2. No emergent large vessel occlusion or high-grade stenosis. These results were called by telephone at the time of interpretation on 03/10/2019 at 1:03 am to Dr. Ezequiel Essex , who verbally acknowledged these results. Electronically Signed   By:  Ulyses Jarred M.D.   On: 03/10/2019 01:14      Subjective: "I feel a little constipated"  Discharge Exam: Vitals:   03/11/19 0842 03/11/19 1225  BP:  135/83  Pulse:  67  Resp:  18  Temp:  (!) 97.4 F (36.3 C)  SpO2: 97% 100%   Vitals:   03/11/19 0414 03/11/19 0837 03/11/19 9169  03/11/19 1225  BP: 129/88 (!) 140/98  135/83  Pulse: 73 71  67  Resp: 18 16  18   Temp: 98.2 F (36.8 C) 98.2 F (36.8 C)  (!) 97.4 F (36.3 C)  TempSrc: Oral Oral  Oral  SpO2: 99% 100% 97% 100%    General: 53 y.o. male resting in bed in NAD Cardiovascular: RRR, +S1, S2, no m/g/r, equal pulses throughout Respiratory: CTABL, no w/r/r, normal WOB GI: BS+, NDNT, no masses noted, no organomegaly noted MSK: No e/c/c Skin: No rashes, bruises, ulcerations noted Neuro: A&O x 3, no focal deficits Psyc: Appropriate interaction and affect, calm/cooperative.     The results of significant diagnostics from this hospitalization (including imaging, microbiology, ancillary and laboratory) are listed below for reference.     Microbiology: Recent Results (from the past 240 hour(s))  SARS Coronavirus 2 (CEPHEID - Performed in [place] hospital lab), Hosp Order     Status: None   Collection Time: 03/10/19  1:56 AM  Result Value Ref Range Status   SARS Coronavirus 2 NEGATIVE NEGATIVE Final    Comment: (NOTE) If result is NEGATIVE SARS-CoV-2 target nucleic acids are NOT DETECTED. The SARS-CoV-2 RNA is generally detectable in upper and lower  respiratory specimens during the acute phase of infection. The lowest  concentration of SARS-CoV-2 viral copies this assay can detect is 250  copies / mL. A negative result does not preclude SARS-CoV-2 infection  and should not be used as the sole basis for treatment or other  patient management decisions.  A negative result may occur with  improper specimen collection / handling, submission of specimen other  than nasopharyngeal swab, presence of viral mutation(s)  within the  areas targeted by this assay, and inadequate number of viral copies  (<250 copies / mL). A negative result must be combined with clinical  observations, patient history, and epidemiological information. If result is POSITIVE SARS-CoV-2 target nucleic acids are DETECTED. The SARS-CoV-2 RNA is generally detectable in upper and lower  respiratory specimens dur ing the acute phase of infection.  Positive  results are indicative of active infection with SARS-CoV-2.  Clinical  correlation with patient history and other diagnostic information is  necessary to determine patient infection status.  Positive results do  not rule out bacterial infection or co-infection with other viruses. If result is PRESUMPTIVE POSTIVE SARS-CoV-2 nucleic acids MAY BE PRESENT.   A presumptive positive result was obtained on the submitted specimen  and confirmed on repeat testing.  While 2019 novel coronavirus  (SARS-CoV-2) nucleic acids may be present in the submitted sample  additional confirmatory testing may be necessary for epidemiological  and / or clinical management purposes  to differentiate between  SARS-CoV-2 and other Sarbecovirus currently known to infect humans.  If clinically indicated additional testing with an alternate test  methodology [phone]) is advised. The SARS-CoV-2 RNA is generally  detectable in upper and lower respiratory sp ecimens during the acute  phase of infection. The expected result is Negative. Fact Sheet for Patients:  StrictlyIdeas.no Fact Sheet for Healthcare Providers: BankingDealers.co.za This test is not yet approved or cleared by the [place] FDA and has been authorized for detection and/or diagnosis of SARS-CoV-2 by FDA under an Emergency Use Authorization (EUA).  This EUA will remain in effect (meaning this test can be used) for the duration of the COVID-19 declaration under Section 564(b)(1) of the Act,  21 U.S.C. section 360bbb-3(b)(1), unless the authorization is terminated or revoked sooner. Performed at [facility]  [address] [place], [place] [address]      Labs: BNP (last 3 results) No results for input(s): BNP in the last 8760 hours. Basic Metabolic Panel: Recent Labs  Lab 03/10/19 0010 03/10/19 0028  NA 133*  --   K 3.5  --   CL 100  --   CO2 22  --   GLUCOSE 106*  --   BUN 18  --   CREATININE 1.82* 1.60*  CALCIUM 8.8*  --    Liver Function Tests: No results for input(s): AST, ALT, ALKPHOS, BILITOT, PROT, ALBUMIN in the last 168 hours. No results for input(s): LIPASE, AMYLASE in the last 168 hours. No results for input(s): AMMONIA in the last 168 hours. CBC: Recent Labs  Lab 03/10/19 0010  WBC 4.8  NEUTROABS 1.6*  HGB 10.4*  HCT 31.1*  MCV 92.6  PLT 214   Cardiac Enzymes: Recent Labs  Lab 03/10/19 0010  TROPONINI <0.03   BNP: Invalid input(s): POCBNP CBG: No results for input(s): GLUCAP in the last 168 hours. D-Dimer No results for input(s): DDIMER in the last 72 hours. Hgb A1c Recent Labs    03/10/19 0510  HGBA1C 6.3*   Lipid Profile Recent Labs    03/10/19 0510  CHOL 189  HDL 35*  LDLCALC 132*  TRIG 110  CHOLHDL 5.4   Thyroid function studies No results for input(s): TSH, T4TOTAL, T3FREE, THYROIDAB in the last 72 hours.  Invalid input(s): FREET3 Anemia work up No results for input(s): VITAMINB12, FOLATE, FERRITIN, TIBC, IRON, RETICCTPCT in the last 72 hours. Urinalysis    Component Value Date/Time   COLORURINE YELLOW 03/10/2019 0012   APPEARANCEUR CLEAR 03/10/2019 0012   LABSPEC 1.008 03/10/2019 0012   PHURINE 5.0 03/10/2019 0012   GLUCOSEU NEGATIVE 03/10/2019 0012   HGBUR NEGATIVE 03/10/2019 0012   BILIRUBINUR NEGATIVE 03/10/2019 0012   KETONESUR NEGATIVE 03/10/2019 0012   PROTEINUR NEGATIVE 03/10/2019 0012   NITRITE NEGATIVE 03/10/2019 0012   LEUKOCYTESUR NEGATIVE 03/10/2019 0012   Sepsis Labs Invalid input(s):  PROCALCITONIN,  WBC,  LACTICIDVEN Microbiology Recent Results (from the past 240 hour(s))  SARS Coronavirus 2 (CEPHEID - Performed in [facility] lab), Hosp Order     Status: None   Collection Time: 03/10/19  1:56 AM  Result Value Ref Range Status   SARS Coronavirus 2 NEGATIVE NEGATIVE Final    Comment: (NOTE) If result is NEGATIVE SARS-CoV-2 target nucleic acids are NOT DETECTED. The SARS-CoV-2 RNA is generally detectable in upper and lower  respiratory specimens during the acute phase of infection. The lowest  concentration of SARS-CoV-2 viral copies this assay can detect is 250  copies / mL. A negative result does not preclude SARS-CoV-2 infection  and should not be used as the sole basis for treatment or other  patient management decisions.  A negative result may occur with  improper specimen collection / handling, submission of specimen other  than nasopharyngeal swab, presence of viral mutation(s) within the  areas targeted by this assay, and inadequate number of viral copies  (<250 copies / mL). A negative result must be combined with clinical  observations, patient history, and epidemiological information. If result is POSITIVE SARS-CoV-2 target nucleic acids are DETECTED. The SARS-CoV-2 RNA is generally detectable in upper and lower  respiratory specimens dur ing the acute phase of infection.  Positive  results are indicative of active infection with SARS-CoV-2.  Clinical  correlation with patient history and other diagnostic information is  necessary to determine patient  infection status.  Positive results do  not rule out bacterial infection or co-infection with other viruses. If result is PRESUMPTIVE POSTIVE SARS-CoV-2 nucleic acids MAY BE PRESENT.   A presumptive positive result was obtained on the submitted specimen  and confirmed on repeat testing.  While 2019 novel coronavirus  (SARS-CoV-2) nucleic acids may be present in the submitted sample  additional  confirmatory testing may be necessary for epidemiological  and / or clinical management purposes  to differentiate between  SARS-CoV-2 and other Sarbecovirus currently known to infect humans.  If clinically indicated additional testing with an alternate test  methodology [id number]) is advised. The SARS-CoV-2 RNA is generally  detectable in upper and lower respiratory sp ecimens during the acute  phase of infection. The expected result is Negative. Fact Sheet for Patients:  StrictlyIdeas.no Fact Sheet for Healthcare Providers: BankingDealers.co.za This test is not yet approved or cleared by the [place] FDA and has been authorized for detection and/or diagnosis of SARS-CoV-2 by FDA under an Emergency Use Authorization (EUA).  This EUA will remain in effect (meaning this test can be used) for the duration of the COVID-19 declaration under Section 564(b)(1) of the Act, 21 U.S.C. section 360bbb-3(b)(1), unless the authorization is terminated or revoked sooner. Performed at [facility], [address], [place], [place] [address]      Time coordinating discharge:35 minutes spent in the coordination of care and discharge today.  SIGNED:   Jonnie Finner, DO  [facility] 03/11/2019, 4:47 PM Pager   If 7PM-7AM, please contact night-coverage [email or web address] Password [email or web address]

## 2019-03-11 NOTE — Plan of Care (Signed)
progressing 

## 2019-03-11 NOTE — Consult Note (Addendum)
NEUROLOGY CONSULT  Reason for Consult: Neurologic opinion for headache Referring Physician: Dr Olevia Bowens CC: headache  HPI: Cody Shepard is an 53 y.o. male with PMH of asthma, COPD, CAD, DM2, CKD3, HTN, HLD and headaches. On 5/16 he went to [facility] for a progressive worsening headache that started after a funeral for his buddy on Friday evening 5/15. He had associated blurred vision and dizziness. He denies visual scotomas, photophobia or phonophobia. He has history of headaches, but they are not usually accompanied by blurred vision/dizziness and usually go away in one day with rest and Advil. He was transferred to [facility] for further work up. CTH, CTA head/neck & MRI brain were done and all neg for acute findings. He does have extensive small vessel disease.   Past Medical History Past Medical History:  Diagnosis Date  . Asthma   . Bronchitis   . COPD (chronic obstructive pulmonary disease) ([place])   . Coronary artery disease   . Diabetes mellitus without complication ([place])   . Headache   . High cholesterol   . Hypertension     Past Surgical History Past Surgical History:  Procedure Laterality Date  . LAPAROSCOPIC ADRENALECTOMY Left 01/04/2019   Procedure: LAPAROSCOPIC LEFT ADRENALECTOMY;  Surgeon: Armandina Gemma, MD;  Location: [facility]S;  Service: General;  Laterality: Left;  . NO PAST SURGERIES      Family History Family History  Problem Relation Age of Onset  . Diabetes Mother   . Hypertension Mother   . Stroke Father     Social History    reports that he quit smoking about 21 years ago. His smoking use included cigarettes. He smoked 1.00 pack per day. He has never used smokeless tobacco. He reports that he does not drink alcohol or use drugs.  Allergies No Known Allergies  Home Medications Medications Prior to Admission  Medication Sig Dispense Refill  . albuterol (PROVENTIL HFA;VENTOLIN HFA) 108 (90 BASE) MCG/ACT inhaler Inhale 2 puffs into the lungs every 4 (four) hours as needed  for wheezing or shortness of breath.     . Ascorbic Acid (VITAMIN C) 1000 MG tablet Take 1,000 mg by mouth daily.    Marland Kitchen atorvastatin (LIPITOR) 20 MG tablet Take 20 mg by mouth at bedtime.     . carvedilol (COREG) 25 MG tablet Take 25 mg by mouth 2 (two) times daily with a meal.    . cloNIDine (CATAPRES - DOSED IN MG/24 HR) 0.3 mg/24hr patch Place 0.3 mg onto the skin every Sunday.     . fluticasone (FLONASE) 50 MCG/ACT nasal spray Place 2 sprays into both nostrils 2 (two) times a day.     . Fluticasone-Umeclidin-Vilant (TRELEGY ELLIPTA) 100-62.5-25 MCG/INH AEPB Inhale 1 Inhaler into the lungs daily.    . metFORMIN (GLUCOPHAGE) 1000 MG tablet Take 500 mg by mouth 2 (two) times daily.     Marland Kitchen olmesartan (BENICAR) 20 MG tablet Take 20 mg by mouth daily.    . ranolazine (RANEXA) 500 MG 12 hr tablet Take 500 mg by mouth 2 (two) times daily.      Hospital Medications .  stroke: mapping our early stages of recovery book   Does not apply Once  . atorvastatin  20 mg Oral QHS  . cloNIDine  0.3 mg Transdermal Q Sun  . colchicine  0.6 mg Oral BID  . fluticasone  2 spray Each Nare Daily  . umeclidinium bromide  1 puff Inhalation Daily   And  . fluticasone furoate-vilanterol  1 puff  Inhalation Daily  . hydrALAZINE  100 mg Oral TID  . montelukast  10 mg Oral Daily  . ranolazine  500 mg Oral BID     ROS: History obtained from pt  General ROS: negative for - chills, fatigue, fever, night sweats, weight gain or weight loss Psychological ROS: negative for - behavioral disorder, hallucinations, memory difficulties, mood swings or suicidal ideation Ophthalmic ROS: negative for - double vision, eye pain or loss of vision. + blurry vision ENT ROS: negative for - epistaxis, nasal discharge, oral lesions, sore throat, tinnitus or vertigo Allergy and Immunology ROS: negative for - hives or itchy/watery eyes Hematological and Lymphatic ROS: negative for - bleeding problems, bruising or swollen lymph  nodes Endocrine ROS: negative for - galactorrhea, hair pattern changes, polydipsia/polyuria or temperature intolerance Respiratory ROS: negative for - cough, hemoptysis, shortness of breath or wheezing Cardiovascular ROS: negative for - chest pain, dyspnea on exertion, edema or irregular heartbeat Gastrointestinal ROS: negative for - abdominal pain, diarrhea, hematemesis, nausea/vomiting or stool incontinence Genito-Urinary ROS: negative for - dysuria, hematuria, incontinence or urinary frequency/urgency Musculoskeletal ROS: negative for - joint swelling or muscular weakness Neurological ROS: as noted in HPI Dermatological ROS: negative for rash and skin lesion changes   Physical Examination:  Vitals:   03/11/19 0414 03/11/19 0837 03/11/19 0842 03/11/19 1225  BP: 129/88 (!) 140/98  135/83  Pulse: 73 71  67  Resp: 18 16  18   Temp: 98.2 F (36.8 C) 98.2 F (36.8 C)  (!) 97.4 F (36.3 C)  TempSrc: Oral Oral  Oral  SpO2: 99% 100% 97% 100%    General - no acute distress Heart - Regular rate and rhythm - no murmer Lungs - Clear to auscultation Abdomen - Soft - non tender Extremities - Distal pulses intact - no edema Skin - Warm and dry  Neurologic Examination:   Mental Status:  Alert, oriented, thought content appropriate. Speech without evidence of dysarthria or aphasia. Able to follow 3 step commands without difficulty.  Cranial Nerves:  II-bilateral visual fields intact; slow horizontal nystagmus noted III/IV/VI-Pupils were equal and reacted. Extraocular movements were full.  V/VII-no facial numbness and no facial weakness.  VIII-hearing normal.  X-normal speech and symmetrical palatal movement.  XII-midline tongue extension  Motor: Right : Upper extremity   5/5    Left:     Upper extremity   5/5  Lower extremity   5/5     Lower extremity   5/5 Tone and bulk:normal tone throughout; no atrophy noted Sensory: Intact to light touch in all extremities. Deep Tendon Reflexes:  2/4 throughout Plantars: Downgoing bilaterally  Cerebellar: Normal finger to nose and heel to shin bilaterally. Gait: not tested   LABORATORY STUDIES:  Basic Metabolic Panel: Recent Labs  Lab 03/10/19 0010 03/10/19 0028  NA 133*  --   K 3.5  --   CL 100  --   CO2 22  --   GLUCOSE 106*  --   BUN 18  --   CREATININE 1.82* 1.60*  CALCIUM 8.8*  --     Liver Function Tests: No results for input(s): AST, ALT, ALKPHOS, BILITOT, PROT, ALBUMIN in the last 168 hours. No results for input(s): LIPASE, AMYLASE in the last 168 hours. No results for input(s): AMMONIA in the last 168 hours.  CBC: Recent Labs  Lab 03/10/19 0010  WBC 4.8  NEUTROABS 1.6*  HGB 10.4*  HCT 31.1*  MCV 92.6  PLT 214    Cardiac Enzymes: Recent Labs  Lab 03/10/19 0010  TROPONINI <0.03    BNP: Invalid input(s): POCBNP  CBG: No results for input(s): GLUCAP in the last 168 hours.  Microbiology:   Coagulation Studies: Recent Labs    03/10/19 0010  LABPROT 13.6  INR 1.0    Urinalysis:  Recent Labs  Lab 03/10/19 0012  COLORURINE YELLOW  LABSPEC 1.008  PHURINE 5.0  GLUCOSEU NEGATIVE  HGBUR NEGATIVE  BILIRUBINUR NEGATIVE  KETONESUR NEGATIVE  PROTEINUR NEGATIVE  NITRITE NEGATIVE  LEUKOCYTESUR NEGATIVE    Lipid Panel:     Component Value Date/Time   CHOL 189 03/10/2019 0510   TRIG 110 03/10/2019 0510   HDL 35 (L) 03/10/2019 0510   CHOLHDL 5.4 03/10/2019 0510   VLDL 22 03/10/2019 0510   LDLCALC 132 (H) 03/10/2019 0510    HgbA1C:  Lab Results  Component Value Date   HGBA1C 6.3 (H) 03/10/2019    Urine Drug Screen:      Component Value Date/Time   LABOPIA NONE DETECTED 03/10/2019 0012   COCAINSCRNUR NONE DETECTED 03/10/2019 0012   LABBENZ NONE DETECTED 03/10/2019 0012   AMPHETMU NONE DETECTED 03/10/2019 0012   THCU NONE DETECTED 03/10/2019 0012   LABBARB NONE DETECTED 03/10/2019 0012     Alcohol Level:  Recent Labs  Lab 03/10/19 0010  ETH <10    Miscellaneous  labs:  EKG  EKG  IMAGING: Ct Angio Head W Or Wo Contrast  Result Date: 03/10/2019 CLINICAL DATA:  Code stroke. Dizziness and headache. Blurry vision. EXAM: CT HEAD WITHOUT CONTRAST CT ANGIOGRAPHY OF THE HEAD AND NECK TECHNIQUE: Contiguous axial images were obtained from the base of the skull through the vertex without intravenous contrast. Multidetector CT imaging of the head and neck was performed using the standard protocol during bolus administration of intravenous contrast. Multiplanar CT image reconstructions and MIPs were obtained to evaluate the vascular anatomy. Carotid stenosis measurements (when applicable) are obtained utilizing NASCET criteria, using the distal internal carotid diameter as the denominator. CONTRAST:  113mL OMNIPAQUE IOHEXOL 350 MG/ML SOLN COMPARISON:  Head CT 01/05/2013 FINDINGS: CT HEAD FINDINGS Brain: There is no mass, hemorrhage or extra-axial collection. The size and configuration of the ventricles and extra-axial CSF spaces are normal. 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 No evidence of acute cortical infarct. Vascular: No abnormal hyperdensity of the major intracranial arteries or dural venous sinuses. No intracranial atherosclerosis. Skull: The visualized skull base, calvarium and extracranial soft tissues are normal. Sinuses/Orbits: No fluid levels or advanced mucosal thickening of the visualized paranasal sinuses. No mastoid or middle ear effusion. The orbits are normal. ASPECTS ([place] Stroke Program Early CT Score) - Ganglionic level infarction (caudate, lentiform nuclei, internal capsule, insula, M1-M3 cortex): 7 - Supraganglionic infarction (M4-M6 cortex): 3 Total score (0-10 with 10 being normal): 10 CTA NECK FINDINGS SKELETON: There is no bony spinal canal stenosis. No lytic or blastic lesion. OTHER NECK: Normal pharynx, larynx and major salivary glands. No cervical lymphadenopathy. Unremarkable thyroid gland. UPPER  CHEST: No pneumothorax or pleural effusion. No nodules or masses. AORTIC ARCH: There is no calcific atherosclerosis of the aortic arch. There is no aneurysm, dissection or hemodynamically significant stenosis of the visualized ascending aorta and aortic arch. Conventional 3 vessel aortic branching pattern. The visualized proximal subclavian arteries are widely patent. RIGHT CAROTID SYSTEM: --Common carotid artery: Widely patent origin without common carotid artery dissection or aneurysm. --Internal carotid artery: Normal without aneurysm, dissection or stenosis. --External carotid artery: No acute abnormality. LEFT CAROTID  SYSTEM: --Common carotid artery: Widely patent origin without common carotid artery dissection or aneurysm. --Internal carotid artery: Normal without aneurysm, dissection or stenosis. --External carotid artery: No acute abnormality. VERTEBRAL ARTERIES: Right dominant configuration. Both origins are normal. No dissection, occlusion or flow-limiting stenosis to the vertebrobasilar confluence. CTA HEAD FINDINGS POSTERIOR CIRCULATION: --Vertebral arteries: Normal codominant configuration of V4 segments. --Posterior inferior cerebellar arteries (PICA): Patent origins from the vertebral arteries. --Anterior inferior cerebellar arteries (AICA): Patent origins from the basilar artery. --Basilar artery: Normal. --Superior cerebellar arteries: Normal. --Posterior cerebral arteries (PCA): Normal. Both are predominantly supplied by the posterior communicating arteries (p-comm). ANTERIOR CIRCULATION: --Intracranial internal carotid arteries: Normal. --Anterior cerebral arteries (ACA): Normal. Both A1 segments are present. Patent anterior communicating artery (a-comm). --Middle cerebral arteries (MCA): Normal. VENOUS SINUSES: As permitted by contrast timing, patent. ANATOMIC VARIANTS: None Review of the MIP images confirms the above findings. IMPRESSION: 1. No acute hemorrhage.  Is 10. 2. No emergent large  vessel occlusion or high-grade stenosis. These results were called by telephone at the time of interpretation on 03/10/2019 at 1:03 am to Dr. Ezequiel Essex , who verbally acknowledged these results. Electronically Signed   By: Ulyses Jarred M.D.   On: 03/10/2019 01:14   Ct Angio Neck W Or Wo Contrast  Result Date: 03/10/2019 CLINICAL DATA:  Code stroke. Dizziness and headache. Blurry vision. EXAM: CT HEAD WITHOUT CONTRAST CT ANGIOGRAPHY OF THE HEAD AND NECK TECHNIQUE: Contiguous axial images were obtained from the base of the skull through the vertex without intravenous contrast. Multidetector CT imaging of the head and neck was performed using the standard protocol during bolus administration of intravenous contrast. Multiplanar CT image reconstructions and MIPs were obtained to evaluate the vascular anatomy. Carotid stenosis measurements (when applicable) are obtained utilizing NASCET criteria, using the distal internal carotid diameter as the denominator. CONTRAST:  171mL OMNIPAQUE IOHEXOL 350 MG/ML SOLN COMPARISON:  Head CT 01/05/2013 FINDINGS: CT HEAD FINDINGS Brain: There is no mass, hemorrhage or extra-axial collection. The size and configuration of the ventricles and extra-axial CSF spaces are normal. 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 No evidence of acute cortical infarct. Vascular: No abnormal hyperdensity of the major intracranial arteries or dural venous sinuses. No intracranial atherosclerosis. Skull: The visualized skull base, calvarium and extracranial soft tissues are normal. Sinuses/Orbits: No fluid levels or advanced mucosal thickening of the visualized paranasal sinuses. No mastoid or middle ear effusion. The orbits are normal. ASPECTS ([place] Stroke Program Early CT Score) - Ganglionic level infarction (caudate, lentiform nuclei, internal capsule, insula, M1-M3 cortex): 7 - Supraganglionic infarction (M4-M6 cortex): 3 Total score (0-10 with 10  being normal): 10 CTA NECK FINDINGS SKELETON: There is no bony spinal canal stenosis. No lytic or blastic lesion. OTHER NECK: Normal pharynx, larynx and major salivary glands. No cervical lymphadenopathy. Unremarkable thyroid gland. UPPER CHEST: No pneumothorax or pleural effusion. No nodules or masses. AORTIC ARCH: There is no calcific atherosclerosis of the aortic arch. There is no aneurysm, dissection or hemodynamically significant stenosis of the visualized ascending aorta and aortic arch. Conventional 3 vessel aortic branching pattern. The visualized proximal subclavian arteries are widely patent. RIGHT CAROTID SYSTEM: --Common carotid artery: Widely patent origin without common carotid artery dissection or aneurysm. --Internal carotid artery: Normal without aneurysm, dissection or stenosis. --External carotid artery: No acute abnormality. LEFT CAROTID SYSTEM: --Common carotid artery: Widely patent origin without common carotid artery dissection or aneurysm. --Internal carotid artery: Normal without aneurysm, dissection or stenosis. --External  carotid artery: No acute abnormality. VERTEBRAL ARTERIES: Right dominant configuration. Both origins are normal. No dissection, occlusion or flow-limiting stenosis to the vertebrobasilar confluence. CTA HEAD FINDINGS POSTERIOR CIRCULATION: --Vertebral arteries: Normal codominant configuration of V4 segments. --Posterior inferior cerebellar arteries (PICA): Patent origins from the vertebral arteries. --Anterior inferior cerebellar arteries (AICA): Patent origins from the basilar artery. --Basilar artery: Normal. --Superior cerebellar arteries: Normal. --Posterior cerebral arteries (PCA): Normal. Both are predominantly supplied by the posterior communicating arteries (p-comm). ANTERIOR CIRCULATION: --Intracranial internal carotid arteries: Normal. --Anterior cerebral arteries (ACA): Normal. Both A1 segments are present. Patent anterior communicating artery (a-comm).  --Middle cerebral arteries (MCA): Normal. VENOUS SINUSES: As permitted by contrast timing, patent. ANATOMIC VARIANTS: None Review of the MIP images confirms the above findings. IMPRESSION: 1. No acute hemorrhage.  Is 10. 2. No emergent large vessel occlusion or high-grade stenosis. These results were called by telephone at the time of interpretation on 03/10/2019 at 1:03 am to Dr. Ezequiel Essex , who verbally acknowledged these results. Electronically Signed   By: Ulyses Jarred M.D.   On: 03/10/2019 01:14   Mr Brain Wo Contrast  Result Date: 03/10/2019 CLINICAL DATA:  Headache, acute, severe, worst headache of life. EXAM: MRI HEAD WITHOUT CONTRAST TECHNIQUE: Multiplanar, multiecho pulse sequences of the brain and surrounding structures were obtained without intravenous contrast. COMPARISON:  CT head without contrast 03/10/2019. CT head without contrast 01/05/2013 FINDINGS: Brain: No acute infarct, hemorrhage, or mass lesion is present. The ventricles are of normal size. No significant extraaxial fluid collection is present. Moderate diffuse white matter disease is chronic and stable. Vascular: A remote lacunar infarct is present in the right cerebellar white matter. Basal ganglia are intact. Brainstem and cerebellum are otherwise normal. Skull and upper cervical spine: Moderate degenerative changes are present at C2-3 disc space. There is chronic endplate change. Skull base is normal. Craniocervical junction is otherwise normal. Sinuses/Orbits: Chronic circumferential mucosal thickening is present in the right maxillary sinus. A fluid level is present. The paranasal sinuses and mastoid air cells are otherwise clear. Globes and orbits are within normal limits. IMPRESSION: 1. Extensive white matter disease similar to prior studies. This likely reflects the sequela of chronic microvascular ischemia in this patient with stage 3 chronic kidney disease, hypertension, and type 2 diabetes. 2. No acute intracranial  abnormality. 3. Advanced degenerative changes at C2-3. Electronically Signed   By: San Morelle M.D.   On: 03/10/2019 14:34   Ct Head Code Stroke Wo Contrast  Result Date: 03/10/2019 CLINICAL DATA:  Code stroke. Dizziness and headache. Blurry vision. EXAM: CT HEAD WITHOUT CONTRAST CT ANGIOGRAPHY OF THE HEAD AND NECK TECHNIQUE: Contiguous axial images were obtained from the base of the skull through the vertex without intravenous contrast. Multidetector CT imaging of the head and neck was performed using the standard protocol during bolus administration of intravenous contrast. Multiplanar CT image reconstructions and MIPs were obtained to evaluate the vascular anatomy. Carotid stenosis measurements (when applicable) are obtained utilizing NASCET criteria, using the distal internal carotid diameter as the denominator. CONTRAST:  145mL OMNIPAQUE IOHEXOL 350 MG/ML SOLN COMPARISON:  Head CT 01/05/2013 FINDINGS: CT HEAD FINDINGS Brain: There is no mass, hemorrhage or extra-axial collection. The size and configuration of the ventricles and extra-axial CSF spaces are normal. 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 No evidence of acute cortical infarct. Vascular: No abnormal hyperdensity of the major intracranial arteries or dural venous sinuses. No intracranial atherosclerosis. Skull: The visualized skull base, calvarium  and extracranial soft tissues are normal. Sinuses/Orbits: No fluid levels or advanced mucosal thickening of the visualized paranasal sinuses. No mastoid or middle ear effusion. The orbits are normal. ASPECTS ([place] Stroke Program Early CT Score) - Ganglionic level infarction (caudate, lentiform nuclei, internal capsule, insula, M1-M3 cortex): 7 - Supraganglionic infarction (M4-M6 cortex): 3 Total score (0-10 with 10 being normal): 10 CTA NECK FINDINGS SKELETON: There is no bony spinal canal stenosis. No lytic or blastic lesion. OTHER NECK: Normal  pharynx, larynx and major salivary glands. No cervical lymphadenopathy. Unremarkable thyroid gland. UPPER CHEST: No pneumothorax or pleural effusion. No nodules or masses. AORTIC ARCH: There is no calcific atherosclerosis of the aortic arch. There is no aneurysm, dissection or hemodynamically significant stenosis of the visualized ascending aorta and aortic arch. Conventional 3 vessel aortic branching pattern. The visualized proximal subclavian arteries are widely patent. RIGHT CAROTID SYSTEM: --Common carotid artery: Widely patent origin without common carotid artery dissection or aneurysm. --Internal carotid artery: Normal without aneurysm, dissection or stenosis. --External carotid artery: No acute abnormality. LEFT CAROTID SYSTEM: --Common carotid artery: Widely patent origin without common carotid artery dissection or aneurysm. --Internal carotid artery: Normal without aneurysm, dissection or stenosis. --External carotid artery: No acute abnormality. VERTEBRAL ARTERIES: Right dominant configuration. Both origins are normal. No dissection, occlusion or flow-limiting stenosis to the vertebrobasilar confluence. CTA HEAD FINDINGS POSTERIOR CIRCULATION: --Vertebral arteries: Normal codominant configuration of V4 segments. --Posterior inferior cerebellar arteries (PICA): Patent origins from the vertebral arteries. --Anterior inferior cerebellar arteries (AICA): Patent origins from the basilar artery. --Basilar artery: Normal. --Superior cerebellar arteries: Normal. --Posterior cerebral arteries (PCA): Normal. Both are predominantly supplied by the posterior communicating arteries (p-comm). ANTERIOR CIRCULATION: --Intracranial internal carotid arteries: Normal. --Anterior cerebral arteries (ACA): Normal. Both A1 segments are present. Patent anterior communicating artery (a-comm). --Middle cerebral arteries (MCA): Normal. VENOUS SINUSES: As permitted by contrast timing, patent. ANATOMIC VARIANTS: None Review of the MIP  images confirms the above findings. IMPRESSION: 1. No acute hemorrhage.  Is 10. 2. No emergent large vessel occlusion or high-grade stenosis. These results were called by telephone at the time of interpretation on 03/10/2019 at 1:03 am to Dr. Ezequiel Essex , who verbally acknowledged these results. Electronically Signed   By: Ulyses Jarred M.D.   On: 03/10/2019 01:14     Assessment/Plan: This is 53yr old with PMH of asthma, COPD, CAD, DM2, CKD3, HTN, HLD and headaches. On 5/16 he went to [facility] ER for a progressive worsening headache that started after a funeral for his buddy on Friday evening 5/15. He had associated blurred vision and dizziness. He denies visual scotomas, photophobia or phonophobia. He has history of headaches, but they are not usually accompanied by blurred vision/dizziness and usually go away in one day with rest and Advil. He was transferred to [facility] for further work up. CTH, CTA head/neck & MRI brain were done and all neg for acute findings. He does have extensive small vessel disease.   # Headache- suspect complex migraine in setting of stress over loss of loved one. Pt states Advil and rest usually helps his headaches.  # Blurred vision/dizziness- d/t above. Improving.  Other medical issues: # HTN- normotensive goal, no role in permissive and in fact this may exacerbate h/a symptoms if too elevated. #CKD3, COPD, CAD, HLD, DM2- con't max medical mgt for comorbidities. Added ASA daily and con't statin and BP meds  RECS: Continue all vascular med mgt ASA 81mg  daily (indicated d/t CAD and extensive cerebral small vessel ischemic disease)  Benadryl 50mg  IV now, repeat at HS Ibuprofen 400mg  PO now Hydrate with PO fluids, IVF if necessary Can alternate acetaminophen/Ibuprofen for symptomatic h/a relief   Desiree Metzger-Cihelka, ARNP-C, ANVP-BC Attending neurologist's note to follow  I have seen the patient and reviewed the above note.  I suspect complicated migraine as an  etiology of his symptoms, though he does not have acute stroke he does have a chronic stroke on his MRI and therefore will need secondary risk factor modification including LDL goal less than 70, diabetes control, blood pressure control.  His headache is improved and his neurological symptoms are resolved, therefore no current inpatient recommendations.  Continue antiplatelet therapy with aspirin.  Please call if neurology can be of any further assistance.  Roland Rack, MD [facility] [phone]  If 7pm- 7am, please page neurology on call as listed in [place].

## 2019-03-11 NOTE — Progress Notes (Signed)
OT Cancellation Note  Patient Details Name: Cody Shepard MRN: [id number] DOB: 1966/02/10   Cancelled Treatment:    Reason Eval/Treat Not Completed: OT screened, no needs identified, will sign off.  Pt reports he feels he is back to baseline, and MRI negative for acute infarct.  Lucille Passy, OTR/L Acute Rehabilitation Services Pager [phone] Office [place], [place] 03/11/2019, 4:04 PM

## 2019-03-11 NOTE — Care Management Obs Status (Signed)
[facility] NOTIFICATION   Patient Details  Name: Cody Shepard MRN: [id number] Date of Birth: May 26, 1966   Medicare Observation Status Notification Given:  Yes    [place], [place] 03/11/2019, 12:24 PM

## 2019-03-14 ENCOUNTER — Encounter: Payer: Self-pay | Admitting: "Endocrinology

## 2019-03-14 ENCOUNTER — Other Ambulatory Visit: Payer: Self-pay

## 2019-03-14 ENCOUNTER — Ambulatory Visit (INDEPENDENT_AMBULATORY_CARE_PROVIDER_SITE_OTHER): Payer: Medicare Other | Admitting: "Endocrinology

## 2019-03-14 DIAGNOSIS — E1165 Type 2 diabetes mellitus with hyperglycemia: Secondary | ICD-10-CM | POA: Insufficient documentation

## 2019-03-14 DIAGNOSIS — E269 Hyperaldosteronism, unspecified: Secondary | ICD-10-CM

## 2019-03-14 DIAGNOSIS — I10 Essential (primary) hypertension: Secondary | ICD-10-CM | POA: Diagnosis not present

## 2019-03-14 NOTE — Progress Notes (Signed)
03/14/2019, 5:43 PM                                 Endocrinology Telehealth Visit Follow up Note -During COVID -19 Pandemic  I connected with Cody Shepard on 03/14/2019   by telephone and verified that I am speaking with the correct person using two identifiers. Cody Shepard, July 09, 1966. he has verbally consented to this visit. All issues noted in this document were discussed and addressed. The format was not optimal for physical exam.   Subjective:    Patient ID: Cody Shepard, male    DOB: 09-Aug-1966, PCP Abran Richard, MD   Past Medical History:  Diagnosis Date  . Asthma   . Bronchitis   . COPD (chronic obstructive pulmonary disease) ([place])   . Coronary artery disease   . Diabetes mellitus without complication ([place])   . Headache   . High cholesterol   . Hypertension    Past Surgical History:  Procedure Laterality Date  . LAPAROSCOPIC ADRENALECTOMY Left 01/04/2019   Procedure: LAPAROSCOPIC LEFT ADRENALECTOMY;  Surgeon: Armandina Gemma, MD;  Location: [facility];  Service: General;  Laterality: Left;  . NO PAST SURGERIES     Social History   Socioeconomic History  . Marital status: Single    Spouse name: Not on file  . Number of children: Not on file  . Years of education: Not on file  . Highest education level: Not on file  Occupational History  . Not on file  Social Needs  . Financial resource strain: Not on file  . Food insecurity:    Worry: Not on file    Inability: Not on file  . Transportation needs:    Medical: Not on file    Non-medical: Not on file  Tobacco Use  . Smoking status: Former Smoker    Packs/day: 1.00    Types: Cigarettes    Last attempt to quit: 08/30/1997    Years since quitting: 21.5  . Smokeless tobacco: Never Used  Substance and Sexual Activity  . Alcohol use: No  . Drug use: No  . Sexual activity: Not on file  Lifestyle  . Physical activity:    Days per week: Not on file    Minutes per  session: Not on file  . Stress: Not on file  Relationships  . Social connections:    Talks on phone: Not on file    Gets together: Not on file    Attends religious service: Not on file    Active member of club or organization: Not on file    Attends meetings of clubs or organizations: Not on file    Relationship status: Not on file  Other Topics Concern  . Not on file  Social History Narrative  . Not on file   Outpatient Encounter Medications as of 03/14/2019  Medication Sig  . albuterol (PROVENTIL HFA;VENTOLIN HFA) 108 (90 BASE) MCG/ACT inhaler Inhale 2 puffs into the lungs every 4 (four) hours as needed for wheezing or shortness of breath.   . Ascorbic Acid (VITAMIN C) 1000 MG tablet Take 1,000 mg by mouth daily.  Marland Kitchen aspirin EC 81 MG EC tablet  Take 1 tablet (81 mg total) by mouth daily.  Marland Kitchen atorvastatin (LIPITOR) 20 MG tablet Take 20 mg by mouth at bedtime.   . carvedilol (COREG) 25 MG tablet Take 25 mg by mouth 2 (two) times daily with a meal.  . cloNIDine (CATAPRES - DOSED IN MG/24 HR) 0.3 mg/24hr patch Place 0.3 mg onto the skin every Sunday.   . fluticasone (FLONASE) 50 MCG/ACT nasal spray Place 2 sprays into both nostrils 2 (two) times a day.   . Fluticasone-Umeclidin-Vilant (TRELEGY ELLIPTA) 100-62.5-25 MCG/INH AEPB Inhale 1 Inhaler into the lungs daily.  . metFORMIN (GLUCOPHAGE) 1000 MG tablet Take 500 mg by mouth 2 (two) times daily.   Marland Kitchen olmesartan (BENICAR) 20 MG tablet Take 20 mg by mouth daily.  . ranolazine (RANEXA) 500 MG 12 hr tablet Take 500 mg by mouth 2 (two) times daily.   No facility-administered encounter medications on file as of 03/14/2019.    ALLERGIES: No Known Allergies  VACCINATION STATUS:  There is no immunization history on file for this patient.  HPI Cody Shepard is 53 y.o. male who presents today with a medical history as above. -He was diagnosed with primary hyperaldosteronism with a left adrenal adenoma.  -He underwent left adrenalectomy on January 04, 2019 which revealed aldosteronoma.  He is currently on 3 antihypertensive medications compared to 6-7 medications before.   -He is potassium was discontinued by his cardiologist in the interval, has not taken any potassium for the last 2 months.  His previsit labs show a potassium of 3.5.    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   -He has multiple medical problems including type 2 diabetes controlled on metformin, chronic severe hypertension  requiring currently 7 different medications.   He also has hyperlipidemia, coronary artery disease on medical management. -He smoked heavily in the past, not a current smoker. He is not physically  active generally. -Patient denies any family history of adrenal, thyroid, pituitary, nor parathyroid dysfunctions. -His father was diagnosed with CVA at age of 48, and later died of complications from coronary artery disease.  He denies family history of syndromic kidney dysfunctions.  -He is known to have migraine headaches, currently denies headaches, chest pain, shortness of breath.  He denies any rapid weight change.  No palpitations, diaphoresis, nor fainting spells.  He was initially  referred by his cardiologist for hypokalemia.   Review of Systems   Objective:    There were no vitals taken for this visit.  Wt Readings from Last 3 Encounters:  01/04/19 195 lb 12.3 oz (88.8 kg)  01/03/19 185 lb (83.9 kg)  10/02/18 198 lb (89.8 kg)    Physical Exam   Recent Results (from the past 2160 hour(s))  Glucose, capillary     Status: None   Collection Time: 01/03/19  9:02 AM  Result Value Ref Range   Glucose-Capillary 98 70 - 99 mg/dL  Hemoglobin A1c     Status: None   Collection Time: 01/03/19  9:27 AM  Result Value Ref Range   Hgb A1c MFr Bld 5.5 4.8 - 5.6 %    Comment: (NOTE) Pre diabetes:          5.7%-6.4% Diabetes:              >6.4% Glycemic control for   <7.0% adults with diabetes    Mean  Plasma Glucose 111.15 mg/dL    Comment: Performed at [facility], [address] [place], [place] [address]  Basic metabolic panel     Status: Abnormal   Collection Time: 01/03/19  9:27 AM  Result Value Ref Range   Sodium 137 135 - 145 mmol/L   Potassium 3.4 (L) 3.5 - 5.1 mmol/L   Chloride 104 98 - 111 mmol/L   CO2 26 22 - 32 mmol/L   Glucose, Bld 113 (H) 70 - 99 mg/dL   BUN 17 6 - 20 mg/dL   Creatinine, Ser 1.20 0.61 - 1.24 mg/dL   Calcium 9.0 8.9 - 10.3 mg/dL   GFR calc non Af Amer >60 >60 mL/min   GFR calc Af Amer >60 >60 mL/min   Anion gap 7 5 - 15    Comment: Performed at [facility], [place] [address] [place], [place] [address]  CBC     Status: Abnormal   Collection Time: 01/03/19  9:27 AM  Result Value Ref Range   WBC 3.3 (L) 4.0 - 10.5 K/uL   RBC 3.72 (L) 4.22 - 5.81 MIL/uL   Hemoglobin 11.6 (L) 13.0 - 17.0 g/dL   HCT 36.5 (L) 39.0 - 52.0 %   MCV 98.1 80.0 - 100.0 fL   MCH 31.2 26.0 - 34.0 pg   MCHC 31.8 30.0 - 36.0 g/dL   RDW 13.5 11.5 - 15.5 %   Platelets 203 150 - 400 K/uL   nRBC 0.0 0.0 - 0.2 %    Comment: Performed at [facility], [place] [address], [place], [place] [address]  Glucose, capillary     Status: Abnormal   Collection Time: 01/04/19 11:39 AM  Result Value Ref Range   Glucose-Capillary 119 (H) 70 - 99 mg/dL  Type and screen [place]     Status: None   Collection Time: 01/04/19 11:55 AM  Result Value Ref Range   ABO/RH(D) A POS    Antibody Screen NEG    Sample Expiration      01/07/2019 Performed at [facility], [place] [address], [place], [place] [address]   ABO/Rh     Status: None   Collection Time: 01/04/19 11:55 AM  Result Value Ref Range   ABO/RH(D)      A POS Performed at [facility], [place] [address], [place], [place] [address]   Glucose, capillary     Status: Abnormal   Collection Time: 01/04/19  5:58 PM  Result Value Ref Range    Glucose-Capillary 160 (H) 70 - 99 mg/dL  Glucose, capillary     Status: Abnormal   Collection Time: 01/04/19  8:39 PM  Result Value Ref Range   Glucose-Capillary 160 (H) 70 - 99 mg/dL   Comment 1 Notify RN   Basic metabolic panel     Status: Abnormal   Collection Time: 01/05/19  3:13 AM  Result Value Ref Range   Sodium 134 (L) 135 - 145 mmol/L   Potassium 3.1 (L) 3.5 - 5.1 mmol/L   Chloride 97 (L) 98 - 111 mmol/L   CO2 26 22 - 32 mmol/L   Glucose, Bld 194 (H) 70 - 99 mg/dL   BUN 14 6 - 20 mg/dL   Creatinine, Ser 1.00 0.61 - 1.24 mg/dL   Calcium 8.2 (L) 8.9 - 10.3 mg/dL   GFR calc non Af Amer >60 >60 mL/min   GFR calc Af Amer >60 >60 mL/min   Anion gap 11 5 - 15    Comment: Performed at [facility], [address] [place], [place] [address]  CBC     Status: Abnormal   Collection Time: 01/05/19  3:13 AM  Result Value Ref Range   WBC 6.0 4.0 - 10.5 K/uL   RBC 3.71 (L) 4.22 - 5.81 MIL/uL   Hemoglobin 11.6 (L) 13.0 - 17.0 g/dL   HCT 36.6 (L) 39.0 - 52.0 %   MCV 98.7 80.0 - 100.0 fL   MCH 31.3 26.0 - 34.0 pg   MCHC 31.7 30.0 - 36.0 g/dL   RDW 13.2 11.5 - 15.5 %   Platelets 197 150 - 400 K/uL   nRBC 0.0 0.0 - 0.2 %    Comment: Performed at [facility], [place] [address] [place], [place] [address]  Glucose, capillary     Status: Abnormal   Collection Time: 01/05/19  7:31 AM  Result Value Ref Range   Glucose-Capillary 134 (H) 70 - 99 mg/dL  Glucose, capillary     Status: Abnormal   Collection Time: 01/05/19 11:47 AM  Result Value Ref Range   Glucose-Capillary 132 (H) 70 - 99 mg/dL  Glucose, capillary     Status: Abnormal   Collection Time: 01/05/19  4:02 PM  Result Value Ref Range   Glucose-Capillary 111 (H) 70 - 99 mg/dL   Comment 1 Notify RN    Comment 2 Document in Chart   Glucose, capillary     Status: Abnormal   Collection Time: 01/05/19  9:41 PM  Result Value Ref Range   Glucose-Capillary 118 (H) 70 - 99 mg/dL   Comment 1 Notify  RN   Basic metabolic panel     Status: Abnormal   Collection Time: 01/06/19  3:04 AM  Result Value Ref Range   Sodium 139 135 - 145 mmol/L   Potassium 3.7 3.5 - 5.1 mmol/L   Chloride 105 98 - 111 mmol/L   CO2 27 22 - 32 mmol/L   Glucose, Bld 104 (H) 70 - 99 mg/dL   BUN 12 6 - 20 mg/dL   Creatinine, Ser 0.97 0.61 - 1.24 mg/dL   Calcium 8.4 (L) 8.9 - 10.3 mg/dL   GFR calc non Af Amer >60 >60 mL/min   GFR calc Af Amer >60 >60 mL/min   Anion gap 7 5 - 15    Comment: Performed at [facility], [place] [address], [place], [place] [address]  Glucose, capillary     Status: Abnormal   Collection Time: 01/06/19  7:55 AM  Result Value Ref Range   Glucose-Capillary 110 (H) 70 - 99 mg/dL  Glucose, capillary     Status: Abnormal   Collection Time: 01/06/19 11:57 AM  Result Value Ref Range   Glucose-Capillary 103 (H) 70 - 99 mg/dL  Comprehensive metabolic panel     Status: Abnormal   Collection Time: 01/18/19 11:30 AM  Result Value Ref Range   Sodium 134 (L) 135 - 145 mmol/L   Potassium 4.5 3.5 - 5.1 mmol/L   Chloride 102 98 - 111 mmol/L   CO2 24 22 - 32 mmol/L   Glucose, Bld 188 (H) 70 - 99 mg/dL   BUN 25 (H) 6 - 20 mg/dL   Creatinine, Ser 1.99 (H) 0.61 - 1.24 mg/dL   Calcium 9.8 8.9 - 10.3 mg/dL   Total Protein 7.4 6.5 - 8.1 g/dL   Albumin 4.1 3.5 - 5.0 g/dL   AST 56 (H) 15 - 41 U/L   ALT 70 (H) 0 - 44 U/L   Alkaline Phosphatase  54 38 - 126 U/L   Total Bilirubin 0.6 0.3 - 1.2 mg/dL   GFR calc non Af Amer 37 (L) >60 mL/min   GFR calc Af Amer 43 (L) >60 mL/min   Anion gap 8 5 - 15    Comment: Performed at [facility], [address], [place], [place] [address]  CBC with Differential/Platelet     Status: Abnormal   Collection Time: 03/10/19 12:10 AM  Result Value Ref Range   WBC 4.8 4.0 - 10.5 K/uL   RBC 3.36 (L) 4.22 - 5.81 MIL/uL   Hemoglobin 10.4 (L) 13.0 - 17.0 g/dL   HCT 31.1 (L) 39.0 - 52.0 %   MCV 92.6 80.0 - 100.0 fL   MCH 31.0 26.0 - 34.0 pg   MCHC 33.4  30.0 - 36.0 g/dL   RDW 13.5 11.5 - 15.5 %   Platelets 214 150 - 400 K/uL   nRBC 0.0 0.0 - 0.2 %   Neutrophils Relative % 34 %   Neutro Abs 1.6 (L) 1.7 - 7.7 K/uL   Lymphocytes Relative 48 %   Lymphs Abs 2.3 0.7 - 4.0 K/uL   Monocytes Relative 12 %   Monocytes Absolute 0.6 0.1 - 1.0 K/uL   Eosinophils Relative 4 %   Eosinophils Absolute 0.2 0.0 - 0.5 K/uL   Basophils Relative 1 %   Basophils Absolute 0.0 0.0 - 0.1 K/uL   Immature Granulocytes 1 %   Abs Immature Granulocytes 0.04 0.00 - 0.07 K/uL    Comment: Performed at [facility], [address], [place], [place] [address]  Basic metabolic panel     Status: Abnormal   Collection Time: 03/10/19 12:10 AM  Result Value Ref Range   Sodium 133 (L) 135 - 145 mmol/L   Potassium 3.5 3.5 - 5.1 mmol/L   Chloride 100 98 - 111 mmol/L   CO2 22 22 - 32 mmol/L   Glucose, Bld 106 (H) 70 - 99 mg/dL   BUN 18 6 - 20 mg/dL   Creatinine, Ser 1.82 (H) 0.61 - 1.24 mg/dL   Calcium 8.8 (L) 8.9 - 10.3 mg/dL   GFR calc non Af Amer 41 (L) >60 mL/min   GFR calc Af Amer 47 (L) >60 mL/min   Anion gap 11 5 - 15    Comment: Performed at [facility], [address] [place], [place] [address]  Troponin I - ONCE - STAT     Status: None   Collection Time: 03/10/19 12:10 AM  Result Value Ref Range   Troponin I <0.03 <0.03 ng/mL    Comment: Performed at [facility], [address], [place], [place] [address]  Ethanol     Status: None   Collection Time: 03/10/19 12:10 AM  Result Value Ref Range   Alcohol, Ethyl (B) <10 <10 mg/dL    Comment: (NOTE) Lowest detectable limit for serum alcohol is 10 mg/dL. For medical purposes only. Performed at [facility], [address], [place], [place] [address]   Protime-INR     Status: None   Collection Time: 03/10/19 12:10 AM  Result Value Ref Range   Prothrombin Time 13.6 11.4 - 15.2 seconds   INR 1.0 0.8 - 1.2    Comment: (NOTE) INR goal varies based on device and disease states. Performed at [facility],  [address] [place], [place] [address]   APTT     Status: None   Collection Time: 03/10/19 12:10 AM  Result Value Ref Range   aPTT 32  24 - 36 seconds    Comment: Performed at [facility], [address] [place], [place] [address]  Urine rapid drug screen (hosp performed)     Status: None   Collection Time: 03/10/19 12:12 AM  Result Value Ref Range   Opiates NONE DETECTED NONE DETECTED   Cocaine NONE DETECTED NONE DETECTED   Benzodiazepines NONE DETECTED NONE DETECTED   Amphetamines NONE DETECTED NONE DETECTED   Tetrahydrocannabinol NONE DETECTED NONE DETECTED   Barbiturates NONE DETECTED NONE DETECTED    Comment: (NOTE) DRUG SCREEN FOR MEDICAL PURPOSES ONLY.  IF CONFIRMATION IS NEEDED FOR ANY PURPOSE, NOTIFY LAB WITHIN 5 DAYS. LOWEST DETECTABLE LIMITS FOR URINE DRUG SCREEN Drug Class                     Cutoff (ng/mL) Amphetamine and metabolites    1000 Barbiturate and metabolites    200 Benzodiazepine                 765 Tricyclics and metabolites     300 Opiates and metabolites        300 Cocaine and metabolites        300 THC                            50 Performed at [facility], [address] [place] [place], [place] [address]   Urinalysis, Routine w reflex microscopic     Status: None   Collection Time: 03/10/19 12:12 AM  Result Value Ref Range   Color, Urine YELLOW YELLOW   APPearance CLEAR CLEAR   Specific Gravity, Urine 1.008 1.005 - 1.030   pH 5.0 5.0 - 8.0   Glucose, UA NEGATIVE NEGATIVE mg/dL   Hgb urine dipstick NEGATIVE NEGATIVE   Bilirubin Urine NEGATIVE NEGATIVE   Ketones, ur NEGATIVE NEGATIVE mg/dL   Protein, ur NEGATIVE NEGATIVE mg/dL   Nitrite NEGATIVE NEGATIVE   Leukocytes,Ua NEGATIVE NEGATIVE    Comment: Performed at [facility], [address] [place], [place] [address]  I-stat Creatinine, ED     Status: Abnormal   Collection Time: 03/10/19 12:28 AM  Result Value Ref Range   Creatinine, Ser 1.60 (H) 0.61 - 1.24 mg/dL  SARS Coronavirus 2 (CEPHEID -  Performed in [place] hospital lab), Hosp Order     Status: None   Collection Time: 03/10/19  1:56 AM  Result Value Ref Range   SARS Coronavirus 2 NEGATIVE NEGATIVE    Comment: (NOTE) If result is NEGATIVE SARS-CoV-2 target nucleic acids are NOT DETECTED. The SARS-CoV-2 RNA is generally detectable in upper and lower  respiratory specimens during the acute phase of infection. The lowest  concentration of SARS-CoV-2 viral copies this assay can detect is 250  copies / mL. A negative result does not preclude SARS-CoV-2 infection  and should not be used as the sole basis for treatment or other  patient management decisions.  A negative result may occur with  improper specimen collection / handling, submission of specimen other  than nasopharyngeal swab, presence of viral mutation(s) within the  areas targeted by this assay, and inadequate number of viral copies  (<250 copies / mL). A negative result must be combined with clinical  observations, patient history, and epidemiological information. If result is POSITIVE SARS-CoV-2 target nucleic acids are DETECTED. The SARS-CoV-2 RNA is generally detectable in upper and lower  respiratory specimens dur ing the acute phase of infection.  Positive  results are  indicative of active infection with SARS-CoV-2.  Clinical  correlation with patient history and other diagnostic information is  necessary to determine patient infection status.  Positive results do  not rule out bacterial infection or co-infection with other viruses. If result is PRESUMPTIVE POSTIVE SARS-CoV-2 nucleic acids MAY BE PRESENT.   A presumptive positive result was obtained on the submitted specimen  and confirmed on repeat testing.  While 2019 novel coronavirus  (SARS-CoV-2) nucleic acids may be present in the submitted sample  additional confirmatory testing may be necessary for epidemiological  and / or clinical management purposes  to differentiate between  SARS-CoV-2  and other Sarbecovirus currently known to infect humans.  If clinically indicated additional testing with an alternate test  methodology [phone]) is advised. The SARS-CoV-2 RNA is generally  detectable in upper and lower respiratory sp ecimens during the acute  phase of infection. The expected result is Negative. Fact Sheet for Patients:  StrictlyIdeas.no Fact Sheet for Healthcare Providers: BankingDealers.co.za This test is not yet approved or cleared by the [place] FDA and has been authorized for detection and/or diagnosis of SARS-CoV-2 by FDA under an Emergency Use Authorization (EUA).  This EUA will remain in effect (meaning this test can be used) for the duration of the COVID-19 declaration under Section 564(b)(1) of the Act, 21 U.S.C. section 360bbb-3(b)(1), unless the authorization is terminated or revoked sooner. Performed at [facility], [address] [place], [place] [address]   HIV antibody (Routine Testing)     Status: None   Collection Time: 03/10/19  5:10 AM  Result Value Ref Range   HIV Screen 4th Generation wRfx Non Reactive Non Reactive    Comment: (NOTE) Performed At: [facility] [place], [place] [id number] Rush Farmer MD [id number]   Hemoglobin A1c     Status: Abnormal   Collection Time: 03/10/19  5:10 AM  Result Value Ref Range   Hgb A1c MFr Bld 6.3 (H) 4.8 - 5.6 %    Comment: (NOTE) Pre diabetes:          5.7%-6.4% Diabetes:              >6.4% Glycemic control for   <7.0% adults with diabetes    Mean Plasma Glucose 134.11 mg/dL    Comment: Performed at [place] [address] [place], [place] [address]  Lipid panel     Status: Abnormal   Collection Time: 03/10/19  5:10 AM  Result Value Ref Range   Cholesterol 189 0 - 200 mg/dL   Triglycerides 110 <150 mg/dL   HDL 35 (L) >40 mg/dL   Total CHOL/HDL Ratio 5.4 RATIO   VLDL 22 0 - 40 mg/dL   LDL Cholesterol  132 (H) 0 - 99 mg/dL    Comment:        Total Cholesterol/HDL:CHD Risk Coronary Heart Disease Risk Table                     Men   Women  1/2 Average Risk   3.4   3.3  Average Risk       5.0   4.4  2 X Average Risk   9.6   7.1  3 X Average Risk  23.4   11.0        Use the calculated Patient Ratio above and the CHD Risk Table to determine the patient's CHD Risk.        ATP III CLASSIFICATION (LDL):  <100  mg/dL   Optimal  100-129  mg/dL   Near or Above                    Optimal  130-159  mg/dL   Borderline  160-189  mg/dL   High  >190     mg/dL   Very High Performed at [address] [place], [place] [address]   ECHOCARDIOGRAM COMPLETE     Status: None   Collection Time: 03/10/19 11:48 AM  Result Value Ref Range   BP 141/95 mmHg    Assessment & Plan:   1.  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  Moderate severe hypertension- improving  4.  Type 2 diabetes -Recent A1c.  He is advised to continue metformin 500 mg p.o. twice daily.  - Based on review of his recent medical records, he has benefited from left adrenalectomy for primary hyperaldosteronism which was causing severe hypertension and hypokalemia.   He is advised to continue on his current 3 medications including Benicar, carvedilol, and clonidine patch. -He is advised to continue to monitor blood pressure at home and call back if he measures greater than 140/90 or less than 100/60.  -Without any potassium supplement for 72-month his recent labs show potassium of 3.5 mmol/L.  -His type 2 diabetes well controlled with A1c of 6.3%, advised to continue metformin 500 mg p.o. twice daily.    - I advised him  to maintain close follow up with Abran Richard, MD for primary care needs. Time for this visit 15 minutes.  [name] participated in the discussions, expressed understanding, and voiced agreement with the above plans.  All questions  were answered to his satisfaction. he is encouraged to contact clinic should he have any questions or concerns prior to his return visit.  Follow up plan: Return for Follow up with Pre-visit Labs.   Glade Lloyd, MD [facility] [address] [place], [place] [address] Phone: [phone]  Fax: [phone]     03/14/2019, 5:43 PM  This note was partially dictated with voice recognition software. Similar sounding words can be transcribed inadequately or may not  be corrected upon review.

## 2019-03-15 ENCOUNTER — Ambulatory Visit: Payer: Medicare Other | Admitting: "Endocrinology

## 2019-03-15 ENCOUNTER — Ambulatory Visit
Admission: RE | Admit: 2019-03-15 | Discharge: 2019-03-15 | Disposition: A | Discharge status: Home / Self Care | Payer: Medicare Other | Source: Ambulatory Visit | Attending: Surgery | Admitting: Surgery

## 2019-03-15 MED ORDER — IOPAMIDOL (ISOVUE-300) INJECTION 61%
100.0000 mL | Freq: Once | INTRAVENOUS | Status: AC | PRN
  Administered 2019-03-15: 100 mL via INTRAVENOUS

## 2019-06-14 ENCOUNTER — Ambulatory Visit: Payer: Medicare Other | Admitting: "Endocrinology

## 2019-06-27 ENCOUNTER — Ambulatory Visit: Payer: Medicare Other | Admitting: "Endocrinology

## 2019-11-15 ENCOUNTER — Other Ambulatory Visit: Payer: Self-pay | Admitting: Student

## 2019-11-15 ENCOUNTER — Other Ambulatory Visit (HOSPITAL_COMMUNITY): Payer: Self-pay | Admitting: Student

## 2019-11-15 DIAGNOSIS — N1832 Chronic kidney disease, stage 3b: Secondary | ICD-10-CM

## 2019-11-21 ENCOUNTER — Encounter: Payer: Self-pay | Admitting: *Deleted

## 2019-11-23 ENCOUNTER — Encounter (HOSPITAL_COMMUNITY): Payer: Self-pay

## 2019-11-23 ENCOUNTER — Ambulatory Visit (HOSPITAL_COMMUNITY): Admission: RE | Admit: 2019-11-23 | Payer: Medicare Other | Source: Ambulatory Visit

## 2019-11-27 ENCOUNTER — Ambulatory Visit (INDEPENDENT_AMBULATORY_CARE_PROVIDER_SITE_OTHER): Payer: Medicare Other | Admitting: General Surgery

## 2019-11-27 ENCOUNTER — Other Ambulatory Visit: Payer: Self-pay

## 2019-11-27 ENCOUNTER — Encounter: Payer: Self-pay | Admitting: General Surgery

## 2019-11-27 VITALS — BP 114/70 | HR 75 | Temp 98.3°F | Resp 16 | Ht 69.0 in | Wt 181.0 lb

## 2019-11-27 DIAGNOSIS — K4090 Unilateral inguinal hernia, without obstruction or gangrene, not specified as recurrent: Secondary | ICD-10-CM

## 2019-11-27 NOTE — Patient Instructions (Signed)

## 2019-11-27 NOTE — Progress Notes (Signed)
Cody Shepard; [id number]; May 27, 1966   HPI Patient is a 54 year old black male who was referred to my care by Dr. Yong Channel for evaluation treatment of a left inguinal hernia.  He states he noticed a lump in his left groin approximately 4 weeks ago.  It is now persistently sticking out and he has to push it back in.  Is made worse with straining.  He currently has a pain level of 3 out of 10.  No nausea or vomiting have been noted.  It does make his activity limited when it is sticking out. Past Medical History:  Diagnosis Date  . Asthma   . Bronchitis   . COPD (chronic obstructive pulmonary disease) ([place])   . Coronary artery disease   . Diabetes mellitus without complication ([place])   . Headache   . High cholesterol   . Hypertension     Past Surgical History:  Procedure Laterality Date  . LAPAROSCOPIC ADRENALECTOMY Left 01/04/2019   Procedure: LAPAROSCOPIC LEFT ADRENALECTOMY;  Surgeon: Armandina Gemma, MD;  Location: [facility];  Service: General;  Laterality: Left;  . NO PAST SURGERIES      Family History  Problem Relation Age of Onset  . Diabetes Mother   . Hypertension Mother   . Stroke Father     Current Outpatient Medications on File Prior to Visit  Medication Sig Dispense Refill  . albuterol (PROVENTIL HFA;VENTOLIN HFA) 108 (90 BASE) MCG/ACT inhaler Inhale 2 puffs into the lungs every 4 (four) hours as needed for wheezing or shortness of breath.     . Ascorbic Acid (VITAMIN C) 1000 MG tablet Take 1,000 mg by mouth daily.    Marland Kitchen atorvastatin (LIPITOR) 20 MG tablet Take 20 mg by mouth at bedtime.     . carvedilol (COREG) 25 MG tablet Take 25 mg by mouth 2 (two) times daily with a meal.    . cloNIDine (CATAPRES - DOSED IN MG/24 HR) 0.3 mg/24hr patch Place 0.3 mg onto the skin every Sunday.     . fluticasone (FLONASE) 50 MCG/ACT nasal spray Place 2 sprays into both nostrils 2 (two) times a day.     . Fluticasone-Umeclidin-Vilant (TRELEGY ELLIPTA) 100-62.5-25 MCG/INH AEPB Inhale 1 Inhaler  into the lungs daily.    . metFORMIN (GLUCOPHAGE) 1000 MG tablet Take 500 mg by mouth 2 (two) times daily.     Marland Kitchen olmesartan (BENICAR) 20 MG tablet Take 20 mg by mouth daily.    . ranolazine (RANEXA) 500 MG 12 hr tablet Take 500 mg by mouth 2 (two) times daily.     No current facility-administered medications on file prior to visit.    No Known Allergies  Social History   Substance and Sexual Activity  Alcohol Use No    Social History   Tobacco Use  Smoking Status Former Smoker  . Packs/day: 1.00  . Types: Cigarettes  . Quit date: 08/30/1997  . Years since quitting: 22.2  Smokeless Tobacco Never Used    Review of Systems  Constitutional: Positive for malaise/fatigue.  HENT: Negative.   Eyes: Positive for pain.  Respiratory: Positive for shortness of breath.   Cardiovascular: Negative.   Gastrointestinal: Negative.   Genitourinary: Positive for frequency and urgency.  Musculoskeletal: Negative.   Skin: Negative.   Neurological: Negative.   Endo/Heme/Allergies: Negative.   Psychiatric/Behavioral: Negative.     Objective   Vitals:   11/27/19 1339  BP: 114/70  Pulse: 75  Resp: 16  Temp: 98.3 F (36.8 C)  SpO2: 98%  Physical Exam Vitals reviewed.  Constitutional:      Appearance: Normal appearance. He is normal weight. He is not ill-appearing.  HENT:     Head: Normocephalic and atraumatic.  Cardiovascular:     Rate and Rhythm: Normal rate and regular rhythm.     Heart sounds: Normal heart sounds. No murmur. No friction rub. No gallop.   Pulmonary:     Effort: Pulmonary effort is normal. No respiratory distress.     Breath sounds: Normal breath sounds. No stridor. No wheezing, rhonchi or rales.  Abdominal:     General: Abdomen is flat. There is no distension.     Palpations: Abdomen is soft. There is no mass.     Tenderness: There is no abdominal tenderness. There is no guarding or rebound.     Hernia: A hernia is present.     Comments: Easily  reducible left inguinal hernia.  No right inguinal hernia appreciated.  Genitourinary:    Comments: Genitourinary examination is within normal limits Skin:    General: Skin is warm and dry.  Neurological:     Mental Status: He is alert and oriented to person, place, and time.    Primary care notes reviewed.  Endocrinology notes reviewed Assessment  Left inguinal hernia Plan   Patient will call to schedule left inguinal herniorrhaphy with mesh.  The risks and benefits of the procedure including bleeding, infection, mesh use, and the possibility of recurrence of the hernia were fully explained to the patient, who gave informed consent.

## 2019-12-20 ENCOUNTER — Telehealth: Payer: Self-pay

## 2019-12-20 NOTE — Telephone Encounter (Signed)
Patient requested Wednesday for surgery. Patient notified of the date /times below.  Surgery 01/16/20 @ 8:30 am Pre-op-01/14/20 @ 11:00 am Covid test-01/14/20 @ 12pm  Sister verified all information. Homebound after having covid.

## 2019-12-26 NOTE — H&P (Signed)
Cody Shepard; [id number]; 01-20-1966   HPI Patient is a 54 year old black male who was referred to my care by Dr. Yong Channel for evaluation treatment of a left inguinal hernia.  He states he noticed a lump in his left groin approximately 4 weeks ago.  It is now persistently sticking out and he has to push it back in.  Is made worse with straining.  He currently has a pain level of 3 out of 10.  No nausea or vomiting have been noted.  It does make his activity limited when it is sticking out. Past Medical History:  Diagnosis Date  . Asthma   . Bronchitis   . COPD (chronic obstructive pulmonary disease) ([place])   . Coronary artery disease   . Diabetes mellitus without complication ([place])   . Headache   . High cholesterol   . Hypertension     Past Surgical History:  Procedure Laterality Date  . LAPAROSCOPIC ADRENALECTOMY Left 01/04/2019   Procedure: LAPAROSCOPIC LEFT ADRENALECTOMY;  Surgeon: Armandina Gemma, MD;  Location: [facility];  Service: General;  Laterality: Left;  . NO PAST SURGERIES      Family History  Problem Relation Age of Onset  . Diabetes Mother   . Hypertension Mother   . Stroke Father     Current Outpatient Medications on File Prior to Visit  Medication Sig Dispense Refill  . albuterol (PROVENTIL HFA;VENTOLIN HFA) 108 (90 BASE) MCG/ACT inhaler Inhale 2 puffs into the lungs every 4 (four) hours as needed for wheezing or shortness of breath.     . Ascorbic Acid (VITAMIN C) 1000 MG tablet Take 1,000 mg by mouth daily.    Marland Kitchen atorvastatin (LIPITOR) 20 MG tablet Take 20 mg by mouth at bedtime.     . carvedilol (COREG) 25 MG tablet Take 25 mg by mouth 2 (two) times daily with a meal.    . cloNIDine (CATAPRES - DOSED IN MG/24 HR) 0.3 mg/24hr patch Place 0.3 mg onto the skin every Sunday.     . fluticasone (FLONASE) 50 MCG/ACT nasal spray Place 2 sprays into both nostrils 2 (two) times a day.     . Fluticasone-Umeclidin-Vilant (TRELEGY ELLIPTA) 100-62.5-25 MCG/INH AEPB Inhale 1 Inhaler  into the lungs daily.    . metFORMIN (GLUCOPHAGE) 1000 MG tablet Take 500 mg by mouth 2 (two) times daily.     Marland Kitchen olmesartan (BENICAR) 20 MG tablet Take 20 mg by mouth daily.    . ranolazine (RANEXA) 500 MG 12 hr tablet Take 500 mg by mouth 2 (two) times daily.     No current facility-administered medications on file prior to visit.    No Known Allergies  Social History   Substance and Sexual Activity  Alcohol Use No    Social History   Tobacco Use  Smoking Status Former Smoker  . Packs/day: 1.00  . Types: Cigarettes  . Quit date: 08/30/1997  . Years since quitting: 22.2  Smokeless Tobacco Never Used    Review of Systems  Constitutional: Positive for malaise/fatigue.  HENT: Negative.   Eyes: Positive for pain.  Respiratory: Positive for shortness of breath.   Cardiovascular: Negative.   Gastrointestinal: Negative.   Genitourinary: Positive for frequency and urgency.  Musculoskeletal: Negative.   Skin: Negative.   Neurological: Negative.   Endo/Heme/Allergies: Negative.   Psychiatric/Behavioral: Negative.     Objective   Vitals:   11/27/19 1339  BP: 114/70  Pulse: 75  Resp: 16  Temp: 98.3 F (36.8 C)  SpO2: 98%  Physical Exam Vitals reviewed.  Constitutional:      Appearance: Normal appearance. He is normal weight. He is not ill-appearing.  HENT:     Head: Normocephalic and atraumatic.  Cardiovascular:     Rate and Rhythm: Normal rate and regular rhythm.     Heart sounds: Normal heart sounds. No murmur. No friction rub. No gallop.   Pulmonary:     Effort: Pulmonary effort is normal. No respiratory distress.     Breath sounds: Normal breath sounds. No stridor. No wheezing, rhonchi or rales.  Abdominal:     General: Abdomen is flat. There is no distension.     Palpations: Abdomen is soft. There is no mass.     Tenderness: There is no abdominal tenderness. There is no guarding or rebound.     Hernia: A hernia is present.     Comments: Easily  reducible left inguinal hernia.  No right inguinal hernia appreciated.  Genitourinary:    Comments: Genitourinary examination is within normal limits Skin:    General: Skin is warm and dry.  Neurological:     Mental Status: He is alert and oriented to person, place, and time.    Primary care notes reviewed.  Endocrinology notes reviewed Assessment  Left inguinal hernia Plan   Patient will call to schedule left inguinal herniorrhaphy with mesh.  The risks and benefits of the procedure including bleeding, infection, mesh use, and the possibility of recurrence of the hernia were fully explained to the patient, who gave informed consent.

## 2020-01-03 ENCOUNTER — Encounter (HOSPITAL_COMMUNITY): Payer: Medicare Other

## 2020-01-03 ENCOUNTER — Other Ambulatory Visit (HOSPITAL_COMMUNITY): Payer: Medicare Other

## 2020-01-09 NOTE — Patient Instructions (Signed)
Your procedure is scheduled on: 01/16/20  Report to [facility] at  6:45   AM.  Call this number if you have problems the morning of surgery: [phone]   Remember:   Do not Eat or Drink after midnight         No Smoking the morning of surgery  :  Take these medicines the morning of surgery with A SIP OF WATER: Carvelilol, Flonase, hydralazine and ranexa    Use inhalers and tramadol if needed   Do not wear jewelry, make-up or nail polish.  Do not wear lotions, powders, or perfumes. You may wear deodorant.  Do not shave 48 hours prior to surgery. Men may shave face and neck.  Do not bring valuables to the hospital.  Contacts, dentures or bridgework may not be worn into surgery.  Leave suitcase in the car. After surgery it may be brought to your room.  For patients admitted to the hospital, checkout time is 11:00 AM the day of discharge.   Patients discharged the day of surgery will not be allowed to drive home.    Special Instructions: Shower using CHG night before surgery and shower the day of surgery use CHG.  Use special wash - you have one bottle of CHG for all showers.  You should use approximately 1/2 of the bottle for each shower.  Inguinal Hernia, Adult An inguinal hernia is when fat or your intestines push through a weak spot in a muscle where your leg meets your lower belly (groin). This causes a rounded lump (bulge). This kind of hernia could also be:  In your scrotum, if you are male.  In folds of skin around your vagina, if you are male. There are three types of inguinal hernias. These include:  Hernias that can be pushed back into the belly (are reducible). This type rarely causes pain.  Hernias that cannot be pushed back into the belly (are incarcerated).  Hernias that cannot be pushed back into the belly and lose their blood supply (are strangulated). This type needs emergency surgery. If you do not have symptoms, you may not need treatment. If you have  symptoms or a large hernia, you may need surgery. Follow these instructions at home: Lifestyle  Do these things if told by your doctor so you do not have trouble pooping (constipation): ? Drink enough fluid to keep your pee (urine) pale yellow. ? Eat foods that have a lot of fiber. These include fresh fruits and vegetables, whole grains, and beans. ? Limit foods that are high in fat and processed sugars. These include foods that are fried or sweet. ? Take medicine for trouble pooping.  Avoid lifting heavy objects.  Avoid standing for long amounts of time.  Do not use any products that contain nicotine or tobacco. These include cigarettes and e-cigarettes. If you need help quitting, ask your doctor.  Stay at a healthy weight. General instructions  You may try to push your hernia in by very gently pressing on it when you are lying down. Do not try to force the bulge back in if it will not push in easily.  Watch your hernia for any changes in shape, size, or color. Tell your doctor if you see any changes.  Take over-the-counter and prescription medicines only as told by your doctor.  Keep all follow-up visits as told by your doctor. This is important. Contact a doctor if:  You have a fever.  You have new symptoms.  Your symptoms  get worse. Get help right away if:  The area where your leg meets your lower belly has: ? Pain that gets worse suddenly. ? A bulge that gets bigger suddenly, and it does not get smaller after that. ? A bulge that turns red or purple. ? A bulge that is painful when you touch it.  You are a man, and your scrotum: ? Suddenly feels painful. ? Suddenly changes in size.  You cannot push the hernia in by very gently pressing on it when you are lying down. Do not try to force the bulge back in if it will not push in easily.  You feel sick to your stomach (nauseous), and that feeling does not go away.  You throw up (vomit), and that keeps  happening.  You have a fast heartbeat.  You cannot poop (have a bowel movement) or pass gas. These symptoms may be an emergency. Do not wait to see if the symptoms will go away. Get medical help right away. Call your local emergency services (911 in the [place] Summary  An inguinal hernia is when fat or your intestines push through a weak spot in a muscle where your leg meets your lower belly (groin). This causes a rounded lump (bulge).  If you do not have symptoms, you may not need treatment. If you have symptoms or a large hernia, you may need surgery.  Avoid lifting heavy objects. Also avoid standing for long amounts of time.  Do not try to force the bulge back in if it will not push in easily. This information is not intended to replace advice given to you by your health care provider. Make sure you discuss any questions you have with your health care provider. Document Revised: 11/12/2017 Document Reviewed: 07/13/2017 Elsevier Patient Education  [place] [employer], Adult, Care After These instructions give you information about caring for yourself after your procedure. Your doctor may also give you more specific instructions. If you have problems or questions, contact your doctor. Follow these instructions at home: Surgical cut (incision) care   Follow instructions from your doctor about how to take care of your surgical cut area. Make sure you: ? Wash your hands with soap and water before you change your bandage (dressing). If you cannot use soap and water, use hand sanitizer. ? Change your bandage as told by your doctor. ? Leave stitches (sutures), skin glue, or skin tape (adhesive) strips in place. They may need to stay in place for 2 weeks or longer. If tape strips get loose and curl up, you may trim the loose edges. Do not remove tape strips completely unless your doctor says it is okay.  Check your surgical cut every day for signs of infection. Check  for: ? More redness, swelling, or pain. ? More fluid or blood. ? Warmth. ? Pus or a bad smell. Activity  Do not drive or use heavy machinery while taking prescription pain medicine. Do not drive until your doctor says it is okay.  Until your doctor says it is okay: ? Do not lift anything that is heavier than 10 lb (4.5 kg). ? Do not play contact sports.  Return to your normal activities as told by your doctor. Ask your doctor what activities are safe. General instructions  To prevent or treat having a hard time pooping (constipation) while you are taking prescription pain medicine, your doctor may recommend that you: ? Drink enough fluid to keep your pee (urine) clear or  pale yellow. ? Take over-the-counter or prescription medicines. ? Eat foods that are high in fiber, such as fresh fruits and vegetables, whole grains, and beans. ? Limit foods that are high in fat and processed sugars, such as fried and sweet foods.  Take over-the-counter and prescription medicines only as told by your doctor.  Do not take baths, swim, or use a hot tub until your doctor says it is okay.  Keep all follow-up visits as told by your doctor. This is important. Contact a doctor if:  You develop a rash.  You have more redness, swelling, or pain around your surgical cut.  You have more fluid or blood coming from your surgical cut.  Your surgical cut feels warm to the touch.  You have pus or a bad smell coming from your surgical cut.  You have a fever or chills.  You have blood in your poop (stool).  You have not pooped in 2-3 days.  Medicine does not help your pain. Get help right away if:  You have chest pain or you are short of breath.  You feel light-headed.  You feel weak and dizzy (feel faint).  You have very bad pain.  You throw up (vomit) and your pain is worse. This information is not intended to replace advice given to you by your health care provider. Make sure you discuss  any questions you have with your health care provider. Document Revised: 02/02/2019 Document Reviewed: 03/24/2016 Elsevier Patient Education  2020 Gas Anesthesia, Adult, Care After This sheet gives you information about how to care for yourself after your procedure. Your health care provider may also give you more specific instructions. If you have problems or questions, contact your health care provider. What can I expect after the procedure? After the procedure, the following side effects are common:  Pain or discomfort at the IV site.  Nausea.  Vomiting.  Sore throat.  Trouble concentrating.  Feeling cold or chills.  Weak or tired.  Sleepiness and fatigue.  Soreness and body aches. These side effects can affect parts of the body that were not involved in surgery. Follow these instructions at home:  For at least 24 hours after the procedure:  Have a responsible adult stay with you. It is important to have someone help care for you until you are awake and alert.  Rest as needed.  Do not: ? Participate in activities in which you could fall or become injured. ? Drive. ? Use heavy machinery. ? Drink alcohol. ? Take sleeping pills or medicines that cause drowsiness. ? Make important decisions or sign legal documents. ? Take care of children on your own. Eating and drinking  Follow any instructions from your health care provider about eating or drinking restrictions.  When you feel hungry, start by eating small amounts of foods that are soft and easy to digest (bland), such as toast. Gradually return to your regular diet.  Drink enough fluid to keep your urine pale yellow.  If you vomit, rehydrate by drinking water, juice, or clear broth. General instructions  If you have sleep apnea, surgery and certain medicines can increase your risk for breathing problems. Follow instructions from your health care provider about wearing your sleep  device: ? Anytime you are sleeping, including during daytime naps. ? While taking prescription pain medicines, sleeping medicines, or medicines that make you drowsy.  Return to your normal activities as told by your health care provider. Ask your health care provider what  activities are safe for you.  Take over-the-counter and prescription medicines only as told by your health care provider.  If you smoke, do not smoke without supervision.  Keep all follow-up visits as told by your health care provider. This is important. Contact a health care provider if:  You have nausea or vomiting that does not get better with medicine.  You cannot eat or drink without vomiting.  You have pain that does not get better with medicine.  You are unable to pass urine.  You develop a skin rash.  You have a fever.  You have redness around your IV site that gets worse. Get help right away if:  You have difficulty breathing.  You have chest pain.  You have blood in your urine or stool, or you vomit blood. Summary  After the procedure, it is common to have a sore throat or nausea. It is also common to feel tired.  Have a responsible adult stay with you for the first 24 hours after general anesthesia. It is important to have someone help care for you until you are awake and alert.  When you feel hungry, start by eating small amounts of foods that are soft and easy to digest (bland), such as toast. Gradually return to your regular diet.  Drink enough fluid to keep your urine pale yellow.  Return to your normal activities as told by your health care provider. Ask your health care provider what activities are safe for you. This information is not intended to replace advice given to you by your health care provider. Make sure you discuss any questions you have with your health care provider. Document Revised: 10/14/2017 Document Reviewed: 05/27/2017 Elsevier Patient Education  [place].

## 2020-01-14 ENCOUNTER — Other Ambulatory Visit (HOSPITAL_COMMUNITY)
Admission: RE | Admit: 2020-01-14 | Discharge: 2020-01-14 | Disposition: A | Discharge status: Home / Self Care | Payer: Medicare Other | Source: Ambulatory Visit | Attending: General Surgery | Admitting: General Surgery

## 2020-01-14 ENCOUNTER — Other Ambulatory Visit: Payer: Self-pay

## 2020-01-14 ENCOUNTER — Encounter (HOSPITAL_COMMUNITY)
Admission: RE | Admit: 2020-01-14 | Discharge: 2020-01-14 | Disposition: A | Discharge status: Home / Self Care | Payer: Medicare Other | Source: Ambulatory Visit | Attending: General Surgery | Admitting: General Surgery

## 2020-01-14 ENCOUNTER — Encounter (HOSPITAL_COMMUNITY): Payer: Self-pay

## 2020-01-14 DIAGNOSIS — E7800 Pure hypercholesterolemia, unspecified: Secondary | ICD-10-CM | POA: Diagnosis not present

## 2020-01-14 DIAGNOSIS — Z823 Family history of stroke: Secondary | ICD-10-CM | POA: Diagnosis not present

## 2020-01-14 DIAGNOSIS — I2510 Atherosclerotic heart disease of native coronary artery without angina pectoris: Secondary | ICD-10-CM | POA: Diagnosis not present

## 2020-01-14 DIAGNOSIS — J449 Chronic obstructive pulmonary disease, unspecified: Secondary | ICD-10-CM | POA: Diagnosis not present

## 2020-01-14 DIAGNOSIS — K4090 Unilateral inguinal hernia, without obstruction or gangrene, not specified as recurrent: Secondary | ICD-10-CM | POA: Diagnosis not present

## 2020-01-14 DIAGNOSIS — I10 Essential (primary) hypertension: Secondary | ICD-10-CM | POA: Diagnosis not present

## 2020-01-14 DIAGNOSIS — Z01812 Encounter for preprocedural laboratory examination: Secondary | ICD-10-CM | POA: Insufficient documentation

## 2020-01-14 DIAGNOSIS — Z833 Family history of diabetes mellitus: Secondary | ICD-10-CM | POA: Diagnosis not present

## 2020-01-14 DIAGNOSIS — E119 Type 2 diabetes mellitus without complications: Secondary | ICD-10-CM | POA: Diagnosis not present

## 2020-01-14 DIAGNOSIS — Z20822 Contact with and (suspected) exposure to covid-19: Secondary | ICD-10-CM | POA: Insufficient documentation

## 2020-01-14 DIAGNOSIS — R519 Headache, unspecified: Secondary | ICD-10-CM | POA: Diagnosis not present

## 2020-01-14 DIAGNOSIS — Z79899 Other long term (current) drug therapy: Secondary | ICD-10-CM | POA: Diagnosis not present

## 2020-01-14 DIAGNOSIS — Z7984 Long term (current) use of oral hypoglycemic drugs: Secondary | ICD-10-CM | POA: Diagnosis not present

## 2020-01-14 DIAGNOSIS — Z87891 Personal history of nicotine dependence: Secondary | ICD-10-CM | POA: Diagnosis not present

## 2020-01-14 DIAGNOSIS — Z8249 Family history of ischemic heart disease and other diseases of the circulatory system: Secondary | ICD-10-CM | POA: Diagnosis not present

## 2020-01-14 DIAGNOSIS — D176 Benign lipomatous neoplasm of spermatic cord: Secondary | ICD-10-CM | POA: Diagnosis not present

## 2020-01-14 LAB — BASIC METABOLIC PANEL
Anion gap: 10 (ref 5–15)
BUN: 22 mg/dL — ABNORMAL HIGH (ref 6–20)
CO2: 26 mmol/L (ref 22–32)
Calcium: 9.5 mg/dL (ref 8.9–10.3)
Chloride: 100 mmol/L (ref 98–111)
Creatinine, Ser: 2.03 mg/dL — ABNORMAL HIGH (ref 0.61–1.24)
GFR calc Af Amer: 41 mL/min — ABNORMAL LOW (ref >60)
GFR calc non Af Amer: 36 mL/min — ABNORMAL LOW (ref >60)
Glucose, Bld: 154 mg/dL — ABNORMAL HIGH (ref 70–99)
Potassium: 4.2 mmol/L (ref 3.5–5.1)
Sodium: 136 mmol/L (ref 135–145)

## 2020-01-14 LAB — CBC
HCT: 35.6 % — ABNORMAL LOW (ref 39.0–52.0)
Hemoglobin: 11.7 g/dL — ABNORMAL LOW (ref 13.0–17.0)
MCH: 32.2 pg (ref 26.0–34.0)
MCHC: 32.9 g/dL (ref 30.0–36.0)
MCV: 98.1 fL (ref 80.0–100.0)
Platelets: 206 10*3/uL (ref 150–400)
RBC: 3.63 MIL/uL — ABNORMAL LOW (ref 4.22–5.81)
RDW: 12.5 % (ref 11.5–15.5)
WBC: 4.6 10*3/uL (ref 4.0–10.5)
nRBC: 0 % (ref 0.0–0.2)

## 2020-01-14 LAB — SARS CORONAVIRUS 2 (TAT 6-24 HRS): SARS Coronavirus 2: NEGATIVE

## 2020-01-14 LAB — HEMOGLOBIN A1C
Hgb A1c MFr Bld: 6.3 % — ABNORMAL HIGH (ref 4.8–5.6)
Mean Plasma Glucose: 134.11 mg/dL

## 2020-01-15 NOTE — Pre-Procedure Instructions (Signed)
HgbA1C routed to PCP. 

## 2020-01-16 ENCOUNTER — Ambulatory Visit (HOSPITAL_COMMUNITY): Payer: Medicare Other | Admitting: Anesthesiology

## 2020-01-16 ENCOUNTER — Encounter (HOSPITAL_COMMUNITY)
Admission: RE | Disposition: A | Discharge status: Home / Self Care | Payer: Self-pay | Source: Home / Self Care | Attending: General Surgery

## 2020-01-16 ENCOUNTER — Ambulatory Visit (HOSPITAL_COMMUNITY)
Admission: RE | Admit: 2020-01-16 | Discharge: 2020-01-16 | Disposition: A | Discharge status: Home / Self Care | Payer: Medicare Other | Attending: General Surgery | Admitting: General Surgery

## 2020-01-16 ENCOUNTER — Encounter (HOSPITAL_COMMUNITY): Payer: Self-pay | Admitting: General Surgery

## 2020-01-16 DIAGNOSIS — D176 Benign lipomatous neoplasm of spermatic cord: Secondary | ICD-10-CM | POA: Insufficient documentation

## 2020-01-16 DIAGNOSIS — J449 Chronic obstructive pulmonary disease, unspecified: Secondary | ICD-10-CM | POA: Insufficient documentation

## 2020-01-16 DIAGNOSIS — K4090 Unilateral inguinal hernia, without obstruction or gangrene, not specified as recurrent: Secondary | ICD-10-CM

## 2020-01-16 DIAGNOSIS — Z20822 Contact with and (suspected) exposure to covid-19: Secondary | ICD-10-CM | POA: Insufficient documentation

## 2020-01-16 DIAGNOSIS — E7800 Pure hypercholesterolemia, unspecified: Secondary | ICD-10-CM | POA: Insufficient documentation

## 2020-01-16 DIAGNOSIS — I10 Essential (primary) hypertension: Secondary | ICD-10-CM | POA: Insufficient documentation

## 2020-01-16 DIAGNOSIS — E119 Type 2 diabetes mellitus without complications: Secondary | ICD-10-CM | POA: Insufficient documentation

## 2020-01-16 DIAGNOSIS — Z87891 Personal history of nicotine dependence: Secondary | ICD-10-CM | POA: Insufficient documentation

## 2020-01-16 DIAGNOSIS — Z79899 Other long term (current) drug therapy: Secondary | ICD-10-CM | POA: Insufficient documentation

## 2020-01-16 DIAGNOSIS — R519 Headache, unspecified: Secondary | ICD-10-CM | POA: Insufficient documentation

## 2020-01-16 DIAGNOSIS — Z7984 Long term (current) use of oral hypoglycemic drugs: Secondary | ICD-10-CM | POA: Insufficient documentation

## 2020-01-16 DIAGNOSIS — Z8249 Family history of ischemic heart disease and other diseases of the circulatory system: Secondary | ICD-10-CM | POA: Insufficient documentation

## 2020-01-16 DIAGNOSIS — Z833 Family history of diabetes mellitus: Secondary | ICD-10-CM | POA: Insufficient documentation

## 2020-01-16 DIAGNOSIS — I2510 Atherosclerotic heart disease of native coronary artery without angina pectoris: Secondary | ICD-10-CM | POA: Insufficient documentation

## 2020-01-16 DIAGNOSIS — Z823 Family history of stroke: Secondary | ICD-10-CM | POA: Insufficient documentation

## 2020-01-16 HISTORY — PX: INGUINAL HERNIA REPAIR: SHX194

## 2020-01-16 LAB — GLUCOSE, CAPILLARY: Glucose-Capillary: 88 mg/dL (ref 70–99)

## 2020-01-16 SURGERY — REPAIR, HERNIA, INGUINAL, ADULT
Anesthesia: General | Site: Groin | Laterality: Left

## 2020-01-16 MED ORDER — ONDANSETRON HCL 4 MG/2ML IJ SOLN
INTRAMUSCULAR | Status: DC | PRN
  Administered 2020-01-16: 4 mg via INTRAVENOUS

## 2020-01-16 MED ORDER — CHLORHEXIDINE GLUCONATE CLOTH 2 % EX PADS: 6.0000 | MEDICATED_PAD | Freq: Once | CUTANEOUS | Status: DC

## 2020-01-16 MED ORDER — LIDOCAINE 2% (20 MG/ML) 5 ML SYRINGE
INTRAMUSCULAR | Status: AC
  Filled 2020-01-16: qty 5

## 2020-01-16 MED ORDER — DEXAMETHASONE SODIUM PHOSPHATE 10 MG/ML IJ SOLN
INTRAMUSCULAR | Status: DC | PRN
  Administered 2020-01-16: 4 mg via INTRAVENOUS

## 2020-01-16 MED ORDER — EPHEDRINE 5 MG/ML INJ
INTRAVENOUS | Status: AC
  Filled 2020-01-16: qty 10

## 2020-01-16 MED ORDER — MIDAZOLAM HCL 2 MG/2ML IJ SOLN
INTRAMUSCULAR | Status: AC
  Filled 2020-01-16: qty 2

## 2020-01-16 MED ORDER — FENTANYL CITRATE (PF) 100 MCG/2ML IJ SOLN
INTRAMUSCULAR | Status: AC
  Filled 2020-01-16: qty 2

## 2020-01-16 MED ORDER — ONDANSETRON HCL 4 MG/2ML IJ SOLN
INTRAMUSCULAR | Status: AC
  Filled 2020-01-16: qty 2

## 2020-01-16 MED ORDER — SODIUM CHLORIDE 0.9 % IR SOLN
Status: DC | PRN
  Administered 2020-01-16: 1

## 2020-01-16 MED ORDER — PROPOFOL 10 MG/ML IV BOLUS
INTRAVENOUS | Status: AC
  Filled 2020-01-16: qty 40

## 2020-01-16 MED ORDER — CEFAZOLIN SODIUM-DEXTROSE 2-4 GM/100ML-% IV SOLN
INTRAVENOUS | Status: AC
  Filled 2020-01-16: qty 100

## 2020-01-16 MED ORDER — LIDOCAINE HCL (CARDIAC) PF 100 MG/5ML IV SOSY
PREFILLED_SYRINGE | INTRAVENOUS | Status: DC | PRN
  Administered 2020-01-16: 60 mg via INTRAVENOUS

## 2020-01-16 MED ORDER — CEFAZOLIN SODIUM-DEXTROSE 2-4 GM/100ML-% IV SOLN
2.0000 g | INTRAVENOUS | Status: AC
  Administered 2020-01-16: 08:00:00 2 g via INTRAVENOUS

## 2020-01-16 MED ORDER — BUPIVACAINE LIPOSOME 1.3 % IJ SUSP
INTRAMUSCULAR | Status: AC
  Filled 2020-01-16: qty 20

## 2020-01-16 MED ORDER — HYDROCODONE-ACETAMINOPHEN 5-325 MG PO TABS: 1.0000 | ORAL_TABLET | ORAL | 0 refills | Status: DC | PRN

## 2020-01-16 MED ORDER — FENTANYL CITRATE (PF) 100 MCG/2ML IJ SOLN
INTRAMUSCULAR | Status: DC | PRN
  Administered 2020-01-16: 100 ug via INTRAVENOUS

## 2020-01-16 MED ORDER — KETOROLAC TROMETHAMINE 30 MG/ML IJ SOLN
30.0000 mg | Freq: Once | INTRAMUSCULAR | Status: AC
  Administered 2020-01-16: 30 mg via INTRAVENOUS
  Filled 2020-01-16: qty 1

## 2020-01-16 MED ORDER — DEXAMETHASONE SODIUM PHOSPHATE 10 MG/ML IJ SOLN
INTRAMUSCULAR | Status: AC
  Filled 2020-01-16: qty 1

## 2020-01-16 MED ORDER — FENTANYL CITRATE (PF) 100 MCG/2ML IJ SOLN
25.0000 ug | INTRAMUSCULAR | Status: DC | PRN
  Administered 2020-01-16 (×3): 50 ug via INTRAVENOUS
  Filled 2020-01-16 (×2): qty 2

## 2020-01-16 MED ORDER — MIDAZOLAM HCL 5 MG/5ML IJ SOLN
INTRAMUSCULAR | Status: DC | PRN
  Administered 2020-01-16: 2 mg via INTRAVENOUS

## 2020-01-16 MED ORDER — EPHEDRINE SULFATE 50 MG/ML IJ SOLN
INTRAMUSCULAR | Status: DC | PRN
  Administered 2020-01-16 (×2): 10 mg via INTRAVENOUS

## 2020-01-16 MED ORDER — LACTATED RINGERS IV SOLN: INTRAVENOUS | Status: DC

## 2020-01-16 MED ORDER — PROPOFOL 10 MG/ML IV BOLUS
INTRAVENOUS | Status: DC | PRN
  Administered 2020-01-16: 200 mg via INTRAVENOUS

## 2020-01-16 MED ORDER — BUPIVACAINE LIPOSOME 1.3 % IJ SUSP
INTRAMUSCULAR | Status: DC | PRN
  Administered 2020-01-16: 20 mL

## 2020-01-16 SURGICAL SUPPLY — 41 items
ADH SKN CLS APL DERMABOND .7 (GAUZE/BANDAGES/DRESSINGS) ×2
CLOTH BEACON ORANGE TIMEOUT ST (SAFETY) ×3 IMPLANT
COVER LIGHT HANDLE STERIS (MISCELLANEOUS) ×6 IMPLANT
COVER WAND RF STERILE (DRAPES) ×3 IMPLANT
DERMABOND ADVANCED (GAUZE/BANDAGES/DRESSINGS) ×4
DERMABOND ADVANCED .7 DNX12 (GAUZE/BANDAGES/DRESSINGS) ×1 IMPLANT
DRAIN PENROSE 0.5X18 (DRAIN) ×3 IMPLANT
ELECT REM PT RETURN 9FT ADLT (ELECTROSURGICAL) ×3
ELECTRODE REM PT RTRN 9FT ADLT (ELECTROSURGICAL) ×1 IMPLANT
GAUZE SPONGE 4X4 12PLY STRL (GAUZE/BANDAGES/DRESSINGS) ×3 IMPLANT
GLOVE BIOGEL PI IND STRL 6.5 (GLOVE) IMPLANT
GLOVE BIOGEL PI IND STRL 7.0 (GLOVE) ×3 IMPLANT
GLOVE BIOGEL PI INDICATOR 6.5 (GLOVE) ×2
GLOVE BIOGEL PI INDICATOR 7.0 (GLOVE) ×4
GLOVE ECLIPSE 6.5 STRL STRAW (GLOVE) ×2 IMPLANT
GLOVE SURG SS PI 7.5 STRL IVOR (GLOVE) ×3 IMPLANT
GOWN STRL REUS W/TWL LRG LVL3 (GOWN DISPOSABLE) ×9 IMPLANT
INST SET MINOR GENERAL (KITS) ×3 IMPLANT
KIT TURNOVER KIT A (KITS) ×3 IMPLANT
MANIFOLD NEPTUNE II (INSTRUMENTS) ×3 IMPLANT
MESH HERNIA 1.6X1.9 PLUG LRG (Mesh General) IMPLANT
MESH HERNIA PLUG LRG (Mesh General) ×2 IMPLANT
NDL HYPO 18GX1.5 BLUNT FILL (NEEDLE) ×1 IMPLANT
NEEDLE HYPO 18GX1.5 BLUNT FILL (NEEDLE) ×3 IMPLANT
NEEDLE HYPO 22GX1.5 SAFETY (NEEDLE) ×3 IMPLANT
NS IRRIG 1000ML POUR BTL (IV SOLUTION) ×3 IMPLANT
PACK MINOR (CUSTOM PROCEDURE TRAY) ×3 IMPLANT
PAD ARMBOARD 7.5X6 YLW CONV (MISCELLANEOUS) ×3 IMPLANT
SET BASIN LINEN APH (SET/KITS/TRAYS/PACK) ×3 IMPLANT
SOL PREP PROV IODINE SCRUB 4OZ (MISCELLANEOUS) ×3 IMPLANT
SUT MNCRL AB 4-0 PS2 18 (SUTURE) ×3 IMPLANT
SUT NOVA NAB GS-22 2 2-0 T-19 (SUTURE) ×6 IMPLANT
SUT PROLENE 2 0 SH 30 (SUTURE) ×2 IMPLANT
SUT SILK 3 0 (SUTURE)
SUT SILK 3-0 18XBRD TIE 12 (SUTURE) IMPLANT
SUT VIC AB 2-0 CT1 27 (SUTURE) ×3
SUT VIC AB 2-0 CT1 TAPERPNT 27 (SUTURE) ×1 IMPLANT
SUT VIC AB 3-0 SH 27 (SUTURE) ×3
SUT VIC AB 3-0 SH 27X BRD (SUTURE) ×1 IMPLANT
SUT VICRYL AB 3 0 TIES (SUTURE) ×2 IMPLANT
SYR 20ML LL LF (SYRINGE) ×6 IMPLANT

## 2020-01-16 NOTE — Anesthesia Preprocedure Evaluation (Signed)
Anesthesia Evaluation  Patient identified by MRN, date of birth, ID band Patient awake    Reviewed: Allergy & Precautions, H&P , NPO status , Patient's Chart, lab work & pertinent test results, reviewed documented beta blocker date and time   Airway Mallampati: II  TM Distance: >3 FB     Dental  (+) Teeth Intact   Pulmonary asthma , COPD, former smoker,    Pulmonary exam normal        Cardiovascular hypertension, + CAD  Normal cardiovascular exam     Neuro/Psych  Headaches,    GI/Hepatic negative GI ROS, Neg liver ROS,   Endo/Other  diabetes, Poorly Controlled, Type 2, Oral Hypoglycemic Agents  Renal/GU negative Renal ROS     Musculoskeletal   Abdominal   Peds  Hematology negative hematology ROS (+)   Anesthesia Other Findings   Reproductive/Obstetrics                             Anesthesia Physical Anesthesia Plan  ASA: III  Anesthesia Plan: General   Post-op Pain Management:    Induction:   PONV Risk Score and Plan: 2 and Ondansetron  Airway Management Planned:   Additional Equipment:   Intra-op Plan:   Post-operative Plan:   Informed Consent: I have reviewed the patients History and Physical, chart, labs and discussed the procedure including the risks, benefits and alternatives for the proposed anesthesia with the patient or authorized representative who has indicated his/her understanding and acceptance.       Plan Discussed with: CRNA  Anesthesia Plan Comments:         Anesthesia Quick Evaluation

## 2020-01-16 NOTE — Op Note (Signed)
Patient:  Cody Shepard  DOB:  1966/03/12  MRN:  [id number]   Preop Diagnosis: Left inguinal hernia  Postop Diagnosis:  Same  Procedure:  Left inguinal herniorrhaphy with  mesh  Surgeon: Aviva Signs, MD  Anes: General  Indications: Patient is a 54 year old black male who presents with a symptomatic left inguinal hernia.  The risks and benefits of the procedure including bleeding, infection, mesh use, and the possibility of recurrence of the hernia were fully explained to the patient, who gave informed consent.  Procedure note: The patient was placed in the supine position.  After general anesthesia was administered, the left groin region was prepped and draped using the usual sterile technique with Betadine.  Surgical site confirmation was performed.  An incision was made in the left groin region down to the external oblique aponeurosis.  The aponeurosis was incised to the external ring.  A Penrose drain was placed around the spermatic cord.  The vase deferens was noted within the spermatic cord.  A lipoma of the cord was found and a high ligation was performed using a 3-0 Vicryl tie.  An indirect hernia sac was found.  This was freed away from the spermatic cord up to the peritoneal reflection.  It was opened in order to make sure there was no intra-abdominal contents within the hernia sac.  A pursestring 2-0 Prolene suture was placed at the base of the hernia sac at the peritoneal reflection.  The hernia sac was then excised and the remnant reduced.  A large Bard PerFix plug was then inserted.  An onlay patch was then placed along the floor of the inguinal canal and secured superiorly to the conjoined tendon and inferiorly to the shelving edge of Poupart's ligament using 2-0 Novafil interrupted sutures.  The internal ring was recreated using a 2-0 Novafil interrupted suture.  The external Bleich aponeurosis was reapproximated using a 2-0 Vicryl running suture.  The subcutaneous layer was  reapproximated using a 3-0 Vicryl interrupted suture.  Exparel was instilled into the surrounding wound.  The skin was closed using a 4-0 Monocryl subcuticular suture.  Dermabond was applied.  All tape and needle counts were correct at the end of the procedure.  The patient was awakened and transferred to PACU in stable condition.  Complications: None  EBL: Minimal  Specimen: None

## 2020-01-16 NOTE — Anesthesia Postprocedure Evaluation (Signed)
Anesthesia Post Note  Patient: Cody Shepard  Procedure(s) Performed: HERNIA REPAIR INGUINAL ADULT (Left Groin)  Patient location during evaluation: PACU Anesthesia Type: General Level of consciousness: awake, awake and alert, sedated and patient cooperative Pain management: pain level controlled Vital Signs Assessment: post-procedure vital signs reviewed and stable Respiratory status: spontaneous breathing, respiratory function stable and nonlabored ventilation Cardiovascular status: stable Postop Assessment: no apparent nausea or vomiting Anesthetic complications: no     Last Vitals:  Vitals:   01/16/20 0738  BP: 117/78  Pulse: 68  Resp: 16  Temp: 36.8 C  SpO2: 100%    Last Pain:  Vitals:   01/16/20 0738  PainSc: 0-No pain                 Alyra Patty

## 2020-01-16 NOTE — Anesthesia Procedure Notes (Signed)
Procedure Name: LMA Insertion Date/Time: 01/16/2020 8:35 AM Performed by: Jonna Munro, CRNA Pre-anesthesia Checklist: Patient identified, Emergency Drugs available, Suction available, Patient being monitored and Timeout performed Patient Re-evaluated:Patient Re-evaluated prior to induction Oxygen Delivery Method: Circle system utilized Preoxygenation: Pre-oxygenation with 100% oxygen Induction Type: IV induction LMA: LMA inserted LMA Size: 5.0 Number of attempts: 1 Placement Confirmation: positive ETCO2 and breath sounds checked- equal and bilateral Tube secured with: Tape Dental Injury: Teeth and Oropharynx as per pre-operative assessment

## 2020-01-16 NOTE — Discharge Instructions (Signed)
Open Hernia Repair, Adult, Care After These instructions give you information about caring for yourself after your procedure. Your doctor may also give you more specific instructions. If you have problems or questions, contact your doctor. Follow these instructions at home: Surgical cut (incision) care   Follow instructions from your doctor about how to take care of your surgical cut area. Make sure you: ? Wash your hands with soap and water before you change your bandage (dressing). If you cannot use soap and water, use hand sanitizer. ? Change your bandage as told by your doctor. ? Leave stitches (sutures), skin glue, or skin tape (adhesive) strips in place. They may need to stay in place for 2 weeks or longer. If tape strips get loose and curl up, you may trim the loose edges. Do not remove tape strips completely unless your doctor says it is okay.  Check your surgical cut every day for signs of infection. Check for: ? More redness, swelling, or pain. ? More fluid or blood. ? Warmth. ? Pus or a bad smell. Activity  Do not drive or use heavy machinery while taking prescription pain medicine. Do not drive until your doctor says it is okay.  Until your doctor says it is okay: ? Do not lift anything that is heavier than 10 lb (4.5 kg). ? Do not play contact sports.  Return to your normal activities as told by your doctor. Ask your doctor what activities are safe. General instructions  To prevent or treat having a hard time pooping (constipation) while you are taking prescription pain medicine, your doctor may recommend that you: ? Drink enough fluid to keep your pee (urine) clear or pale yellow. ? Take over-the-counter or prescription medicines. ? Eat foods that are high in fiber, such as fresh fruits and vegetables, whole grains, and beans. ? Limit foods that are high in fat and processed sugars, such as fried and sweet foods.  Take over-the-counter and prescription medicines only as  told by your doctor.  Do not take baths, swim, or use a hot tub until your doctor says it is okay.  Keep all follow-up visits as told by your doctor. This is important. Contact a doctor if:  You develop a rash.  You have more redness, swelling, or pain around your surgical cut.  You have more fluid or blood coming from your surgical cut.  Your surgical cut feels warm to the touch.  You have pus or a bad smell coming from your surgical cut.  You have a fever or chills.  You have blood in your poop (stool).  You have not pooped in 2-3 days.  Medicine does not help your pain. Get help right away if:  You have chest pain or you are short of breath.  You feel light-headed.  You feel weak and dizzy (feel faint).  You have very bad pain.  You throw up (vomit) and your pain is worse. This information is not intended to replace advice given to you by your health care provider. Make sure you discuss any questions you have with your health care provider. Document Revised: 02/02/2019 Document Reviewed: 03/24/2016 Elsevier Patient Education  [employer]. Bupivacaine Liposomal Suspension for Injection What is this medicine? BUPIVACAINE LIPOSOMAL (bue PIV a kane LIP oh som al) is an anesthetic. It causes loss of feeling in the skin or other tissues. It is used to prevent and to treat pain from some procedures. This medicine may be used for other purposes; ask  your health care provider or pharmacist if you have questions. COMMON BRAND NAME(S): EXPAREL What should I tell my health care provider before I take this medicine? They need to know if you have any of these conditions:  G6PD deficiency  heart disease  kidney disease  liver disease  low blood pressure  lung or breathing disease, like asthma  an unusual or allergic reaction to bupivacaine, other medicines, foods, dyes, or preservatives  pregnant or trying to get pregnant  breast-feeding How should I use this  medicine? This medicine is for injection into the affected area. It is given by a health care professional in a hospital or clinic setting. Talk to your pediatrician regarding the use of this medicine in children. Special care may be needed. Overdosage: If you think you have taken too much of this medicine contact a poison control center or emergency room at once. NOTE: This medicine is only for you. Do not share this medicine with others. What if I miss a dose? This does not apply. What may interact with this medicine? This medicine may interact with the following medications:  acetaminophen  certain antibiotics like dapsone, nitrofurantoin, aminosalicylic acid, sulfonamides  certain medicines for seizures like phenobarbital, phenytoin, valproic acid  chloroquine  cyclophosphamide  flutamide  hydroxyurea  ifosfamide  metoclopramide  nitric oxide  nitroglycerin  nitroprusside  nitrous oxide  other local anesthetics like lidocaine, pramoxine, tetracaine  primaquine  quinine  rasburicase  sulfasalazine This list may not describe all possible interactions. Give your health care provider a list of all the medicines, herbs, non-prescription drugs, or dietary supplements you use. Also tell them if you smoke, drink alcohol, or use illegal drugs. Some items may interact with your medicine. What should I watch for while using this medicine? Your condition will be monitored carefully while you are receiving this medicine. Be careful to avoid injury while the area is numb, and you are not aware of pain. What side effects may I notice from receiving this medicine? Side effects that you should report to your doctor or health care professional as soon as possible:  allergic reactions like skin rash, itching or hives, swelling of the face, lips, or tongue  seizures  signs and symptoms of a dangerous change in heartbeat or heart rhythm like chest pain; dizziness; fast,  irregular heartbeat; palpitations; feeling faint or lightheaded; falls; breathing problems  signs and symptoms of methemoglobinemia such as pale, gray, or blue colored skin; headache; fast heartbeat; shortness of breath; feeling faint or lightheaded, falls; tiredness Side effects that usually do not require medical attention (report to your doctor or health care professional if they continue or are bothersome):  anxious  back pain  changes in taste  changes in vision  constipation  dizziness  fever  nausea, vomiting This list may not describe all possible side effects. Call your doctor for medical advice about side effects. You may report side effects to FDA at 1-800-FDA-1088. Where should I keep my medicine? This drug is given in a hospital or clinic and will not be stored at home. NOTE: This sheet is a summary. It may not cover all possible information. If you have questions about this medicine, talk to your doctor, pharmacist, or health care provider.  2020 Elsevier/Gold Standard (2019-07-24 10:48:23)  General Anesthesia, Adult, Care After This sheet gives you information about how to care for yourself after your procedure. Your health care provider may also give you more specific instructions. If you have problems or questions,  contact your health care provider. What can I expect after the procedure? After the procedure, the following side effects are common:  Pain or discomfort at the IV site.  Nausea.  Vomiting.  Sore throat.  Trouble concentrating.  Feeling cold or chills.  Weak or tired.  Sleepiness and fatigue.  Soreness and body aches. These side effects can affect parts of the body that were not involved in surgery. Follow these instructions at home:  For at least 24 hours after the procedure:  Have a responsible adult stay with you. It is important to have someone help care for you until you are awake and alert.  Rest as needed.  Do  not: ? Participate in activities in which you could fall or become injured. ? Drive. ? Use heavy machinery. ? Drink alcohol. ? Take sleeping pills or medicines that cause drowsiness. ? Make important decisions or sign legal documents. ? Take care of children on your own. Eating and drinking  Follow any instructions from your health care provider about eating or drinking restrictions.  When you feel hungry, start by eating small amounts of foods that are soft and easy to digest (bland), such as toast. Gradually return to your regular diet.  Drink enough fluid to keep your urine pale yellow.  If you vomit, rehydrate by drinking water, juice, or clear broth. General instructions  If you have sleep apnea, surgery and certain medicines can increase your risk for breathing problems. Follow instructions from your health care provider about wearing your sleep device: ? Anytime you are sleeping, including during daytime naps. ? While taking prescription pain medicines, sleeping medicines, or medicines that make you drowsy.  Return to your normal activities as told by your health care provider. Ask your health care provider what activities are safe for you.  Take over-the-counter and prescription medicines only as told by your health care provider.  If you smoke, do not smoke without supervision.  Keep all follow-up visits as told by your health care provider. This is important. Contact a health care provider if:  You have nausea or vomiting that does not get better with medicine.  You cannot eat or drink without vomiting.  You have pain that does not get better with medicine.  You are unable to pass urine.  You develop a skin rash.  You have a fever.  You have redness around your IV site that gets worse. Get help right away if:  You have difficulty breathing.  You have chest pain.  You have blood in your urine or stool, or you vomit blood. Summary  After the procedure, it  is common to have a sore throat or nausea. It is also common to feel tired.  Have a responsible adult stay with you for the first 24 hours after general anesthesia. It is important to have someone help care for you until you are awake and alert.  When you feel hungry, start by eating small amounts of foods that are soft and easy to digest (bland), such as toast. Gradually return to your regular diet.  Drink enough fluid to keep your urine pale yellow.  Return to your normal activities as told by your health care provider. Ask your health care provider what activities are safe for you. This information is not intended to replace advice given to you by your health care provider. Make sure you discuss any questions you have with your health care provider. Document Revised: 10/14/2017 Document Reviewed: 05/27/2017 Elsevier Patient Education  2020 Elsevier Inc.  

## 2020-01-16 NOTE — Interval H&P Note (Signed)
History and Physical Interval Note:  01/16/2020 8:10 AM  Cody Shepard  has presented today for surgery, with the diagnosis of Left inguinal hernia.  The various methods of treatment have been discussed with the patient and family. After consideration of risks, benefits and other options for treatment, the patient has consented to  Procedure(s): HERNIA REPAIR INGUINAL ADULT (Left) as a surgical intervention.  The patient's history has been reviewed, patient examined, no change in status, stable for surgery.  I have reviewed the patient's chart and labs.  Questions were answered to the patient's satisfaction.     Aviva Signs

## 2020-01-16 NOTE — Transfer of Care (Signed)
Immediate Anesthesia Transfer of Care Note  Patient: Cody Shepard  Procedure(s) Performed: HERNIA REPAIR INGUINAL ADULT (Left Groin)  Patient Location: PACU  Anesthesia Type:General  Level of Consciousness: awake, alert , oriented and patient cooperative  Airway & Oxygen Therapy: Patient Spontanous Breathing  Post-op Assessment: Report given to RN, Post -op Vital signs reviewed and stable and Patient moving all extremities X 4  Post vital signs: Reviewed and stable  Last Vitals:  Vitals Value Taken Time  BP 129/87 01/16/20 0950  Temp    Pulse 76 01/16/20 0951  Resp 13 01/16/20 0951  SpO2 97 % 01/16/20 0951  Vitals shown include unvalidated device data.  Last Pain:  Vitals:   01/16/20 0738  PainSc: 0-No pain         Complications: No apparent anesthesia complications

## 2020-01-17 LAB — GLUCOSE, CAPILLARY: Glucose-Capillary: 113 mg/dL — ABNORMAL HIGH (ref 70–99)

## 2020-01-25 ENCOUNTER — Telehealth: Payer: Self-pay | Admitting: General Surgery

## 2020-02-11 ENCOUNTER — Ambulatory Visit: Payer: Self-pay

## 2020-02-11 ENCOUNTER — Other Ambulatory Visit: Payer: Self-pay

## 2020-02-12 ENCOUNTER — Telehealth: Payer: Self-pay | Admitting: Internal Medicine

## 2020-02-12 ENCOUNTER — Encounter: Payer: Self-pay | Admitting: Internal Medicine

## 2020-02-12 NOTE — Telephone Encounter (Signed)
Noted  

## 2020-02-12 NOTE — Telephone Encounter (Signed)
Patient was a no show and letter sent  °

## 2020-03-12 IMAGING — CT CT HEAD CODE STROKE W/O CM
3 of 10 series · 14 of 47 positions shown, 16 images · IV contrast (Isovue)
Comparison: Head CT 01/05/2013

CLINICAL DATA: Code stroke. Dizziness and headache. Blurry vision.

EXAM:
CT HEAD WITHOUT CONTRAST
CT ANGIOGRAPHY OF THE HEAD AND NECK
TECHNIQUE: Contiguous axial images were obtained from the base of the skull
through the vertex without intravenous contrast. Multidetector CT
imaging of the head and neck was performed using the standard
protocol during bolus administration of intravenous contrast.
Multiplanar CT image reconstructions and MIPs were obtained to
evaluate the vascular anatomy. Carotid stenosis measurements (when
applicable) are obtained utilizing NASCET criteria, using the distal
internal carotid diameter as the denominator.
CONTRAST:  100mL OMNIPAQUE IOHEXOL 350 MG/ML SOLN

[Series 12: ax thin · axial · 0.54mm/px · z∈[-207,+131]mm · 8 of 389 slices shown, 10 images]
[im 25/389  brain]
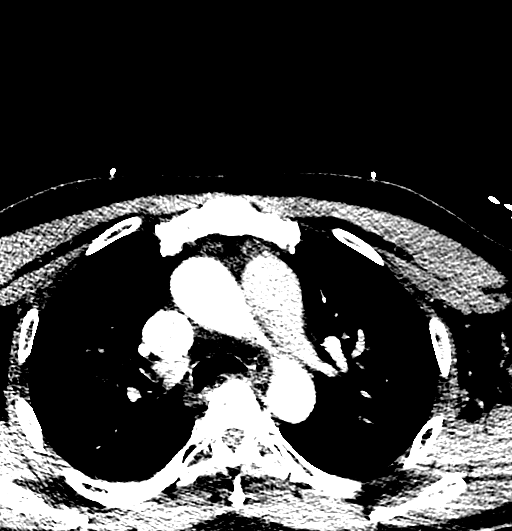
[im 25/389  bone]
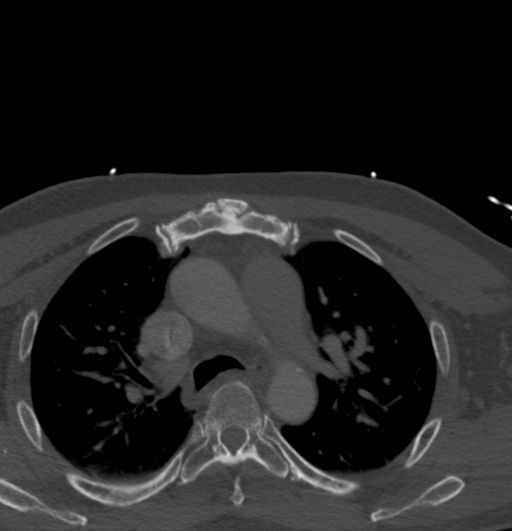
[im 73/389  brain]
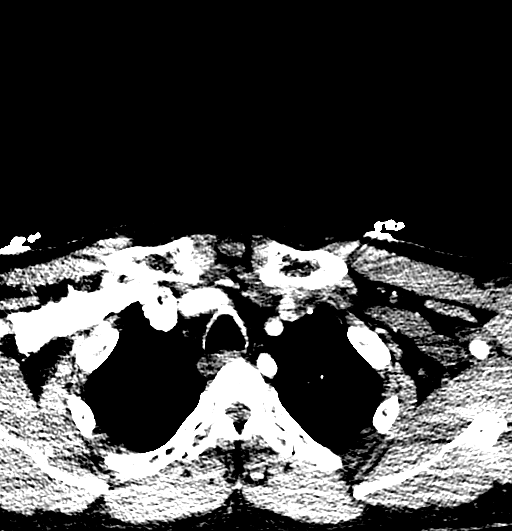
[im 122/389  brain]
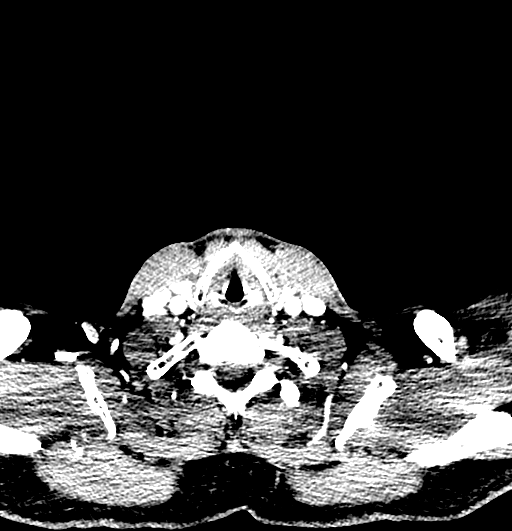
[im 170/389  brain]
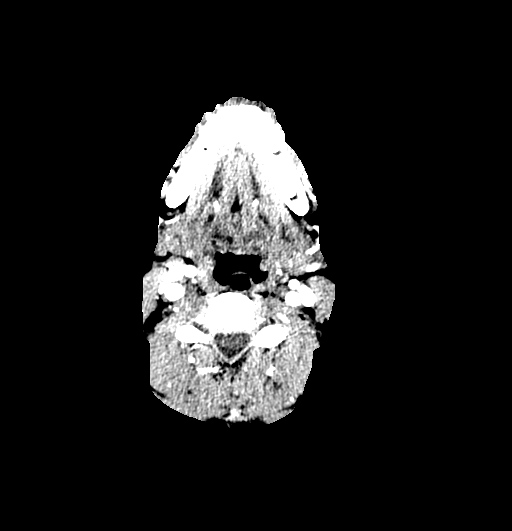
[im 219/389  brain]
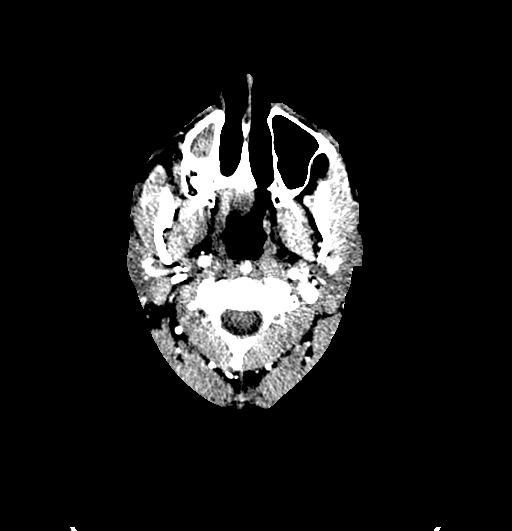
[im 219/389  bone]
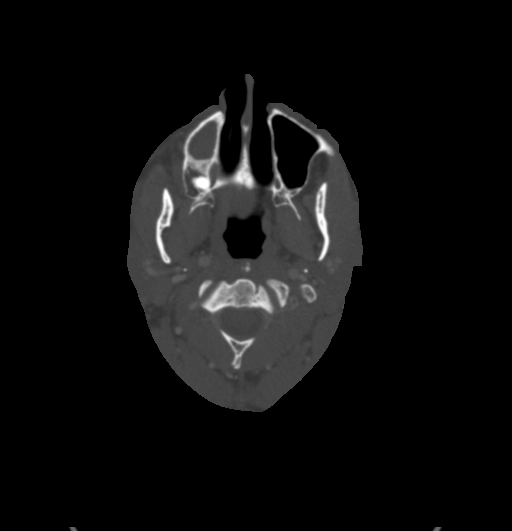
[im 267/389  brain]
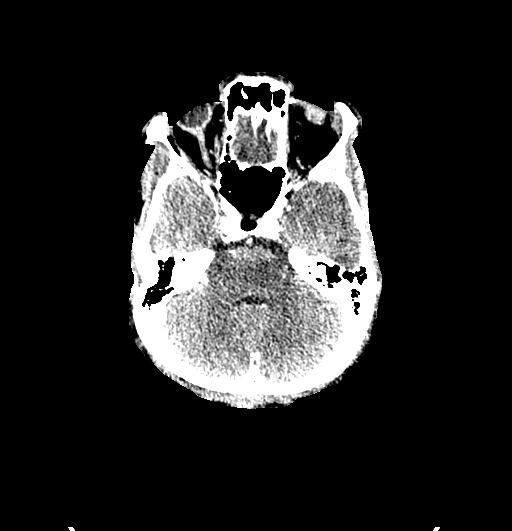
[im 316/389  brain]
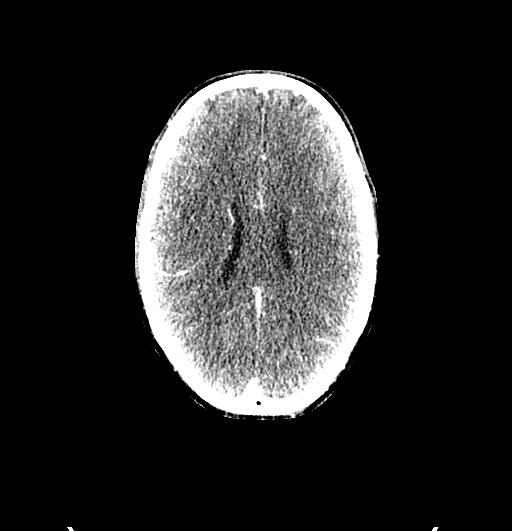
[im 364/389  brain]
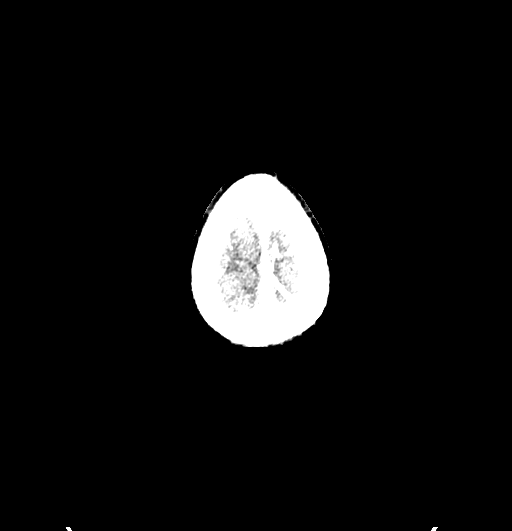

[Series 13: coronal thin · coronal · 0.63mm/px · 3 of 284 slices shown]
[im 95/284  brain]
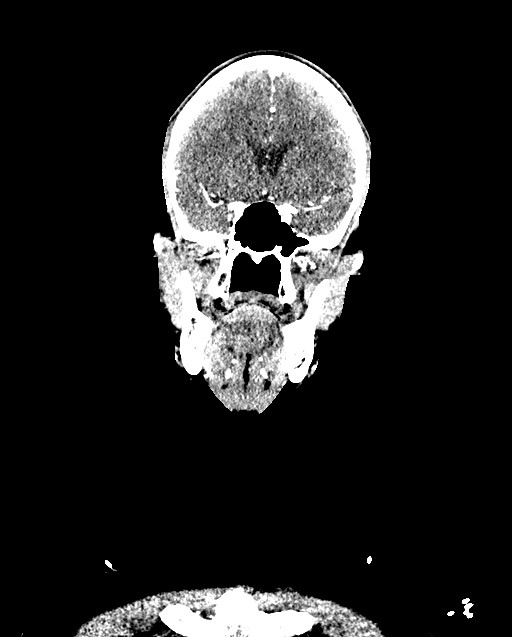
[im 142/284  brain]
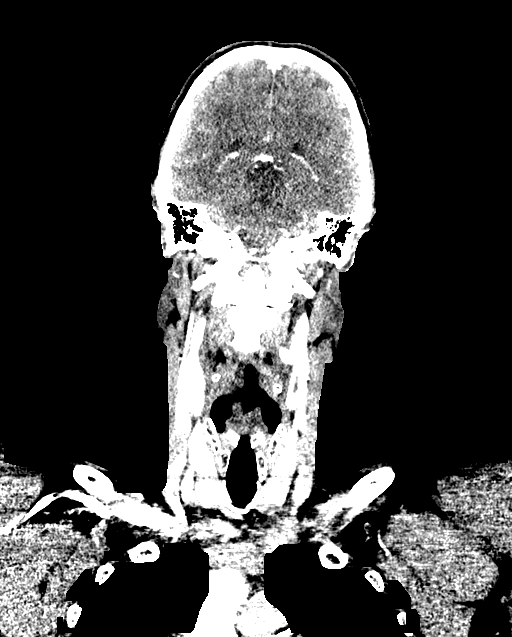
[im 189/284  brain]
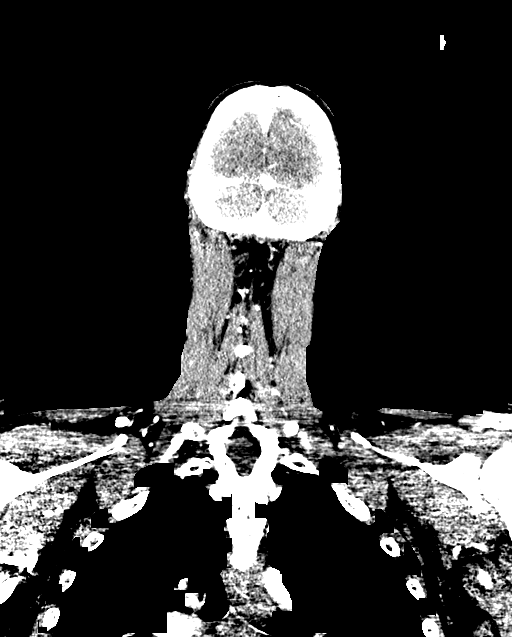

[Series 14: sagittal thin · sagittal · 0.65mm/px · 3 of 325 slices shown]
[im 82/325  brain]
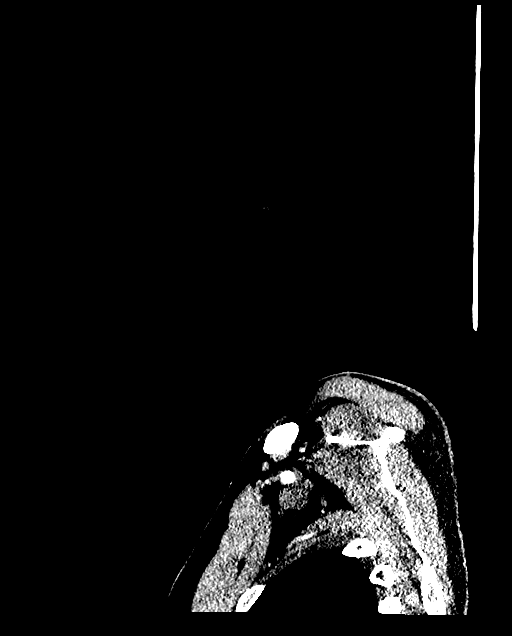
[im 163/325  brain]
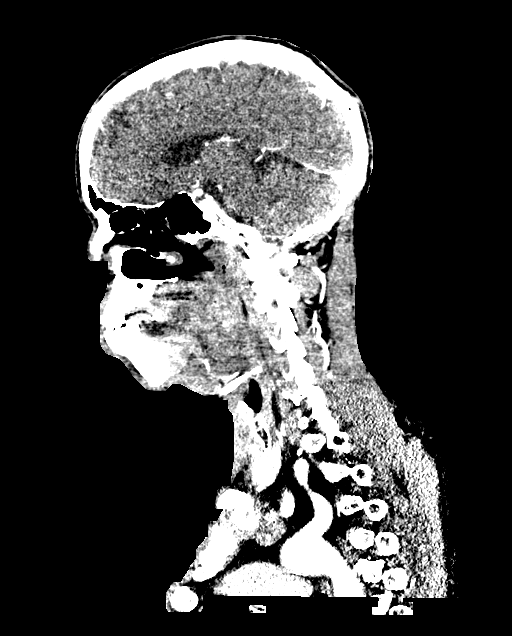
[im 244/325  brain]
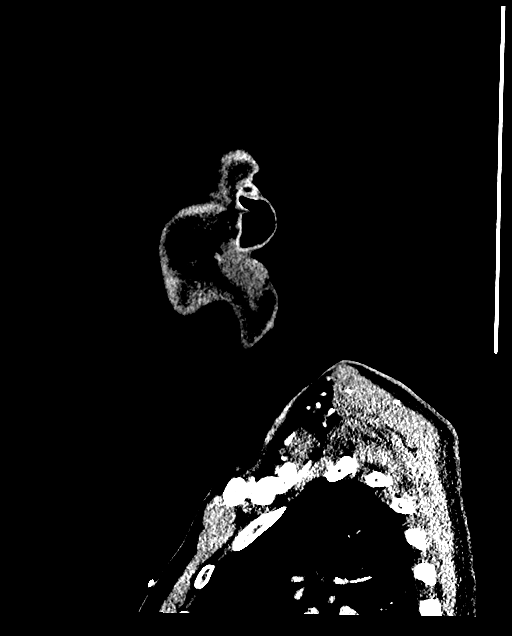

[14 of 47 positions shown; findings below may reference images not displayed]

FINDINGS: CT HEAD FINDINGS

Brain: There is no mass, hemorrhage or extra-axial collection. The
size and configuration of the ventricles and extra-axial CSF spaces
are normal. There is an old infarct of the right parietal lobe,
unchanged since 01/05/2013. No evidence of acute cortical infarct.

Vascular: No abnormal hyperdensity of the major intracranial
arteries or dural venous sinuses. No intracranial atherosclerosis.

Skull: The visualized skull base, calvarium and extracranial soft
tissues are normal.

Sinuses/Orbits: No fluid levels or advanced mucosal thickening of
the visualized paranasal sinuses. No mastoid or middle ear effusion.
The orbits are normal.

ASPECTS (Alberta Stroke Program Early CT Score)

- Ganglionic level infarction (caudate, lentiform nuclei, internal
capsule, insula, M1-M3 cortex): 7

- Supraganglionic infarction (M4-M6 cortex): 3

Total score (0-10 with 10 being normal): 10

CTA NECK FINDINGS

SKELETON: There is no bony spinal canal stenosis. No lytic or
blastic lesion.

OTHER NECK: Normal pharynx, larynx and major salivary glands. No
cervical lymphadenopathy. Unremarkable thyroid gland.

UPPER CHEST: No pneumothorax or pleural effusion. No nodules or
masses.

AORTIC ARCH: There is no calcific atherosclerosis of the aortic
arch. There is no aneurysm, dissection or hemodynamically
significant stenosis of the visualized ascending aorta and aortic
arch. Conventional 3 vessel aortic branching pattern. The visualized
proximal subclavian arteries are widely patent.

RIGHT CAROTID SYSTEM:

--Common carotid artery: Widely patent origin without common carotid
artery dissection or aneurysm.

--Internal carotid artery: Normal without aneurysm, dissection or
stenosis.

--External carotid artery: No acute abnormality.

LEFT CAROTID SYSTEM:

--Common carotid artery: Widely patent origin without common carotid
artery dissection or aneurysm.

--Internal carotid artery: Normal without aneurysm, dissection or
stenosis.

--External carotid artery: No acute abnormality.

VERTEBRAL ARTERIES: Right dominant configuration. Both origins are
normal. No dissection, occlusion or flow-limiting stenosis to the
vertebrobasilar confluence.

CTA HEAD FINDINGS

POSTERIOR CIRCULATION:

--Vertebral arteries: Normal codominant configuration of V4
segments.

--Posterior inferior cerebellar arteries (PICA): Patent origins from
the vertebral arteries.

--Anterior inferior cerebellar arteries (AICA): Patent origins from
the basilar artery.

--Basilar artery: Normal.

--Superior cerebellar arteries: Normal.

--Posterior cerebral arteries (PCA): Normal. Both are predominantly
supplied by the posterior communicating arteries (p-comm).

ANTERIOR CIRCULATION:

--Intracranial internal carotid arteries: Normal.

--Anterior cerebral arteries (ACA): Normal. Both A1 segments are
present. Patent anterior communicating artery (a-comm).

--Middle cerebral arteries (MCA): Normal.

VENOUS SINUSES: As permitted by contrast timing, patent.

ANATOMIC VARIANTS: None

Review of the MIP images confirms the above findings.
IMPRESSION: 1. No acute hemorrhage.  Is 10.
2. No emergent large vessel occlusion or high-grade stenosis.

These results were called by telephone at the time of interpretation
on 03/10/2019 at [DATE] to Dr. PARI SPEAR , who verbally
acknowledged these results.

## 2020-03-13 IMAGING — DX ABDOMEN - 1 VIEW
2 series · 2 of 2 positions shown · non-contrast
Comparison: CT abdomen and pelvis 09/28/2018.

CLINICAL DATA: Laparoscopic LEFT adrenalectomy in December 2018, with
intermittent constipation since that time and acute onset of
generalized abdominal pain that began 3 days ago.

EXAM:
ABDOMEN - 1 VIEW

[abdomen kub (1 of 2)]
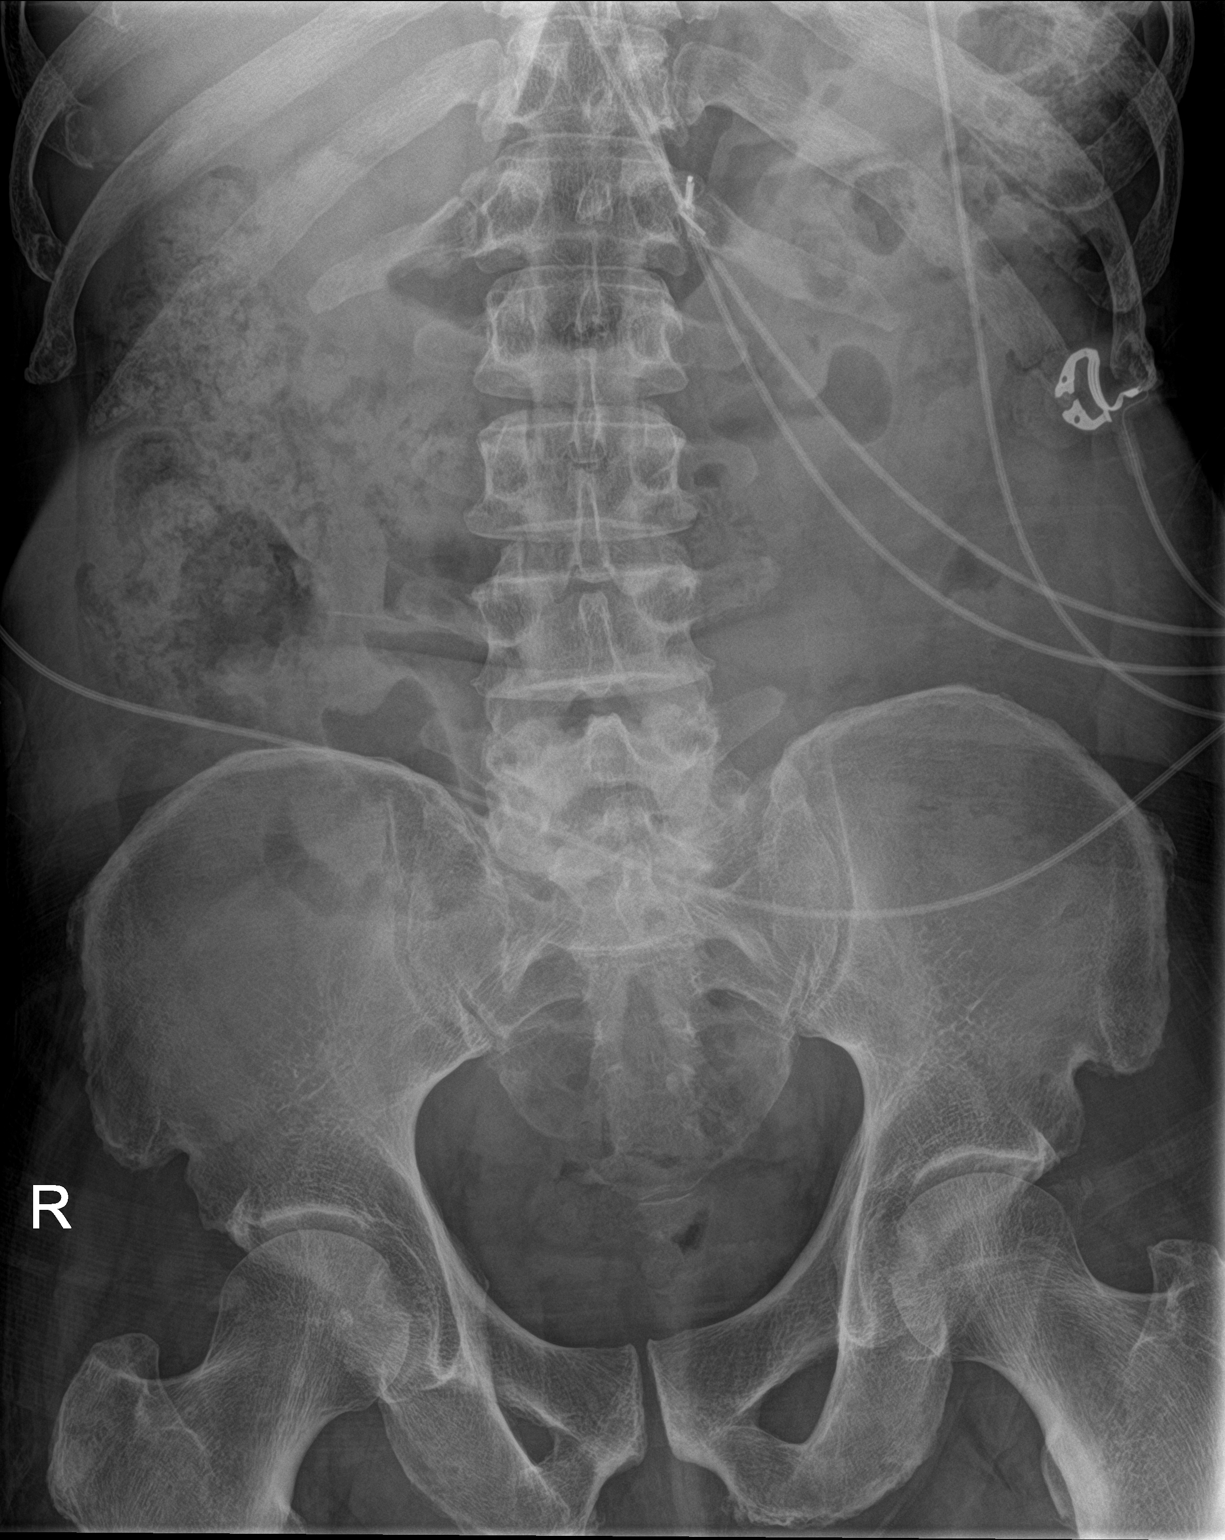

[abdomen kub (2 of 2)]
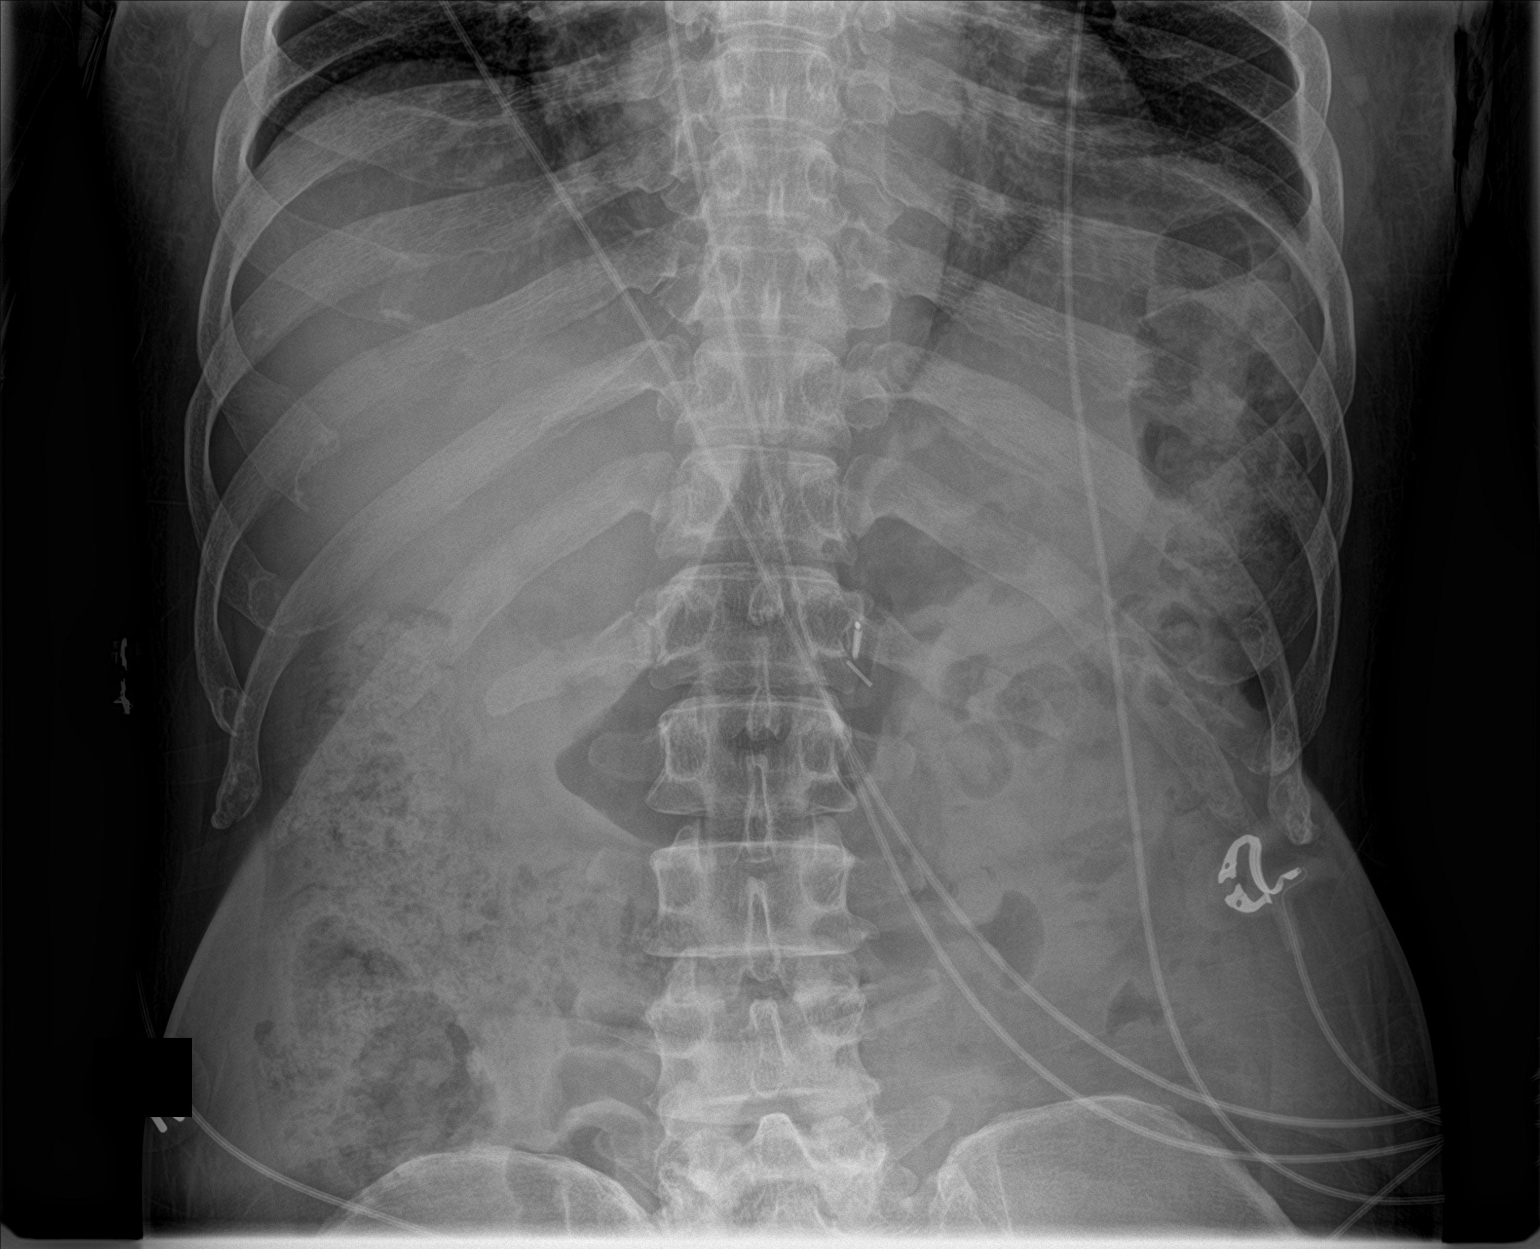

[2 of 2 positions shown; findings below may reference images not displayed]

FINDINGS: Bowel gas pattern unremarkable without evidence of obstruction or
significant ileus. Large stool burden in the ascending and
transverse colon. The LEFT renal calculus identified on the prior CT
is not visible on the current x-ray. Surgical clips to the LEFT of
midline at the T12 level at the site of the prior adrenalectomy.
Mild degenerative changes involving lumbar spine.
IMPRESSION: No acute abdominal abnormality. Large stool burden in the ascending
and transverse colon.

## 2020-05-13 ENCOUNTER — Other Ambulatory Visit: Payer: Self-pay

## 2020-05-13 ENCOUNTER — Ambulatory Visit: Payer: Medicare Other

## 2022-04-26 ENCOUNTER — Encounter (HOSPITAL_COMMUNITY): Payer: Self-pay | Admitting: *Deleted

## 2022-04-26 ENCOUNTER — Emergency Department (HOSPITAL_COMMUNITY)
Admission: EM | Admit: 2022-04-26 | Discharge: 2022-04-26 | Disposition: A | Discharge status: Home / Self Care | Payer: Medicare Other | Attending: Emergency Medicine | Admitting: Emergency Medicine

## 2022-04-26 ENCOUNTER — Other Ambulatory Visit: Payer: Self-pay

## 2022-04-26 ENCOUNTER — Emergency Department (HOSPITAL_COMMUNITY): Payer: Medicare Other

## 2022-04-26 DIAGNOSIS — I10 Essential (primary) hypertension: Secondary | ICD-10-CM | POA: Diagnosis not present

## 2022-04-26 DIAGNOSIS — Z79899 Other long term (current) drug therapy: Secondary | ICD-10-CM | POA: Insufficient documentation

## 2022-04-26 DIAGNOSIS — J019 Acute sinusitis, unspecified: Secondary | ICD-10-CM

## 2022-04-26 DIAGNOSIS — R200 Anesthesia of skin: Secondary | ICD-10-CM | POA: Insufficient documentation

## 2022-04-26 DIAGNOSIS — N289 Disorder of kidney and ureter, unspecified: Secondary | ICD-10-CM | POA: Diagnosis not present

## 2022-04-26 DIAGNOSIS — R519 Headache, unspecified: Secondary | ICD-10-CM | POA: Diagnosis present

## 2022-04-26 DIAGNOSIS — J449 Chronic obstructive pulmonary disease, unspecified: Secondary | ICD-10-CM | POA: Diagnosis not present

## 2022-04-26 DIAGNOSIS — I2510 Atherosclerotic heart disease of native coronary artery without angina pectoris: Secondary | ICD-10-CM | POA: Insufficient documentation

## 2022-04-26 DIAGNOSIS — E119 Type 2 diabetes mellitus without complications: Secondary | ICD-10-CM | POA: Diagnosis not present

## 2022-04-26 LAB — BASIC METABOLIC PANEL
Anion gap: 6 (ref 5–15)
BUN: 21 mg/dL — ABNORMAL HIGH (ref 6–20)
CO2: 24 mmol/L (ref 22–32)
Calcium: 8.4 mg/dL — ABNORMAL LOW (ref 8.9–10.3)
Chloride: 104 mmol/L (ref 98–111)
Creatinine, Ser: 1.86 mg/dL — ABNORMAL HIGH (ref 0.61–1.24)
GFR, Estimated: 41 mL/min — ABNORMAL LOW (ref >60)
Glucose, Bld: 99 mg/dL (ref 70–99)
Potassium: 4 mmol/L (ref 3.5–5.1)
Sodium: 134 mmol/L — ABNORMAL LOW (ref 135–145)

## 2022-04-26 LAB — CBC WITH DIFFERENTIAL/PLATELET
Abs Immature Granulocytes: 0.02 10*3/uL (ref 0.00–0.07)
Basophils Absolute: 0 10*3/uL (ref 0.0–0.1)
Basophils Relative: 1 %
Eosinophils Absolute: 0.3 10*3/uL (ref 0.0–0.5)
Eosinophils Relative: 6 %
HCT: 39.5 % (ref 39.0–52.0)
Hemoglobin: 13 g/dL (ref 13.0–17.0)
Immature Granulocytes: 0 %
Lymphocytes Relative: 32 %
Lymphs Abs: 1.7 10*3/uL (ref 0.7–4.0)
MCH: 32.2 pg (ref 26.0–34.0)
MCHC: 32.9 g/dL (ref 30.0–36.0)
MCV: 97.8 fL (ref 80.0–100.0)
Monocytes Absolute: 0.9 10*3/uL (ref 0.1–1.0)
Monocytes Relative: 17 %
Neutro Abs: 2.3 10*3/uL (ref 1.7–7.7)
Neutrophils Relative %: 44 %
Platelets: 214 10*3/uL (ref 150–400)
RBC: 4.04 MIL/uL — ABNORMAL LOW (ref 4.22–5.81)
RDW: 13.2 % (ref 11.5–15.5)
WBC: 5.2 10*3/uL (ref 4.0–10.5)
nRBC: 0 % (ref 0.0–0.2)

## 2022-04-26 MED ORDER — AMOXICILLIN 500 MG PO CAPS: 500.0000 mg | ORAL_CAPSULE | Freq: Three times a day (TID) | ORAL | 0 refills | Status: DC

## 2022-04-26 MED ORDER — BUTALBITAL-APAP-CAFFEINE 50-325-40 MG PO TABS: 1.0000 | ORAL_TABLET | Freq: Four times a day (QID) | ORAL | 0 refills | Status: AC | PRN

## 2022-04-26 MED ORDER — ACETAMINOPHEN 325 MG PO TABS
650.0000 mg | ORAL_TABLET | Freq: Once | ORAL | Status: AC
  Administered 2022-04-26: 650 mg via ORAL
  Filled 2022-04-26: qty 2

## 2022-04-26 NOTE — Discharge Instructions (Signed)
CT scan did not show any abnormalities in the brain but there was some inflammation of the sinus.  Is possible your headaches could be coming from sinusitis.  Take the medications as prescribed.  Follow-up with your primary care doctor to be rechecked.  Return as needed for worsening symptoms

## 2022-04-26 NOTE — ED Triage Notes (Signed)
Pt with c/o HA for 2 weeks.  OTC medication will help some but HA returns.

## 2022-04-26 NOTE — ED Provider Triage Note (Signed)
Emergency Medicine Provider Triage Evaluation Note  Cody Shepard , a 56 y.o. male  was evaluated in triage.  Pt complains of global headache has been intermittent but ongoing for the last couple weeks.  Patient does not endorse a history of significant headaches.  He usually takes over-the-counter medicines when he has a mild headache and they resolve.  He states that he has been having some blurred vision intermittently with these headaches in addition to numbness to the 3 fingers of his left hand.  No weakness to the extremities.  No fever or chills.  Review of Systems  Positive:  Negative: See above  Physical Exam  BP 134/79 (BP Location: Right Arm)   Pulse 69   Temp (!) 97.5 F (36.4 C) (Oral)   Resp 14   Ht '5\' 9"'$  (1.753 m)   Wt 73.5 kg   SpO2 100%   BMI 23.92 kg/m  Gen:   Awake, no distress   Resp:  Normal effort  MSK:   Moves extremities without difficulty  Other:  Cranial nerves II through XII are intact.  5/5 strength to the upper and lower extremities.  Normal sensation to the upper and lower extremities.  No dysmetria with finger-nose.  No pronator drift.  Patient talking in complete sentences.  Medical Decision Making  Medically screening exam initiated at 4:42 PM.  Appropriate orders placed.  [name] was informed that the remainder of the evaluation will be completed by another provider, this initial triage assessment does not replace that evaluation, and the importance of remaining in the ED until their evaluation is complete.     [name], [place] 04/26/22 1645

## 2022-04-26 NOTE — ED Provider Notes (Signed)
[facility] Provider Note   CSN: [id number] Arrival date & time: 04/26/22  1321     History  Chief Complaint  Patient presents with   Headache    X 2  weeks    Cody Shepard is a 56 y.o. male.   Headache Associated symptoms: no fever    Patient has history of hypertension coronary disease hypercholesterolemia COPD diabetes. Patient presents to the ED for evaluation of a headache.  Patient states he has had a global headache ongoing for the last couple weeks.  He used to have headaches previously but not in a while.  He takes over-the-counter medications which usually help but the headache has been returning.  Patient states he has had some intermittent numbness in his fingers and may be some blurred vision but no symptoms right now.  He is not having any weakness.  No fevers or chills.  Home Medications Prior to Admission medications   Medication Sig Start Date End Date Taking? Authorizing Provider  amoxicillin (AMOXIL) 500 MG capsule Take 1 capsule (500 mg total) by mouth 3 (three) times daily. 04/26/22  Yes Dorie Rank, MD  butalbital-acetaminophen-caffeine (FIORICET) [phone] MG tablet Take 1 tablet by mouth every 6 (six) hours as needed for headache. 04/26/22 04/26/23 Yes Dorie Rank, MD  albuterol (PROVENTIL HFA;VENTOLIN HFA) 108 (90 BASE) MCG/ACT inhaler Inhale 2 puffs into the lungs every 4 (four) hours as needed for wheezing or shortness of breath.     [provider]  atorvastatin (LIPITOR) 10 MG tablet Take 10 mg by mouth daily. 12/26/19   [provider]  carvedilol (COREG) 25 MG tablet Take 25 mg by mouth 2 (two) times daily with a meal. Breakfast & supper    [provider]  cloNIDine (CATAPRES - DOSED IN MG/24 HR) 0.1 mg/24hr patch Place 0.1 mg onto the skin every Sunday.    [provider]  diclofenac Sodium (VOLTAREN) 1 % GEL Apply 1 g topically 4 (four) times daily as needed (pain.).    [provider]   fluticasone (FLONASE) 50 MCG/ACT nasal spray Place 2 sprays into both nostrils daily.     [provider]  Fluticasone-Umeclidin-Vilant (TRELEGY ELLIPTA) 100-62.5-25 MCG/INH AEPB Inhale 1 Inhaler into the lungs daily.    [provider]  hydrALAZINE (APRESOLINE) 50 MG tablet Take 50 mg by mouth in the morning and at bedtime. 12/10/19   [provider]  HYDROcodone-acetaminophen (NORCO) 5-325 MG tablet Take 1 tablet by mouth every 4 (four) hours as needed for moderate pain. 01/16/20   Aviva Signs, MD  isosorbide mononitrate (IMDUR) 60 MG 24 hr tablet Take 60 mg by mouth in the morning and at bedtime.  11/06/19   [provider]  metFORMIN (GLUCOPHAGE) 500 MG tablet Take 500 mg by mouth 2 (two) times daily. 01/07/20   [provider]  nitroGLYCERIN (NITROSTAT) 0.4 MG SL tablet Place 0.4 mg under the tongue every 5 (five) minutes x 3 doses as needed for chest pain.    [provider]  olmesartan (BENICAR) 20 MG tablet Take 20 mg by mouth daily. 02/26/19   [provider]  ranolazine (RANEXA) 500 MG 12 hr tablet Take 500 mg by mouth 2 (two) times daily.    [provider]  vitamin C (ASCORBIC ACID) 250 MG tablet Take 250 mg by mouth daily.    [provider]      Allergies    Patient has no known allergies.    Review  of Systems   Review of Systems  Constitutional:  Negative for fever.  Neurological:  Positive for headaches.    Physical Exam Updated Vital Signs BP (!) 150/96   Pulse 68   Temp (!) 97.5 F (36.4 C) (Oral)   Resp 16   Ht 1.753 m ('5\' 9"'$ )   Wt 73.5 kg   SpO2 99%   BMI 23.92 kg/m  Physical Exam Vitals and nursing note reviewed.  Constitutional:      General: He is not in acute distress.    Appearance: He is well-developed.  HENT:     Head: Normocephalic and atraumatic.     Right Ear: External ear normal.     Left Ear: External ear normal.  Eyes:     General: No visual field deficit or  scleral icterus.       Right eye: No discharge.        Left eye: No discharge.     Conjunctiva/sclera: Conjunctivae normal.  Neck:     Trachea: No tracheal deviation.  Cardiovascular:     Rate and Rhythm: Normal rate and regular rhythm.  Pulmonary:     Effort: Pulmonary effort is normal. No respiratory distress.     Breath sounds: Normal breath sounds. No stridor. No wheezing or rales.  Abdominal:     General: Bowel sounds are normal. There is no distension.     Palpations: Abdomen is soft.     Tenderness: There is no abdominal tenderness. There is no guarding or rebound.  Musculoskeletal:        General: No tenderness or deformity.     Cervical back: Neck supple.  Skin:    General: Skin is warm and dry.     Findings: No rash.  Neurological:     General: No focal deficit present.     Mental Status: He is alert and oriented to person, place, and time.     Cranial Nerves: No cranial nerve deficit (no facial droop, extraocular movements intact, no slurred speech), dysarthria or facial asymmetry.     Sensory: No sensory deficit.     Motor: No weakness, abnormal muscle tone or seizure activity.     Coordination: Coordination normal.  Psychiatric:        Mood and Affect: Mood normal.     ED Results / Procedures / Treatments   Labs (all labs ordered are listed, but only abnormal results are displayed) Labs Reviewed  CBC WITH DIFFERENTIAL/PLATELET - Abnormal; Notable for the following components:      Result Value   RBC 4.04 (*)    All other components within normal limits  BASIC METABOLIC PANEL - Abnormal; Notable for the following components:   Sodium 134 (*)    BUN 21 (*)    Creatinine, Ser 1.86 (*)    Calcium 8.4 (*)    GFR, Estimated 41 (*)    All other components within normal limits    EKG None  Radiology CT Head Wo Contrast  Result Date: 04/26/2022 CLINICAL DATA:  Headache EXAM: CT HEAD WITHOUT CONTRAST TECHNIQUE: Contiguous axial images were obtained from the  base of the skull through the vertex without intravenous contrast. RADIATION DOSE REDUCTION: This exam was performed according to the departmental dose-optimization program which includes automated exposure control, adjustment of the mA and/or kV according to patient size and/or use of iterative reconstruction technique. COMPARISON:  CT brain 01/08/2019 FINDINGS: Brain: No acute territorial infarction, hemorrhage, or intracranial mass. Patchy hypodensity in the white matter consistent  with chronic small vessel ischemic change. More focal hypodensity within the right parietal white matter is chronic and may reflect old white matter infarct. The ventricles are nonenlarged Vascular: No hyperdense vessels.  Carotid vascular calcification Skull: Normal. Negative for fracture or focal lesion. Sinuses/Orbits: Fluid level right maxillary sinus. Mild mucosal thickening Other: None IMPRESSION: 1. No CT evidence for acute intracranial abnormality. 2. Mild chronic small vessel ischemic changes of the white matter Electronically Signed   By: Donavan Foil M.D.   On: 04/26/2022 17:03    Procedures Procedures    Medications Ordered in ED Medications  acetaminophen (TYLENOL) tablet 650 mg (650 mg Oral Given 04/26/22 2018)    ED Course/ Medical Decision Making/ A&P Clinical Course as of 04/26/22 2022  Mon Apr 26, 2022  2001 CT Head Wo Contrast [JK]  8119 Basic metabolic panel(!) Stable renal insufficiency [JK]  2019 CBC with Differential(!) Normal [JK]  2019 CT Head Wo Contrast Head CT without acute changes. [JK]    Clinical Course User Index [JK] Dorie Rank, MD                           Medical Decision Making Problems Addressed: Acute sinusitis, recurrence not specified, unspecified location: acute illness or injury Hypertension, unspecified type: chronic illness or injury  Amount and/or Complexity of Data Reviewed Labs: ordered. Decision-making details documented in ED Course. Radiology:   Decision-making details documented in ED Course.  Risk OTC drugs.   Patient presented to ED for evaluation of headaches.  No neurologic deficits on exam to suggest stroke.  Head CT does not show any signs of hemorrhage or mass.  Laboratory tests are reassuring.  Exam is unremarkable.  Patient does have some mild sinus inflammation noted on CT scan.  We will try treatment of sinusitis.  Patient's blood pressure also noted to be slightly elevated.  Recommend outpatient follow-up with PCP.       Final Clinical Impression(s) / ED Diagnoses Final diagnoses:  Hypertension, unspecified type  Acute sinusitis, recurrence not specified, unspecified location    Rx / DC Orders ED Discharge Orders          Ordered    amoxicillin (AMOXIL) 500 MG capsule  3 times daily        04/26/22 2022    butalbital-acetaminophen-caffeine (FIORICET) 50-325-40 MG tablet  Every 6 hours PRN        04/26/22 2022              Dorie Rank, MD 04/26/22 2022

## 2022-05-25 ENCOUNTER — Encounter: Payer: Self-pay | Admitting: *Deleted

## 2022-10-25 ENCOUNTER — Emergency Department (HOSPITAL_COMMUNITY): Payer: Medicare Other

## 2022-10-25 ENCOUNTER — Other Ambulatory Visit: Payer: Self-pay

## 2022-10-25 ENCOUNTER — Encounter (HOSPITAL_COMMUNITY): Payer: Self-pay

## 2022-10-25 ENCOUNTER — Emergency Department (HOSPITAL_COMMUNITY)
Admission: EM | Admit: 2022-10-25 | Discharge: 2022-10-25 | Disposition: A | Discharge status: Home / Self Care | Payer: Medicare Other | Attending: Emergency Medicine | Admitting: Emergency Medicine

## 2022-10-25 DIAGNOSIS — E7989 Other specified disorders of purine and pyrimidine metabolism: Secondary | ICD-10-CM | POA: Diagnosis not present

## 2022-10-25 DIAGNOSIS — E1165 Type 2 diabetes mellitus with hyperglycemia: Secondary | ICD-10-CM | POA: Diagnosis not present

## 2022-10-25 DIAGNOSIS — J449 Chronic obstructive pulmonary disease, unspecified: Secondary | ICD-10-CM | POA: Diagnosis not present

## 2022-10-25 DIAGNOSIS — I2510 Atherosclerotic heart disease of native coronary artery without angina pectoris: Secondary | ICD-10-CM | POA: Diagnosis not present

## 2022-10-25 DIAGNOSIS — Z79899 Other long term (current) drug therapy: Secondary | ICD-10-CM | POA: Diagnosis not present

## 2022-10-25 DIAGNOSIS — E871 Hypo-osmolality and hyponatremia: Secondary | ICD-10-CM | POA: Diagnosis not present

## 2022-10-25 DIAGNOSIS — K5730 Diverticulosis of large intestine without perforation or abscess without bleeding: Secondary | ICD-10-CM | POA: Insufficient documentation

## 2022-10-25 DIAGNOSIS — Z7951 Long term (current) use of inhaled steroids: Secondary | ICD-10-CM | POA: Diagnosis not present

## 2022-10-25 DIAGNOSIS — I10 Essential (primary) hypertension: Secondary | ICD-10-CM | POA: Diagnosis not present

## 2022-10-25 DIAGNOSIS — Z7984 Long term (current) use of oral hypoglycemic drugs: Secondary | ICD-10-CM | POA: Diagnosis not present

## 2022-10-25 DIAGNOSIS — I700 Atherosclerosis of aorta: Secondary | ICD-10-CM | POA: Diagnosis not present

## 2022-10-25 DIAGNOSIS — R109 Unspecified abdominal pain: Secondary | ICD-10-CM | POA: Diagnosis present

## 2022-10-25 LAB — COMPREHENSIVE METABOLIC PANEL
ALT: 25 U/L (ref 0–44)
AST: 24 U/L (ref 15–41)
Albumin: 3.8 g/dL (ref 3.5–5.0)
Alkaline Phosphatase: 50 U/L (ref 38–126)
Anion gap: 6 (ref 5–15)
BUN: 27 mg/dL — ABNORMAL HIGH (ref 6–20)
CO2: 25 mmol/L (ref 22–32)
Calcium: 9.5 mg/dL (ref 8.9–10.3)
Chloride: 103 mmol/L (ref 98–111)
Creatinine, Ser: 1.87 mg/dL — ABNORMAL HIGH (ref 0.61–1.24)
GFR, Estimated: 41 mL/min — ABNORMAL LOW (ref >60)
Glucose, Bld: 121 mg/dL — ABNORMAL HIGH (ref 70–99)
Potassium: 4.3 mmol/L (ref 3.5–5.1)
Sodium: 134 mmol/L — ABNORMAL LOW (ref 135–145)
Total Bilirubin: 0.8 mg/dL (ref 0.3–1.2)
Total Protein: 8.1 g/dL (ref 6.5–8.1)

## 2022-10-25 LAB — URINALYSIS, ROUTINE W REFLEX MICROSCOPIC
Bilirubin Urine: NEGATIVE
Glucose, UA: NEGATIVE mg/dL
Hgb urine dipstick: NEGATIVE
Ketones, ur: NEGATIVE mg/dL
Leukocytes,Ua: NEGATIVE
Nitrite: NEGATIVE
Protein, ur: NEGATIVE mg/dL
Specific Gravity, Urine: 1.01 (ref 1.005–1.030)
pH: 5 (ref 5.0–8.0)

## 2022-10-25 LAB — CBC WITH DIFFERENTIAL/PLATELET
Abs Immature Granulocytes: 0.04 10*3/uL (ref 0.00–0.07)
Basophils Absolute: 0.1 10*3/uL (ref 0.0–0.1)
Basophils Relative: 1 %
Eosinophils Absolute: 0.3 10*3/uL (ref 0.0–0.5)
Eosinophils Relative: 6 %
HCT: 42.4 % (ref 39.0–52.0)
Hemoglobin: 14 g/dL (ref 13.0–17.0)
Immature Granulocytes: 1 %
Lymphocytes Relative: 30 %
Lymphs Abs: 1.3 10*3/uL (ref 0.7–4.0)
MCH: 31.4 pg (ref 26.0–34.0)
MCHC: 33 g/dL (ref 30.0–36.0)
MCV: 95.1 fL (ref 80.0–100.0)
Monocytes Absolute: 0.5 10*3/uL (ref 0.1–1.0)
Monocytes Relative: 11 %
Neutro Abs: 2.1 10*3/uL (ref 1.7–7.7)
Neutrophils Relative %: 51 %
Platelets: 241 10*3/uL (ref 150–400)
RBC: 4.46 MIL/uL (ref 4.22–5.81)
RDW: 12.7 % (ref 11.5–15.5)
WBC: 4.2 10*3/uL (ref 4.0–10.5)
nRBC: 0 % (ref 0.0–0.2)

## 2022-10-25 LAB — LIPASE, BLOOD: Lipase: 41 U/L (ref 11–51)

## 2022-10-25 MED ORDER — HYDROMORPHONE HCL 1 MG/ML IJ SOLN
1.0000 mg | Freq: Once | INTRAMUSCULAR | Status: AC
  Administered 2022-10-25: 1 mg via INTRAVENOUS
  Filled 2022-10-25: qty 1

## 2022-10-25 MED ORDER — HYDROCODONE-ACETAMINOPHEN 5-325 MG PO TABS
1.0000 | ORAL_TABLET | Freq: Once | ORAL | Status: AC
  Administered 2022-10-25: 1 via ORAL
  Filled 2022-10-25: qty 1

## 2022-10-25 MED ORDER — ONDANSETRON 8 MG PO TBDP
8.0000 mg | ORAL_TABLET | Freq: Once | ORAL | Status: AC
  Administered 2022-10-25: 8 mg via ORAL
  Filled 2022-10-25: qty 1

## 2022-10-25 NOTE — ED Provider Notes (Signed)
[facility] EMERGENCY DEPARTMENT Provider Note   CSN: [id number] Arrival date & time: 10/25/22  4166     History  Chief Complaint  Patient presents with   Abdominal Pain   HPI Cody Shepard is a 57 y.o. male with hypertension, diabetes, COPD, hyperlipidemia and CAD presenting for flank pain.  Pain started 3 days ago.  It is located in his left flank.  Denies trauma.  Denies urinary changes.  Pain has been constant and feels like aching.  It is nonradiating.  He has tried Aleve but it has not helped.  Denies hematuria, bloody stools, hematemesis.  Denies fever and chills. Hurts worse if he takes a deep breath.    Abdominal Pain      Home Medications Prior to Admission medications   Medication Sig Start Date End Date Taking? Authorizing Provider  albuterol (PROVENTIL HFA;VENTOLIN HFA) 108 (90 BASE) MCG/ACT inhaler Inhale 2 puffs into the lungs every 4 (four) hours as needed for wheezing or shortness of breath.     [provider]  amoxicillin (AMOXIL) 500 MG capsule Take 1 capsule (500 mg total) by mouth 3 (three) times daily. 04/26/22   Dorie Rank, MD  atorvastatin (LIPITOR) 10 MG tablet Take 10 mg by mouth daily. 12/26/19   [provider]  butalbital-acetaminophen-caffeine (FIORICET) 50-325-40 MG tablet Take 1 tablet by mouth every 6 (six) hours as needed for headache. 04/26/22 04/26/23  Dorie Rank, MD  carvedilol (COREG) 25 MG tablet Take 25 mg by mouth 2 (two) times daily with a meal. Breakfast & supper    [provider]  cloNIDine (CATAPRES - DOSED IN MG/24 HR) 0.1 mg/24hr patch Place 0.1 mg onto the skin every Sunday.    [provider]  diclofenac Sodium (VOLTAREN) 1 % GEL Apply 1 g topically 4 (four) times daily as needed (pain.).    [provider]  fluticasone (FLONASE) 50 MCG/ACT nasal spray Place 2 sprays into both nostrils daily.     [provider]  Fluticasone-Umeclidin-Vilant (TRELEGY ELLIPTA) 100-62.5-25 MCG/INH AEPB  Inhale 1 Inhaler into the lungs daily.    [provider]  hydrALAZINE (APRESOLINE) 50 MG tablet Take 50 mg by mouth in the morning and at bedtime. 12/10/19   [provider]  HYDROcodone-acetaminophen (NORCO) 5-325 MG tablet Take 1 tablet by mouth every 4 (four) hours as needed for moderate pain. 01/16/20   Aviva Signs, MD  isosorbide mononitrate (IMDUR) 60 MG 24 hr tablet Take 60 mg by mouth in the morning and at bedtime.  11/06/19   [provider]  metFORMIN (GLUCOPHAGE) 500 MG tablet Take 500 mg by mouth 2 (two) times daily. 01/07/20   [provider]  nitroGLYCERIN (NITROSTAT) 0.4 MG SL tablet Place 0.4 mg under the tongue every 5 (five) minutes x 3 doses as needed for chest pain.    [provider]  olmesartan (BENICAR) 20 MG tablet Take 20 mg by mouth daily. 02/26/19   [provider]  ranolazine (RANEXA) 500 MG 12 hr tablet Take 500 mg by mouth 2 (two) times daily.    [provider]  vitamin C (ASCORBIC ACID) 250 MG tablet Take 250 mg by mouth daily.    [provider]      Allergies    Patient has no known allergies.    Review of Systems   Review of Systems  Gastrointestinal:  Positive for abdominal pain.  Genitourinary:  Positive for flank pain.    Physical Exam   Vitals:  10/25/22 1356 10/25/22 1430  BP:  (!) 126/94  Pulse:  63  Resp:  13  Temp: 97.6 F (36.4 C)   SpO2:  95%    CONSTITUTIONAL:  well-appearing, NAD NEURO:  Alert and oriented x 3, CN 3-12 grossly intact EYES:  eyes equal and reactive ENT/NECK:  Supple, no stridor  CARDIO:  regular rate and regular rhythm, appears well-perfused  Chest wall: No crepitus, obvious deformity or step-off, TTP to left lower chest wall PULM:  No respiratory distress, CTAB GI/GU:  non-distended, soft, left flank tenderness MSK/SPINE:  No gross deformities, no edema, moves all extremities  SKIN:  no rash, atraumatic   *Additional and/or pertinent findings  included in MDM below   ED Results / Procedures / Treatments   Labs (all labs ordered are listed, but only abnormal results are displayed) Labs Reviewed  COMPREHENSIVE METABOLIC PANEL - Abnormal; Notable for the following components:      Result Value   Sodium 134 (*)    Glucose, Bld 121 (*)    BUN 27 (*)    Creatinine, Ser 1.87 (*)    GFR, Estimated 41 (*)    All other components within normal limits  CBC WITH DIFFERENTIAL/PLATELET  LIPASE, BLOOD  URINALYSIS, ROUTINE W REFLEX MICROSCOPIC    EKG EKG Interpretation  Date/Time:  Monday October 25 2022 13:20:42 EST Ventricular Rate:  83 PR Interval:  183 QRS Duration: 92 QT Interval:  372 QTC Calculation: 438 R Axis:   14 Text Interpretation: Sinus rhythm Consider left atrial enlargement Abnormal R-wave progression, early transition LVH by voltage Confirmed by Garnette Gunner [phone]) on 10/25/2022 3:32:52 PM  Radiology CT Renal Stone Study  Result Date: 10/25/2022 CLINICAL DATA:  57 year old male with history of left-sided abdominal pain and flank pain. EXAM: CT ABDOMEN AND PELVIS WITHOUT CONTRAST TECHNIQUE: Multidetector CT imaging of the abdomen and pelvis was performed following the standard protocol without IV contrast. RADIATION DOSE REDUCTION: This exam was performed according to the departmental dose-optimization program which includes automated exposure control, adjustment of the mA and/or kV according to patient size and/or use of iterative reconstruction technique. COMPARISON:  CT of the abdomen and pelvis 03/15/2019. FINDINGS: Lower chest: Heart size is borderline enlarged. Atherosclerotic calcifications are noted in the left anterior descending, left circumflex and right coronary arteries. Hepatobiliary: No definite suspicious cystic or solid hepatic lesions are confidently identified on today's noncontrast CT examination. Unenhanced appearance of the gallbladder is normal. Pancreas: No pancreatic mass. No pancreatic ductal  dilatation. No pancreatic or peripancreatic fluid collections or inflammatory changes. Spleen: Unremarkable. Adrenals/Urinary Tract: Mild cortical scarring with adjacent parenchymal calcifications in the interpolar region of the left kidney, similar to the prior examination. No other definite calcifications are identified within the collecting system of either kidney, along the course of either ureter, or within the lumen of the urinary bladder to suggest urolithiasis. Right kidney and bilateral adrenal glands are normal in appearance. No hydroureteronephrosis. Urinary bladder is unremarkable in appearance. Stomach/Bowel: Unenhanced appearance of the stomach is normal. There is no pathologic dilatation of small bowel or colon. Numerous colonic diverticuli are noted, particularly in the descending colon and sigmoid colon, without definite focal surrounding inflammatory changes to suggest an acute diverticulitis at this time. Normal appendix. Vascular/Lymphatic: Aortic atherosclerosis. No lymphadenopathy noted in the abdomen or pelvis. Reproductive: Prostate gland and seminal vesicles are unremarkable in appearance. Other: Large umbilical hernia containing only omental fat. No associated bowel incarceration or obstruction at this time. No significant volume of ascites.  No pneumoperitoneum. Musculoskeletal: There are no aggressive appearing lytic or blastic lesions noted in the visualized portions of the skeleton. IMPRESSION: 1. No acute findings are noted in the abdomen or pelvis to account for the patient's symptoms. 2. Large umbilical hernia containing only omental fat. No associated bowel incarceration or obstruction at this time. 3. Extensive colonic diverticulosis without definitive evidence of acute diverticulitis at this time. 4. Aortic atherosclerosis, in addition to at least three-vessel coronary artery disease. Please note that although the presence of coronary artery calcium documents the presence of  coronary artery disease, the severity of this disease and any potential stenosis cannot be assessed on this non-gated CT examination. Assessment for potential risk factor modification, dietary therapy or pharmacologic therapy may be warranted, if clinically indicated. 5. Additional incidental findings, as above. Electronically Signed   By: Vinnie Langton M.D.   On: 10/25/2022 13:18   DG Chest 2 View  Result Date: 10/25/2022 CLINICAL DATA:  Left chest wall pain EXAM: CHEST - 2 VIEW COMPARISON:  Mar 17, 2009 FINDINGS: Nipple shadows project over the bilateral bases. No suspicious pulmonary nodules or masses. No focal infiltrates. No pneumothorax. The cardiomediastinal silhouette is normal. Visualized bones are unremarkable. IMPRESSION: No active cardiopulmonary disease. Electronically Signed   By: Dorise Bullion III M.D.   On: 10/25/2022 12:39    Procedures Procedures    Medications Ordered in ED Medications  HYDROcodone-acetaminophen (NORCO/VICODIN) 5-325 MG per tablet 1 tablet (1 tablet Oral Given 10/25/22 1057)  ondansetron (ZOFRAN-ODT) disintegrating tablet 8 mg (8 mg Oral Given 10/25/22 1057)  HYDROmorphone (DILAUDID) injection 1 mg (1 mg Intravenous Given 10/25/22 1314)    ED Course/ Medical Decision Making/ A&P                           Medical Decision Making Amount and/or Complexity of Data Reviewed Labs: ordered. Radiology: ordered.  Risk Prescription drug management.   Initial Impression and Ddx 57 year old male who is well-appearing and hemodynamically stable presenting for flank pain.  Physical exam notable for left flank and lateral left chest wall tenderness.  Differential diagnosis for this complaint includes rib fracture, nephrolithiasis, pneumothorax and acs. Patient PMH that increases complexity of ED encounter: Hypertension, COPD, CAD, DM.  Interpretation of Diagnostics I independent reviewed and interpreted the labs as followed: Hyponatremia, hyperglycemia, elevated  BUN/Cr  - I independently visualized the following imaging with scope of interpretation limited to determining acute life threatening conditions related to emergency care: CT renal stone study, which revealed extensive colonic diverticulosis, Aortic atherosclerosis, in addition to at least three-vessel CAD  I independently reviewed and interpreted EKG which revealed normal sinus rhythm  Patient Reassessment and Ultimate Disposition/Management Treated pain with Norco and then later with Dilaudid.  After Dilaudid patient stated that his pain is much improved.  Treated nausea with Zofran.  Low suspicion for renal stone given no urinary changes and negative UA but patient continue to endorse flank pain prompting CT renal stone study which was negative for nephrolithiasis.  Did reveal aortic atherosclerosis and three-vessel CAD.  Patient has known CAD and is without chest pain and shortness of breath with this encounter.  Advised patient of these findings and asked him to follow-up with his PCP.  Symptoms could be related to soft tissue injury of some kind but patient continues to deny trauma.  Advised to follow-up with his PCP for ongoing flank pain. Also considered pneumothorax and rib fracture but unlikely given negative chest  x-ray.  Patient management required discussion with the following services or consulting groups:  None  Complexity of Problems Addressed Acute complicated illness or Injury  Additional Data Reviewed and Analyzed Further history obtained from: None  Patient Encounter Risk Assessment None         Final Clinical Impression(s) / ED Diagnoses Final diagnoses:  Flank pain    Rx / DC Orders ED Discharge Orders     None         Harriet Pho, PA-C 10/25/22 1544    Cristie Hem, MD 10/26/22 [phone]

## 2022-10-25 NOTE — ED Triage Notes (Signed)
Patient complaining of left sided abdominal pain that started two days ago. Denies N/V/D/constipation.

## 2022-10-25 NOTE — Discharge Instructions (Addendum)
Evaluation of your flank pain was overall reassuring.  CT scan did reveal evidence of atherosclerosis in your aorta and 3 coronary arteries.  Advise you follow-up with your PCP.  CT scan did not reveal concern for kidney stone.  If you have new chest pain, shortness of breath, bloody urine or painful urination, or any other concerns please return to the emergency department for further evaluation.

## 2022-10-25 NOTE — ED Notes (Signed)
Pt left personal phone in room after he was discharged, nurse called pt's sister who is on the contact list and could not leave a message due to mailbox being full, nurse will leave pt's phone with security.

## 2022-10-25 NOTE — ED Notes (Signed)
Pt came back to the hospital and pt's personal phone was returned back to patient.

## 2022-11-29 ENCOUNTER — Encounter: Payer: Self-pay | Admitting: *Deleted

## 2022-12-22 ENCOUNTER — Ambulatory Visit: Payer: Medicare Other | Admitting: Urology

## 2022-12-22 DIAGNOSIS — N400 Enlarged prostate without lower urinary tract symptoms: Secondary | ICD-10-CM

## 2023-02-15 ENCOUNTER — Encounter: Payer: Self-pay | Admitting: Urology

## 2023-02-15 ENCOUNTER — Ambulatory Visit (INDEPENDENT_AMBULATORY_CARE_PROVIDER_SITE_OTHER): Payer: Medicare Other | Admitting: Urology

## 2023-02-15 VITALS — BP 153/88 | HR 69

## 2023-02-15 DIAGNOSIS — R351 Nocturia: Secondary | ICD-10-CM

## 2023-02-15 DIAGNOSIS — N400 Enlarged prostate without lower urinary tract symptoms: Secondary | ICD-10-CM

## 2023-02-15 DIAGNOSIS — N5201 Erectile dysfunction due to arterial insufficiency: Secondary | ICD-10-CM | POA: Diagnosis not present

## 2023-02-15 LAB — URINALYSIS, ROUTINE W REFLEX MICROSCOPIC
Bilirubin, UA: NEGATIVE
Glucose, UA: NEGATIVE
Ketones, UA: NEGATIVE
Leukocytes,UA: NEGATIVE
Nitrite, UA: NEGATIVE
Protein,UA: NEGATIVE
RBC, UA: NEGATIVE
Specific Gravity, UA: 1.015 (ref 1.005–1.030)
Urobilinogen, Ur: 0.2 mg/dL (ref 0.2–1.0)
pH, UA: 5.5 (ref 5.0–7.5)

## 2023-02-15 MED ORDER — ALFUZOSIN HCL ER 10 MG PO TB24: 10.0000 mg | ORAL_TABLET | Freq: Every day | ORAL | 11 refills | Status: DC

## 2023-02-15 MED ORDER — AMBULATORY NON FORMULARY MEDICATION: 0.2000 mL | 5 refills | Status: DC | PRN

## 2023-02-15 NOTE — Patient Instructions (Signed)
Erectile Dysfunction ?Erectile dysfunction (ED) is the inability to get or keep an erection in order to have sexual intercourse. ED is considered a symptom of an underlying disorder and is not considered a disease. ED may include: ?Inability to get an erection. ?Lack of enough hardness of the erection to allow penetration. ?Loss of erection before sex is finished. ?What are the causes? ?This condition may be caused by: ?Physical causes, such as: ?Artery problems. This may include heart disease, high blood pressure, atherosclerosis, and diabetes. ?Hormonal problems, such as low testosterone. ?Obesity. ?Nerve problems. This may include back or pelvic injuries, multiple sclerosis, Parkinson's disease, spinal cord injury, and stroke. ?Certain medicines, such as: ?Pain relievers. ?Antidepressants. ?Blood pressure medicines and water pills (diuretics). ?Cancer medicines. ?Antihistamines. ?Muscle relaxants. ?Lifestyle factors, such as: ?Use of drugs such as marijuana, cocaine, or opioids. ?Excessive use of alcohol. ?Smoking. ?Lack of physical activity or exercise. ?Psychological causes, such as: ?Anxiety or stress. ?Sadness or depression. ?Exhaustion. ?Fear about sexual performance. ?Guilt. ?What are the signs or symptoms? ?Symptoms of this condition include: ?Inability to get an erection. ?Lack of enough hardness of the erection to allow penetration. ?Loss of the erection before sex is finished. ?Sometimes having normal erections, but with frequent unsatisfactory episodes. ?Low sexual satisfaction in either partner due to erection problems. ?A curved penis occurring with erection. The curve may cause pain, or the penis may be too curved to allow for intercourse. ?Never having nighttime or morning erections. ?How is this diagnosed? ?This condition is often diagnosed by: ?Performing a physical exam to find other diseases or specific problems with the penis. ?Asking you detailed questions about the problem. ?Doing tests,  such as: ?Blood tests to check for diabetes mellitus or high cholesterol, or to measure hormone levels. ?Other tests to check for underlying health conditions. ?An ultrasound exam to check for scarring. ?A test to check blood flow to the penis. ?Doing a sleep study at home to measure nighttime erections. ?How is this treated? ?This condition may be treated by: ?Medicines, such as: ?Medicine taken by mouth to help you achieve an erection (oral medicine). ?Hormone replacement therapy to replace low testosterone levels. ?Medicine that is injected into the penis. Your health care provider may instruct you how to give yourself these injections at home. ?Medicine that is delivered with a short applicator tube. The tube is inserted into the opening at the tip of the penis, which is the opening of the urethra. A tiny pellet of medicine is put in the urethra. The pellet dissolves and enhances erectile function. This is also called MUSE (medicated urethral system for erections) therapy. ?Vacuum pump. This is a pump with a ring on it. The pump and ring are placed on the penis and used to create pressure that helps the penis become erect. ?Penile implant surgery. In this procedure, you may receive: ?An inflatable implant. This consists of cylinders, a pump, and a reservoir. The cylinders can be inflated with a fluid that helps to create an erection, and they can be deflated after intercourse. ?A semi-rigid implant. This consists of two silicone rubber rods. The rods provide some rigidity. They are also flexible, so the penis can both curve downward in its normal position and become straight for sexual intercourse. ?Blood vessel surgery to improve blood flow to the penis. During this procedure, a blood vessel from a different part of the body is placed into the penis to allow blood to flow around (bypass) damaged or blocked blood vessels. ?Lifestyle changes,   such as exercising more, losing weight, and quitting smoking. ?Follow  these instructions at home: ?Medicines ? ?Take over-the-counter and prescription medicines only as told by your health care provider. Do not increase the dosage without first discussing it with your health care provider. ?If you are using self-injections, do injections as directed by your health care provider. Make sure you avoid any veins that are on the surface of the penis. After giving an injection, apply pressure to the injection site for 5 minutes. ?Talk to your health care provider about how to prevent headaches while taking ED medicines. These medicines may cause a sudden headache due to the increase in blood flow in your body. ?General instructions ?Exercise regularly, as directed by your health care provider. Work with your health care provider to lose weight, if needed. ?Do not use any products that contain nicotine or tobacco. These products include cigarettes, chewing tobacco, and vaping devices, such as e-cigarettes. If you need help quitting, ask your health care provider. ?Before using a vacuum pump, read the instructions that come with the pump and discuss any questions with your health care provider. ?Keep all follow-up visits. This is important. ?Contact a health care provider if: ?You feel nauseous. ?You are vomiting. ?You get sudden headaches while taking ED medicines. ?You have any concerns about your sexual health. ?Get help right away if: ?You are taking oral or injectable medicines and you have an erection that lasts longer than 4 hours. If your health care provider is unavailable, go to the nearest emergency room for evaluation. An erection that lasts much longer than 4 hours can result in permanent damage to your penis. ?You have severe pain in your groin or abdomen. ?You develop redness or severe swelling of your penis. ?You have redness spreading at your groin or lower abdomen. ?You are unable to urinate. ?You experience chest pain or a rapid heartbeat (palpitations) after taking oral  medicines. ?These symptoms may represent a serious problem that is an emergency. Do not wait to see if the symptoms will go away. Get medical help right away. Call your local emergency services (911 in the [place] Do not drive yourself to the hospital. ?Summary ?Erectile dysfunction (ED) is the inability to get or keep an erection during sexual intercourse. ?This condition is diagnosed based on a physical exam, your symptoms, and tests to determine the cause. Treatment varies depending on the cause and may include medicines, hormone therapy, surgery, or a vacuum pump. ?You may need follow-up visits to make sure that you are using your medicines or devices correctly. ?Get help right away if you are taking or injecting medicines and you have an erection that lasts longer than 4 hours. ?This information is not intended to replace advice given to you by your health care provider. Make sure you discuss any questions you have with your health care provider. ?Document Revised: 01/07/2021 Document Reviewed: 01/07/2021 ?Elsevier Patient Education ? 2023 [employer] ? ?

## 2023-02-15 NOTE — Progress Notes (Signed)
02/15/2023 1:29 PM   Cody Shepard [birth year] [id number]  Referring provider: Alvina Filbert, MD [address] [place],  [place] [address]  Erectile dysfunction and nocturia   HPI: Cody Shepard is a 57yo here for evaluation of nocturia and erectile dysfunction. IPSS 10 QOL 4 on no therapy. Nocturia 4-5x. [place] fair. No straining to urinate. For the past year he has noted difficulty getting and maintaining and erection. He has CHF and is on Imdur. He has not taken nitrostat in over 6 months. He has had CHF for over 10 years. EF is 50%. He had DMII.    PMH: Past Medical History:  Diagnosis Date   Asthma    Bronchitis    COPD (chronic obstructive pulmonary disease) (HCC)    Coronary artery disease    Diabetes mellitus without complication (HCC)    Headache    High cholesterol    Hypertension     Surgical History: Past Surgical History:  Procedure Laterality Date   INGUINAL HERNIA REPAIR Left 01/16/2020   Procedure: HERNIA REPAIR INGUINAL ADULT;  Surgeon: Franky Macho, MD;  Location: [facility];  Service: General;  Laterality: Left;   LAPAROSCOPIC ADRENALECTOMY Left 01/04/2019   Procedure: LAPAROSCOPIC LEFT ADRENALECTOMY;  Surgeon: Darnell Level, MD;  Location: [facility];  Service: General;  Laterality: Left;    Home Medications:  Allergies as of 02/15/2023   No Known Allergies      Medication List        Accurate as of February 15, 2023  1:29 PM. If you have any questions, ask your nurse or doctor.          albuterol 108 (90 Base) MCG/ACT inhaler Commonly known as: VENTOLIN HFA Inhale 2 puffs into the lungs every 4 (four) hours as needed for wheezing or shortness of breath.   amoxicillin 500 MG capsule Commonly known as: AMOXIL Take 1 capsule (500 mg total) by mouth 3 (three) times daily.   atorvastatin 10 MG tablet Commonly known as: LIPITOR Take 10 mg by mouth daily.   butalbital-acetaminophen-caffeine 50-325-40 MG tablet Commonly known as: FIORICET Take  1 tablet by mouth every 6 (six) hours as needed for headache.   carvedilol 25 MG tablet Commonly known as: COREG Take 25 mg by mouth 2 (two) times daily with a meal. Breakfast & supper   cloNIDine 0.1 mg/24hr patch Commonly known as: CATAPRES - Dosed in mg/24 hr Place 0.1 mg onto the skin every Sunday.   diclofenac Sodium 1 % Gel Commonly known as: VOLTAREN Apply 1 g topically 4 (four) times daily as needed (pain.).   fluticasone 50 MCG/ACT nasal spray Commonly known as: FLONASE Place 2 sprays into both nostrils daily.   hydrALAZINE 50 MG tablet Commonly known as: APRESOLINE Take 50 mg by mouth in the morning and at bedtime.   HYDROcodone-acetaminophen 5-325 MG tablet Commonly known as: Norco Take 1 tablet by mouth every 4 (four) hours as needed for moderate pain.   isosorbide mononitrate 60 MG 24 hr tablet Commonly known as: IMDUR Take 60 mg by mouth in the morning and at bedtime.   metFORMIN 500 MG tablet Commonly known as: GLUCOPHAGE Take 500 mg by mouth 2 (two) times daily.   nitroGLYCERIN 0.4 MG SL tablet Commonly known as: NITROSTAT Place 0.4 mg under the tongue every 5 (five) minutes x 3 doses as needed for chest pain.   olmesartan 20 MG tablet Commonly known as: BENICAR Take 20 mg by mouth daily.   ranolazine 500  MG 12 hr tablet Commonly known as: RANEXA Take 500 mg by mouth 2 (two) times daily.   Trelegy Ellipta 100-62.5-25 MCG/INH Aepb Generic drug: Fluticasone-Umeclidin-Vilant Inhale 1 Inhaler into the lungs daily.   vitamin C 250 MG tablet Commonly known as: ASCORBIC ACID Take 250 mg by mouth daily.        Allergies: No Known Allergies  Family History: Family History  Problem Relation Age of Onset   Diabetes Mother    Hypertension Mother    Stroke Father     Social History:  reports that he quit smoking about 25 years ago. His smoking use included cigarettes. He smoked an average of 1 pack per day. He has never used smokeless tobacco. He  reports that he does not drink alcohol and does not use drugs.  ROS: All other review of systems were reviewed and are negative except what is noted above in HPI  Physical Exam: BP (!) 153/88   Pulse 69   Constitutional:  Alert and oriented, No acute distress. HEENT: [place] AT, moist mucus membranes.  Trachea midline, no masses. Cardiovascular: No clubbing, cyanosis, or edema. Respiratory: Normal respiratory effort, no increased work of breathing. GI: Abdomen is soft, nontender, nondistended, no abdominal masses GU: No CVA tenderness. Circumcised phallus. No masses/lesions on penis, testis, scrotum. Prostate 40g smooth no nodules no induration.  Lymph: No cervical or inguinal lymphadenopathy. Skin: No rashes, bruises or suspicious lesions. Neurologic: Grossly intact, no focal deficits, moving all 4 extremities. Psychiatric: Normal mood and affect.  Laboratory Data: Lab Results  Component Value Date   WBC 4.2 10/25/2022   HGB 14.0 10/25/2022   HCT 42.4 10/25/2022   MCV 95.1 10/25/2022   PLT 241 10/25/2022    Lab Results  Component Value Date   CREATININE 1.87 (H) 10/25/2022    No results found for: "PSA"  No results found for: "TESTOSTERONE"  Lab Results  Component Value Date   HGBA1C 6.3 (H) 01/14/2020    Urinalysis    Component Value Date/Time   COLORURINE YELLOW 10/25/2022 1112   APPEARANCEUR CLEAR 10/25/2022 1112   LABSPEC 1.010 10/25/2022 1112   PHURINE 5.0 10/25/2022 1112   GLUCOSEU NEGATIVE 10/25/2022 1112   HGBUR NEGATIVE 10/25/2022 1112   BILIRUBINUR NEGATIVE 10/25/2022 1112   KETONESUR NEGATIVE 10/25/2022 1112   PROTEINUR NEGATIVE 10/25/2022 1112   NITRITE NEGATIVE 10/25/2022 1112   LEUKOCYTESUR NEGATIVE 10/25/2022 1112    No results found for: "LABMICR", "WBCUA", "RBCUA", "LABEPIT", "MUCUS", "BACTERIA"  Pertinent Imaging:  Results for orders placed during the hospital encounter of 03/09/19  DG Abd 1 View  Narrative CLINICAL DATA:  Laparoscopic  LEFT adrenalectomy in March, 2020, with intermittent constipation since that time and acute onset of generalized abdominal pain that began 3 days ago.  EXAM: ABDOMEN - 1 VIEW  COMPARISON:  CT abdomen and pelvis 09/28/2018.  FINDINGS: Bowel gas pattern unremarkable without evidence of obstruction or significant ileus. Large stool burden in the ascending and transverse colon. The LEFT renal calculus identified on the prior CT is not visible on the current x-ray. Surgical clips to the LEFT of midline at the T12 level at the site of the prior adrenalectomy. Mild degenerative changes involving lumbar spine.  IMPRESSION: No acute abdominal abnormality. Large stool burden in the ascending and transverse colon.   Electronically Signed By: Hulan Saas M.D. On: 03/11/2019 13:41  No results found for this or any previous visit.  No results found for this or any previous visit.  No results  found for this or any previous visit.  No results found for this or any previous visit.  No valid procedures specified. No results found for this or any previous visit.  Results for orders placed during the hospital encounter of 10/25/22  CT Renal Stone Study  Narrative CLINICAL DATA:  57 year old male with history of left-sided abdominal pain and flank pain.  EXAM: CT ABDOMEN AND PELVIS WITHOUT CONTRAST  TECHNIQUE: Multidetector CT imaging of the abdomen and pelvis was performed following the standard protocol without IV contrast.  RADIATION DOSE REDUCTION: This exam was performed according to the departmental dose-optimization program which includes automated exposure control, adjustment of the mA and/or kV according to patient size and/or use of iterative reconstruction technique.  COMPARISON:  CT of the abdomen and pelvis 03/15/2019.  FINDINGS: Lower chest: Heart size is borderline enlarged. Atherosclerotic calcifications are noted in the left anterior descending,  left circumflex and right coronary arteries.  Hepatobiliary: No definite suspicious cystic or solid hepatic lesions are confidently identified on today's noncontrast CT examination. Unenhanced appearance of the gallbladder is normal.  Pancreas: No pancreatic mass. No pancreatic ductal dilatation. No pancreatic or peripancreatic fluid collections or inflammatory changes.  Spleen: Unremarkable.  Adrenals/Urinary Tract: Mild cortical scarring with adjacent parenchymal calcifications in the interpolar region of the left kidney, similar to the prior examination. No other definite calcifications are identified within the collecting system of either kidney, along the course of either ureter, or within the lumen of the urinary bladder to suggest urolithiasis. Right kidney and bilateral adrenal glands are normal in appearance. No hydroureteronephrosis. Urinary bladder is unremarkable in appearance.  Stomach/Bowel: Unenhanced appearance of the stomach is normal. There is no pathologic dilatation of small bowel or colon. Numerous colonic diverticuli are noted, particularly in the descending colon and sigmoid colon, without definite focal surrounding inflammatory changes to suggest an acute diverticulitis at this time. Normal appendix.  Vascular/Lymphatic: Aortic atherosclerosis. No lymphadenopathy noted in the abdomen or pelvis.  Reproductive: Prostate gland and seminal vesicles are unremarkable in appearance.  Other: Large umbilical hernia containing only omental fat. No associated bowel incarceration or obstruction at this time. No significant volume of ascites. No pneumoperitoneum.  Musculoskeletal: There are no aggressive appearing lytic or blastic lesions noted in the visualized portions of the skeleton.  IMPRESSION: 1. No acute findings are noted in the abdomen or pelvis to account for the patient's symptoms. 2. Large umbilical hernia containing only omental fat. No  associated bowel incarceration or obstruction at this time. 3. Extensive colonic diverticulosis without definitive evidence of acute diverticulitis at this time. 4. Aortic atherosclerosis, in addition to at least three-vessel coronary artery disease. Please note that although the presence of coronary artery calcium documents the presence of coronary artery disease, the severity of this disease and any potential stenosis cannot be assessed on this non-gated CT examination. Assessment for potential risk factor modification, dietary therapy or pharmacologic therapy may be warranted, if clinically indicated. 5. Additional incidental findings, as above.   Electronically Signed By: Trudie Reed M.D. On: 10/25/2022 13:18   Assessment & Plan:    1. Benign prostatic hyperplasia, unspecified whether lower urinary tract symptoms present -We will trial uroxatral - Urinalysis, Routine w reflex microscopic  2. Erectile dysfunction due to arterial insufficiency We discussed VED, ICI and Muse. After discussing the options the patient elects for Trimix injections  3. Nocturia -we will trial uroxatral 10mg  qhs   No follow-ups on file.  Wilkie Aye, MD  [facility]

## 2023-03-23 ENCOUNTER — Ambulatory Visit (INDEPENDENT_AMBULATORY_CARE_PROVIDER_SITE_OTHER): Payer: Medicare Other | Admitting: Urology

## 2023-03-23 ENCOUNTER — Encounter: Payer: Self-pay | Admitting: Urology

## 2023-03-23 VITALS — BP 120/70 | HR 83

## 2023-03-23 DIAGNOSIS — N5201 Erectile dysfunction due to arterial insufficiency: Secondary | ICD-10-CM | POA: Diagnosis not present

## 2023-03-23 DIAGNOSIS — N400 Enlarged prostate without lower urinary tract symptoms: Secondary | ICD-10-CM

## 2023-03-23 DIAGNOSIS — R351 Nocturia: Secondary | ICD-10-CM

## 2023-03-23 LAB — URINALYSIS, ROUTINE W REFLEX MICROSCOPIC
Bilirubin, UA: NEGATIVE
Glucose, UA: NEGATIVE
Ketones, UA: NEGATIVE
Leukocytes,UA: NEGATIVE
Nitrite, UA: NEGATIVE
Protein,UA: NEGATIVE
RBC, UA: NEGATIVE
Specific Gravity, UA: 1.025 (ref 1.005–1.030)
Urobilinogen, Ur: 0.2 mg/dL (ref 0.2–1.0)
pH, UA: 5.5 (ref 5.0–7.5)

## 2023-03-23 NOTE — Progress Notes (Unsigned)
03/23/2023 1:37 PM   Cody Shepard 1966/06/29 [id number]  Referring provider: Alvina Filbert, MD [address] [place],  [place] [address]  Chief Complaint  Patient presents with   trimix teaching    HPI: Cody Shepard is a 57yo here for followup for BPh and trimix teaching. IPSS 4 QOl 1 on uroxatral 10mg  daily. Urine stream strong. No straining to urinate. Nocturia 1x. No other complaints   PMH: Past Medical History:  Diagnosis Date   Asthma    Bronchitis    COPD (chronic obstructive pulmonary disease) (HCC)    Coronary artery disease    Diabetes mellitus without complication (HCC)    Headache    High cholesterol    Hypertension     Surgical History: Past Surgical History:  Procedure Laterality Date   INGUINAL HERNIA REPAIR Left 01/16/2020   Procedure: HERNIA REPAIR INGUINAL ADULT;  Surgeon: Franky Macho, MD;  Location: [facility];  Service: General;  Laterality: Left;   LAPAROSCOPIC ADRENALECTOMY Left 01/04/2019   Procedure: LAPAROSCOPIC LEFT ADRENALECTOMY;  Surgeon: Darnell Level, MD;  Location: [facility];  Service: General;  Laterality: Left;    Home Medications:  Allergies as of 03/23/2023   No Known Allergies      Medication List        Accurate as of Mar 23, 2023  1:37 PM. If you have any questions, ask your nurse or doctor.          STOP taking these medications    amoxicillin 500 MG capsule Commonly known as: AMOXIL Stopped by: Wilkie Aye, MD       TAKE these medications    albuterol 108 (90 Base) MCG/ACT inhaler Commonly known as: VENTOLIN HFA Inhale 2 puffs into the lungs every 4 (four) hours as needed for wheezing or shortness of breath.   alfuzosin 10 MG 24 hr tablet Commonly known as: UROXATRAL Take 1 tablet (10 mg total) by mouth at bedtime.   AMBULATORY NON FORMULARY MEDICATION 0.2 mLs by Intracavernosal route as needed. Medication Name: Trimix  PGE Pap 30mg  Phent 1mg    atorvastatin 10 MG tablet Commonly known as:  LIPITOR Take 10 mg by mouth daily.   butalbital-acetaminophen-caffeine 50-325-40 MG tablet Commonly known as: FIORICET Take 1 tablet by mouth every 6 (six) hours as needed for headache.   carvedilol 25 MG tablet Commonly known as: COREG Take 25 mg by mouth 2 (two) times daily with a meal. Breakfast & supper   cloNIDine 0.1 mg/24hr patch Commonly known as: CATAPRES - Dosed in mg/24 hr Place 0.1 mg onto the skin every Sunday.   diclofenac Sodium 1 % Gel Commonly known as: VOLTAREN Apply 1 g topically 4 (four) times daily as needed (pain.).   fluticasone 50 MCG/ACT nasal spray Commonly known as: FLONASE Place 2 sprays into both nostrils daily.   hydrALAZINE 50 MG tablet Commonly known as: APRESOLINE Take 50 mg by mouth in the morning and at bedtime.   HYDROcodone-acetaminophen 5-325 MG tablet Commonly known as: Norco Take 1 tablet by mouth every 4 (four) hours as needed for moderate pain.   isosorbide mononitrate 60 MG 24 hr tablet Commonly known as: IMDUR Take 60 mg by mouth in the morning and at bedtime.   metFORMIN 500 MG tablet Commonly known as: GLUCOPHAGE Take 500 mg by mouth 2 (two) times daily.   nitroGLYCERIN 0.4 MG SL tablet Commonly known as: NITROSTAT Place 0.4 mg under the tongue every 5 (five) minutes x 3 doses as needed for  chest pain.   olmesartan 20 MG tablet Commonly known as: BENICAR Take 20 mg by mouth daily.   ranolazine 500 MG 12 hr tablet Commonly known as: RANEXA Take 500 mg by mouth 2 (two) times daily.   Trelegy Ellipta 100-62.5-25 MCG/INH Aepb Generic drug: Fluticasone-Umeclidin-Vilant Inhale 1 Inhaler into the lungs daily.   vitamin C 250 MG tablet Commonly known as: ASCORBIC ACID Take 250 mg by mouth daily.        Allergies: No Known Allergies  Family History: Family History  Problem Relation Age of Onset   Diabetes Mother    Hypertension Mother    Stroke Father     Social History:  reports that he quit smoking about  25 years ago. His smoking use included cigarettes. He smoked an average of 1 pack per day. He has never used smokeless tobacco. He reports that he does not drink alcohol and does not use drugs.  ROS: All other review of systems were reviewed and are negative except what is noted above in HPI  Physical Exam: BP 120/70   Pulse 83   Constitutional:  Alert and oriented, No acute distress. HEENT: Cody Shepard AT, moist mucus membranes.  Trachea midline, no masses. Cardiovascular: No clubbing, cyanosis, or edema. Respiratory: Normal respiratory effort, no increased work of breathing. GI: Abdomen is soft, nontender, nondistended, no abdominal masses GU: No CVA tenderness.  Lymph: No cervical or inguinal lymphadenopathy. Skin: No rashes, bruises or suspicious lesions. Neurologic: Grossly intact, no focal deficits, moving all 4 extremities. Psychiatric: Normal mood and affect.  Laboratory Data: Lab Results  Component Value Date   WBC 4.2 10/25/2022   HGB 14.0 10/25/2022   HCT 42.4 10/25/2022   MCV 95.1 10/25/2022   PLT 241 10/25/2022    Lab Results  Component Value Date   CREATININE 1.87 (H) 10/25/2022    No results found for: "PSA"  No results found for: "TESTOSTERONE"  Lab Results  Component Value Date   HGBA1C 6.3 (H) 01/14/2020    Urinalysis    Component Value Date/Time   COLORURINE YELLOW 10/25/2022 1112   APPEARANCEUR Clear 02/15/2023 1313   LABSPEC 1.010 10/25/2022 1112   PHURINE 5.0 10/25/2022 1112   GLUCOSEU Negative 02/15/2023 1313   HGBUR NEGATIVE 10/25/2022 1112   BILIRUBINUR Negative 02/15/2023 1313   KETONESUR NEGATIVE 10/25/2022 1112   PROTEINUR Negative 02/15/2023 1313   PROTEINUR NEGATIVE 10/25/2022 1112   NITRITE Negative 02/15/2023 1313   NITRITE NEGATIVE 10/25/2022 1112   LEUKOCYTESUR Negative 02/15/2023 1313   LEUKOCYTESUR NEGATIVE 10/25/2022 1112    Lab Results  Component Value Date   LABMICR Comment 02/15/2023    Pertinent Imaging:  Results for  orders placed during the hospital encounter of 03/09/19  DG Abd 1 View  Narrative CLINICAL DATA:  Laparoscopic LEFT adrenalectomy in March, 2020, with intermittent constipation since that time and acute onset of generalized abdominal pain that began 3 days ago.  EXAM: ABDOMEN - 1 VIEW  COMPARISON:  CT abdomen and pelvis 09/28/2018.  FINDINGS: Bowel gas pattern unremarkable without evidence of obstruction or significant ileus. Large stool burden in the ascending and transverse colon. The LEFT renal calculus identified on the prior CT is not visible on the current x-ray. Surgical clips to the LEFT of midline at the T12 level at the site of the prior adrenalectomy. Mild degenerative changes involving lumbar spine.  IMPRESSION: No acute abdominal abnormality. Large stool burden in the ascending and transverse colon.   Electronically Signed By: Hulan Saas  M.D. On: 03/11/2019 13:41  No results found for this or any previous visit.  No results found for this or any previous visit.  No results found for this or any previous visit.  No results found for this or any previous visit.  No valid procedures specified. No results found for this or any previous visit.  Results for orders placed during the hospital encounter of 10/25/22  CT Renal Stone Study  Narrative CLINICAL DATA:  57 year old male with history of left-sided abdominal pain and flank pain.  EXAM: CT ABDOMEN AND PELVIS WITHOUT CONTRAST  TECHNIQUE: Multidetector CT imaging of the abdomen and pelvis was performed following the standard protocol without IV contrast.  RADIATION DOSE REDUCTION: This exam was performed according to the departmental dose-optimization program which includes automated exposure control, adjustment of the mA and/or kV according to patient size and/or use of iterative reconstruction technique.  COMPARISON:  CT of the abdomen and pelvis 03/15/2019.  FINDINGS: Lower chest:  Heart size is borderline enlarged. Atherosclerotic calcifications are noted in the left anterior descending, left circumflex and right coronary arteries.  Hepatobiliary: No definite suspicious cystic or solid hepatic lesions are confidently identified on today's noncontrast CT examination. Unenhanced appearance of the gallbladder is normal.  Pancreas: No pancreatic mass. No pancreatic ductal dilatation. No pancreatic or peripancreatic fluid collections or inflammatory changes.  Spleen: Unremarkable.  Adrenals/Urinary Tract: Mild cortical scarring with adjacent parenchymal calcifications in the interpolar region of the left kidney, similar to the prior examination. No other definite calcifications are identified within the collecting system of either kidney, along the course of either ureter, or within the lumen of the urinary bladder to suggest urolithiasis. Right kidney and bilateral adrenal glands are normal in appearance. No hydroureteronephrosis. Urinary bladder is unremarkable in appearance.  Stomach/Bowel: Unenhanced appearance of the stomach is normal. There is no pathologic dilatation of small bowel or colon. Numerous colonic diverticuli are noted, particularly in the descending colon and sigmoid colon, without definite focal surrounding inflammatory changes to suggest an acute diverticulitis at this time. Normal appendix.  Vascular/Lymphatic: Aortic atherosclerosis. No lymphadenopathy noted in the abdomen or pelvis.  Reproductive: Prostate gland and seminal vesicles are unremarkable in appearance.  Other: Large umbilical hernia containing only omental fat. No associated bowel incarceration or obstruction at this time. No significant volume of ascites. No pneumoperitoneum.  Musculoskeletal: There are no aggressive appearing lytic or blastic lesions noted in the visualized portions of the skeleton.  IMPRESSION: 1. No acute findings are noted in the abdomen or  pelvis to account for the patient's symptoms. 2. Large umbilical hernia containing only omental fat. No associated bowel incarceration or obstruction at this time. 3. Extensive colonic diverticulosis without definitive evidence of acute diverticulitis at this time. 4. Aortic atherosclerosis, in addition to at least three-vessel coronary artery disease. Please note that although the presence of coronary artery calcium documents the presence of coronary artery disease, the severity of this disease and any potential stenosis cannot be assessed on this non-gated CT examination. Assessment for potential risk factor modification, dietary therapy or pharmacologic therapy may be warranted, if clinically indicated. 5. Additional incidental findings, as above.   Electronically Signed By: Trudie Reed M.D. On: 10/25/2022 13:18  Trimix injection instruction:  Patient instructed to draw 0.63ml of trimix into the insulin syringe. I them instructed him to inject at the mid penile shaft at either the 3 or 9 o'clock position. I then injected the patient and he achieved a good erection in 20 minutes. Penile  injection completed.   Assessment & Plan:    1. Benign prostatic hyperplasia, unspecified whether lower urinary tract symptoms present Uroxatral 10mg  qhs - Urinalysis, Routine w reflex microscopic  2. Nocturia Uroxatral 10mg  qhs  3. Erectile dysfunction due to arterial insufficiency The patient was instructed on proper technique for trimix injection. He is instructed to alternate side and location of the injection. He was instructed in titrating the trimix dose. He is instructed to call the office for an erection lasting more than 4 hours    No follow-ups on file.  Wilkie Aye, MD  [facility] [place]

## 2023-03-24 ENCOUNTER — Encounter: Payer: Self-pay | Admitting: Urology

## 2023-03-24 NOTE — Patient Instructions (Signed)

## 2023-04-27 DIAGNOSIS — I5031 Acute diastolic (congestive) heart failure: Secondary | ICD-10-CM | POA: Insufficient documentation

## 2023-04-27 DIAGNOSIS — N401 Enlarged prostate with lower urinary tract symptoms: Secondary | ICD-10-CM | POA: Insufficient documentation

## 2023-06-14 ENCOUNTER — Encounter: Payer: Self-pay | Admitting: *Deleted

## 2023-06-23 ENCOUNTER — Telehealth: Payer: Self-pay | Admitting: Urology

## 2023-06-23 NOTE — Telephone Encounter (Signed)
Dr Wolfgang Phoenix office called said we put in a referral for the patient to be seen by them and they need his demographics and insurance cards.    Fax [phone]

## 2023-06-24 NOTE — Telephone Encounter (Signed)
Insurance cards and demographics faxed to Dr. Wolfgang Phoenix office.

## 2023-07-08 ENCOUNTER — Encounter (HOSPITAL_COMMUNITY)
Admission: RE | Admit: 2023-07-08 | Discharge: 2023-07-08 | Disposition: A | Discharge status: Home / Self Care | Payer: Medicare Other | Source: Ambulatory Visit | Attending: Thoracic Surgery (Cardiothoracic Vascular Surgery) | Admitting: Thoracic Surgery (Cardiothoracic Vascular Surgery)

## 2023-07-08 DIAGNOSIS — Z951 Presence of aortocoronary bypass graft: Secondary | ICD-10-CM | POA: Insufficient documentation

## 2023-07-13 ENCOUNTER — Encounter (HOSPITAL_COMMUNITY)
Admission: RE | Admit: 2023-07-13 | Discharge: 2023-07-13 | Disposition: A | Discharge status: Home / Self Care | Payer: Medicare Other | Source: Ambulatory Visit | Attending: Thoracic Surgery (Cardiothoracic Vascular Surgery) | Admitting: Thoracic Surgery (Cardiothoracic Vascular Surgery)

## 2023-07-13 VITALS — Ht 66.0 in | Wt 191.8 lb

## 2023-07-13 DIAGNOSIS — Z951 Presence of aortocoronary bypass graft: Secondary | ICD-10-CM | POA: Diagnosis present

## 2023-07-13 NOTE — Patient Instructions (Signed)
Patient Instructions  Patient Details  Name: Cody Shepard MRN: [id number] Date of Birth: [birth year] Referring Provider:  Iverson Alamin  Below are your personal goals for exercise, nutrition, and risk factors. Our goal is to help you stay on track towards obtaining and maintaining these goals. We will be discussing your progress on these goals with you throughout the program.  Initial Exercise Prescription:  Initial Exercise Prescription - 07/13/23 0900       Date of Initial Exercise RX and Referring Provider   Date 07/13/23    Referring Provider Dr. Mayford Knife      Oxygen   Maintain Oxygen Saturation 88% or higher      Treadmill   MPH 2    Grade 0    Minutes 15      Elliptical   Level 2    Speed 50    Minutes 15      Prescription Details   Frequency (times per week) 3    Duration Progress to 30 minutes of continuous aerobic without signs/symptoms of physical distress      Intensity   Ratings of Perceived Exertion 11-13      Resistance Training   Training Prescription Yes    Weight 4    Reps 10-15             Exercise Goals: Frequency: Be able to perform aerobic exercise two to three times per week in program working toward 2-5 days per week of home exercise.  Intensity: Work with a perceived exertion of 11 (fairly light) - 15 (hard) while following your exercise prescription.  We will make changes to your prescription with you as you progress through the program.   Duration: Be able to do 30 to 45 minutes of continuous aerobic exercise in addition to a 5 minute warm-up and a 5 minute cool-down routine.   Nutrition Goals: Your personal nutrition goals will be established when you do your nutrition analysis with the dietician.  The following are general nutrition guidelines to follow: Cholesterol < 200mg /day Sodium < 1500mg /day Fiber: Men over 50 yrs - 30 grams per day  Personal Goals:  Personal Goals and Risk Factors at Admission - 07/08/23 0938        Core Components/Risk Factors/Patient Goals on Admission    Weight Management Weight Maintenance    Improve shortness of breath with ADL's Yes    Intervention Provide education, individualized exercise plan and daily activity instruction to help decrease symptoms of SOB with activities of daily living.    Expected Outcomes Short Term: Improve cardiorespiratory fitness to achieve a reduction of symptoms when performing ADLs;Long Term: Be able to perform more ADLs without symptoms or delay the onset of symptoms    Diabetes Yes    Intervention Provide education about signs/symptoms and action to take for hypo/hyperglycemia.;Provide education about proper nutrition, including hydration, and aerobic/resistive exercise prescription along with prescribed medications to achieve blood glucose in normal ranges: Fasting glucose 65-99 mg/dL    Expected Outcomes Short Term: Participant verbalizes understanding of the signs/symptoms and immediate care of hyper/hypoglycemia, proper foot care and importance of medication, aerobic/resistive exercise and nutrition plan for blood glucose control.;Long Term: Attainment of HbA1C < 7%.    Hypertension Yes    Intervention Monitor prescription use compliance.;Provide education on lifestyle modifcations including regular physical activity/exercise, weight management, moderate sodium restriction and increased consumption of fresh fruit, vegetables, and low fat dairy, alcohol moderation, and smoking cessation.    Expected Outcomes Long Term: Maintenance of  blood pressure at goal levels.;Short Term: Continued assessment and intervention until BP is < 140/29mm HG in hypertensive participants. < 130/58mm HG in hypertensive participants with diabetes, heart failure or chronic kidney disease.    Lipids Yes    Intervention Provide education and support for participant on nutrition & aerobic/resistive exercise along with prescribed medications to achieve LDL 70mg , HDL >40mg .     Expected Outcomes Short Term: Participant states understanding of desired cholesterol values and is compliant with medications prescribed. Participant is following exercise prescription and nutrition guidelines.;Long Term: Cholesterol controlled with medications as prescribed, with individualized exercise RX and with personalized nutrition plan. Value goals: LDL < 70mg , HDL > 40 mg.             Tobacco Use Initial Evaluation: Social History   Tobacco Use  Smoking Status Former   Current packs/day: 0.00   Types: Cigarettes   Quit date: 08/30/1997   Years since quitting: 25.8  Smokeless Tobacco Never    Exercise Goals and Review:  Exercise Goals     Row Name 07/13/23 0916             Exercise Goals   Increase Physical Activity Yes       Intervention Provide advice, education, support and counseling about physical activity/exercise needs.;Develop an individualized exercise prescription for aerobic and resistive training based on initial evaluation findings, risk stratification, comorbidities and participant's personal goals.       Expected Outcomes Short Term: Attend rehab on a regular basis to increase amount of physical activity.;Long Term: Add in home exercise to make exercise part of routine and to increase amount of physical activity.;Long Term: Exercising regularly at least 3-5 days a week.       Increase Strength and Stamina Yes       Intervention Provide advice, education, support and counseling about physical activity/exercise needs.;Develop an individualized exercise prescription for aerobic and resistive training based on initial evaluation findings, risk stratification, comorbidities and participant's personal goals.       Expected Outcomes Short Term: Increase workloads from initial exercise prescription for resistance, speed, and METs.;Short Term: Perform resistance training exercises routinely during rehab and add in resistance training at home;Long Term: Improve  cardiorespiratory fitness, muscular endurance and strength as measured by increased METs and functional capacity ( )       Able to understand and use rate of perceived exertion (RPE) scale Yes       Intervention Provide education and explanation on how to use RPE scale       Expected Outcomes Short Term: Able to use RPE daily in rehab to express subjective intensity level;Long Term:  Able to use RPE to guide intensity level when exercising independently       Knowledge and understanding of Target Heart Rate Range (THRR) Yes       Intervention Provide education and explanation of THRR including how the numbers were predicted and where they are located for reference       Expected Outcomes Short Term: Able to state/look up THRR;Long Term: Able to use THRR to govern intensity when exercising independently;Short Term: Able to use daily as guideline for intensity in rehab       Able to check pulse independently Yes       Intervention Provide education and demonstration on how to check pulse in carotid and radial arteries.;Review the importance of being able to check your own pulse for safety during independent exercise       Expected Outcomes  Short Term: Able to explain why pulse checking is important during independent exercise;Long Term: Able to check pulse independently and accurately       Understanding of Exercise Prescription Yes       Intervention Provide education, explanation, and written materials on patient's individual exercise prescription       Expected Outcomes Short Term: Able to explain program exercise prescription;Long Term: Able to explain home exercise prescription to exercise independently                Copy of goals given to participant.

## 2023-07-13 NOTE — Progress Notes (Signed)
Cardiac Individual Treatment Plan  Patient Details  Name: Cody Shepard MRN: [id number] Date of Birth: [birth year] Referring Provider:   Flowsheet Row CARDIAC REHAB PHASE II ORIENTATION from 07/13/2023 in [facility] CARDIAC REHABILITATION  Referring Provider Dr. Mayford Knife       Initial Encounter Date:  Flowsheet Row CARDIAC REHAB PHASE II ORIENTATION from 07/13/2023 in [place] CARDIAC REHABILITATION  Date 07/13/23       Visit Diagnosis: S/P CABG x 3  Patient's Home Medications on Admission:  Current Outpatient Medications:    albuterol (PROVENTIL HFA;VENTOLIN HFA) 108 (90 BASE) MCG/ACT inhaler, Inhale 2 puffs into the lungs every 4 (four) hours as needed for wheezing or shortness of breath. , Disp: , Rfl:    alfuzosin (UROXATRAL) 10 MG 24 hr tablet, Take 1 tablet (10 mg total) by mouth at bedtime., Disp: 30 tablet, Rfl: 11   AMBULATORY NON FORMULARY MEDICATION, 0.2 mLs by Intracavernosal route as needed. Medication Name: Trimix  PGE Pap 30mg  Phent 1mg , Disp: 5 mL, Rfl: 5   atorvastatin (LIPITOR) 10 MG tablet, Take 10 mg by mouth daily., Disp: , Rfl:    carvedilol (COREG) 25 MG tablet, Take 25 mg by mouth 2 (two) times daily with a meal. Breakfast & supper, Disp: , Rfl:    cloNIDine (CATAPRES - DOSED IN MG/24 HR) 0.1 mg/24hr patch, Place 0.1 mg onto the skin every Sunday., Disp: , Rfl:    diclofenac Sodium (VOLTAREN) 1 % GEL, Apply 1 g topically 4 (four) times daily as needed (pain.)., Disp: , Rfl:    fluticasone (FLONASE) 50 MCG/ACT nasal spray, Place 2 sprays into both nostrils daily. , Disp: , Rfl:    Fluticasone-Umeclidin-Vilant (TRELEGY ELLIPTA) 100-62.5-25 MCG/INH AEPB, Inhale 1 Inhaler into the lungs daily., Disp: , Rfl:    hydrALAZINE (APRESOLINE) 50 MG tablet, Take 50 mg by mouth in the morning and at bedtime., Disp: , Rfl:    HYDROcodone-acetaminophen (NORCO) 5-325 MG tablet, Take 1 tablet by mouth every 4 (four) hours as needed for moderate pain., Disp: 40 tablet, Rfl:  0   isosorbide mononitrate (IMDUR) 60 MG 24 hr tablet, Take 60 mg by mouth in the morning and at bedtime. , Disp: , Rfl:    metFORMIN (GLUCOPHAGE) 500 MG tablet, Take 500 mg by mouth 2 (two) times daily., Disp: , Rfl:    nitroGLYCERIN (NITROSTAT) 0.4 MG SL tablet, Place 0.4 mg under the tongue every 5 (five) minutes x 3 doses as needed for chest pain., Disp: , Rfl:    olmesartan (BENICAR) 20 MG tablet, Take 20 mg by mouth daily., Disp: , Rfl:    ranolazine (RANEXA) 500 MG 12 hr tablet, Take 500 mg by mouth 2 (two) times daily., Disp: , Rfl:    vitamin C (ASCORBIC ACID) 250 MG tablet, Take 250 mg by mouth daily., Disp: , Rfl:   Past Medical History: Past Medical History:  Diagnosis Date   Asthma    Bronchitis    COPD (chronic obstructive pulmonary disease) (HCC)    Coronary artery disease    Diabetes mellitus without complication (HCC)    Headache    High cholesterol    Hypertension     Tobacco Use: Social History   Tobacco Use  Smoking Status Former   Current packs/day: 0.00   Types: Cigarettes   Quit date: 08/30/1997   Years since quitting: 25.8  Smokeless Tobacco Never    Labs: Review Flowsheet       Latest Ref Rng & Units 08/10/2018 01/03/2019  03/10/2019 01/14/2020  Labs for ITP Cardiac and Pulmonary Rehab  Cholestrol 0 - 200 mg/dL - - 644  -  LDL (calc) 0 - 99 mg/dL - - 034  -  HDL-C >74 mg/dL - - 35  -  Trlycerides <150 mg/dL - - 259  -  Hemoglobin A1c 4.8 - 5.6 % 6.2  5.5  6.3  6.3     Details            Capillary Blood Glucose: Lab Results  Component Value Date   GLUCAP 113 (H) 01/16/2020   GLUCAP 88 01/16/2020   GLUCAP 103 (H) 01/06/2019   GLUCAP 110 (H) 01/06/2019   GLUCAP 118 (H) 01/05/2019     Exercise Target Goals: Exercise Program Goal: Individual exercise prescription set using results from initial 6 min walk test and THRR while considering  patient's activity barriers and safety.   Exercise Prescription Goal: Starting with aerobic  activity 30 plus minutes a day, 3 days per week for initial exercise prescription. Provide home exercise prescription and guidelines that participant acknowledges understanding prior to discharge.  Activity Barriers & Risk Stratification:  Activity Barriers & Cardiac Risk Stratification - 07/08/23 0916       Activity Barriers & Cardiac Risk Stratification   Activity Barriers Shortness of Breath;Incisional Pain;Chest Pain/Angina;Assistive Device    Cardiac Risk Stratification High             6 Minute Walk:  6 Minute Walk     Row Name 07/13/23 0913         6 Minute Walk   Phase Initial     Distance 1330 feet     Walk Time 6 minutes     # of Rest Breaks 0     MPH 2.51     METS 3.32     RPE 11     VO2 Peak 11.64     Symptoms No     Resting HR 83 bpm     Resting BP 126/84     Resting Oxygen Saturation  99 %     Exercise Oxygen Saturation  during 6 min walk 98 %     Max Ex. HR 95 bpm     Max Ex. BP 136/86     2 Minute Post BP 124/80              Oxygen Initial Assessment:   Oxygen Re-Evaluation:   Oxygen Discharge (Final Oxygen Re-Evaluation):   Initial Exercise Prescription:  Initial Exercise Prescription - 07/13/23 0900       Date of Initial Exercise RX and Referring Provider   Date 07/13/23    Referring Provider Dr. Mayford Knife      Oxygen   Maintain Oxygen Saturation 88% or higher      Treadmill   MPH 2    Grade 0    Minutes 15      Elliptical   Level 2    Speed 50    Minutes 15      Prescription Details   Frequency (times per week) 3    Duration Progress to 30 minutes of continuous aerobic without signs/symptoms of physical distress      Intensity   Ratings of Perceived Exertion 11-13      Resistance Training   Training Prescription Yes    Weight 4    Reps 10-15             Perform Capillary Blood Glucose checks as needed.  Exercise Prescription Changes:  Exercise Comments:   Exercise Goals and Review:   Exercise  Goals     Row Name 07/13/23 0916             Exercise Goals   Increase Physical Activity Yes       Intervention Provide advice, education, support and counseling about physical activity/exercise needs.;Develop an individualized exercise prescription for aerobic and resistive training based on initial evaluation findings, risk stratification, comorbidities and participant's personal goals.       Expected Outcomes Short Term: Attend rehab on a regular basis to increase amount of physical activity.;Long Term: Add in home exercise to make exercise part of routine and to increase amount of physical activity.;Long Term: Exercising regularly at least 3-5 days a week.       Increase Strength and Stamina Yes       Intervention Provide advice, education, support and counseling about physical activity/exercise needs.;Develop an individualized exercise prescription for aerobic and resistive training based on initial evaluation findings, risk stratification, comorbidities and participant's personal goals.       Expected Outcomes Short Term: Increase workloads from initial exercise prescription for resistance, speed, and METs.;Short Term: Perform resistance training exercises routinely during rehab and add in resistance training at home;Long Term: Improve cardiorespiratory fitness, muscular endurance and strength as measured by increased METs and functional capacity ( )       Able to understand and use rate of perceived exertion (RPE) scale Yes       Intervention Provide education and explanation on how to use RPE scale       Expected Outcomes Short Term: Able to use RPE daily in rehab to express subjective intensity level;Long Term:  Able to use RPE to guide intensity level when exercising independently       Knowledge and understanding of Target Heart Rate Range (THRR) Yes       Intervention Provide education and explanation of THRR including how the numbers were predicted and where they are located for  reference       Expected Outcomes Short Term: Able to state/look up THRR;Long Term: Able to use THRR to govern intensity when exercising independently;Short Term: Able to use daily as guideline for intensity in rehab       Able to check pulse independently Yes       Intervention Provide education and demonstration on how to check pulse in carotid and radial arteries.;Review the importance of being able to check your own pulse for safety during independent exercise       Expected Outcomes Short Term: Able to explain why pulse checking is important during independent exercise;Long Term: Able to check pulse independently and accurately       Understanding of Exercise Prescription Yes       Intervention Provide education, explanation, and written materials on patient's individual exercise prescription       Expected Outcomes Short Term: Able to explain program exercise prescription;Long Term: Able to explain home exercise prescription to exercise independently                Exercise Goals Re-Evaluation :    Discharge Exercise Prescription (Final Exercise Prescription Changes):   Nutrition:  Target Goals: Understanding of nutrition guidelines, daily intake of sodium 1500mg , cholesterol 200mg , calories 30% from fat and 7% or less from saturated fats, daily to have 5 or more servings of fruits and vegetables.  Biometrics:  Pre Biometrics - 07/13/23 0917       Pre Biometrics   Height 5'  6" (1.676 m)    Weight 87 kg    Waist Circumference 42 inches    Hip Circumference 41 inches    Waist to Hip Ratio 1.02 %    BMI (Calculated) 30.97    Grip Strength 28.9 kg    Single Leg Stand 31.35 seconds              Nutrition Therapy Plan and Nutrition Goals:   Nutrition Assessments:  MEDIFICTS Score Key: >=70 Need to make dietary changes  40-70 Heart Healthy Diet <= 40 Therapeutic Level Cholesterol Diet   Picture Your Plate Scores: <16 Unhealthy dietary pattern with much room  for improvement. 41-50 Dietary pattern unlikely to meet recommendations for good health and room for improvement. 51-60 More healthful dietary pattern, with some room for improvement.  >60 Healthy dietary pattern, although there may be some specific behaviors that could be improved.    Nutrition Goals Re-Evaluation:   Nutrition Goals Discharge (Final Nutrition Goals Re-Evaluation):   Psychosocial: Target Goals: Acknowledge presence or absence of significant depression and/or stress, maximize coping skills, provide positive support system. Participant is able to verbalize types and ability to use techniques and skills needed for reducing stress and depression.  Initial Review & Psychosocial Screening:  Initial Psych Review & Screening - 07/08/23 0942       Initial Review   Current issues with Current Sleep Concerns      Family Dynamics   Good Support System? Yes      Barriers   Psychosocial barriers to participate in program The patient should benefit from training in stress management and relaxation.      Screening Interventions   Interventions Encouraged to exercise;To provide support and resources with identified psychosocial needs;Provide feedback about the scores to participant    Expected Outcomes Short Term goal: Utilizing psychosocial counselor, staff and physician to assist with identification of specific Stressors or current issues interfering with healing process. Setting desired goal for each stressor or current issue identified.;Long Term Goal: Stressors or current issues are controlled or eliminated.;Short Term goal: Identification and review with participant of any Quality of Life or Depression concerns found by scoring the questionnaire.;Long Term goal: The participant improves quality of Life and PHQ9 Scores as seen by post scores and/or verbalization of changes             Quality of Life Scores:  Quality of Life - 07/13/23 1057       Quality of Life    Select Quality of Life      Quality of Life Scores   Health/Function Pre 28.43 %    Socioeconomic Pre 30 %    Psych/Spiritual Pre 30 %    Family Pre 24 %    GLOBAL Pre 28.38 %            Scores of 19 and below usually indicate a poorer quality of life in these areas.  A difference of  2-3 points is a clinically meaningful difference.  A difference of 2-3 points in the total score of the Quality of Life Index has been associated with significant improvement in overall quality of life, self-image, physical symptoms, and general health in studies assessing change in quality of life.  PHQ-9: Review Flowsheet       07/13/2023 10/02/2018  Depression screen PHQ 2/9  Decreased Interest 0 0  Down, Depressed, Hopeless 0 0  PHQ - 2 Score 0 0  Altered sleeping 0 -  Tired, decreased energy 0 -  Change in appetite 0 -  Feeling bad or failure about yourself  0 -  Trouble concentrating 0 -  Moving slowly or fidgety/restless 0 -  Suicidal thoughts 0 -  PHQ-9 Score 0 -    Details           Interpretation of Total Score  Total Score Depression Severity:  1-4 = Minimal depression, 5-9 = Mild depression, 10-14 = Moderate depression, 15-19 = Moderately severe depression, 20-27 = Severe depression   Psychosocial Evaluation and Intervention:  Psychosocial Evaluation - 07/08/23 0942       Psychosocial Evaluation & Interventions   Interventions Stress management education;Relaxation education;Encouraged to exercise with the program and follow exercise prescription    Comments Patient was referred to CR with CABGx3 from [facility]. He denies any depression or anxiety or stressors in his life. He does report trouble staying alseep due to frequent urination which has been a chronic problem for him for several years. He has tried sleepaides in the past but says he does not use anything currently. He lives alone. He has a son that lives in [place] whom he says plans to move near him soon. He is hopeful  this will happen. He says his sister is his main support person. She lives in the same apartment complex and checks on him everyday. She also helps him with his medications. He did not know what medications he was taking. He said he would bring a list with him to his orientation visit. He completed the 8th grade and says he is able to read some but not a lot. He said his sister would help him complete the paperwork for the program but he has not recieved the paperwork yet. His goals for the program are to get better overall; improve his strength, stamina and energy and get back to normal. He has no barriers identified to complete CR.    Expected Outcomes Short Term: start the program and attend consistently. Long Term: meet his personal goals.    Continue Psychosocial Services  Follow up required by staff             Psychosocial Re-Evaluation:   Psychosocial Discharge (Final Psychosocial Re-Evaluation):   Vocational Rehabilitation: Provide vocational rehab assistance to qualifying candidates.   Vocational Rehab Evaluation & Intervention:  Vocational Rehab - 07/08/23 0936       Initial Vocational Rehab Evaluation & Intervention   Assessment shows need for Vocational Rehabilitation No      Vocational Rehab Re-Evaulation   Comments Patient is disabled and does not need vocational rehab.             Education: Education Goals: Education classes will be provided on a weekly basis, covering required topics. Participant will state understanding/return demonstration of topics presented.  Learning Barriers/Preferences:  Learning Barriers/Preferences - 07/08/23 0936       Learning Barriers/Preferences   Learning Barriers None    Learning Preferences Skilled Demonstration;Audio             Education Topics: Hypertension, Hypertension Reduction -Define heart disease and high blood pressure. Discus how high blood pressure affects the body and ways to reduce high blood  pressure.   Exercise and Your Heart -Discuss why it is important to exercise, the FITT principles of exercise, normal and abnormal responses to exercise, and how to exercise safely.   Angina -Discuss definition of angina, causes of angina, treatment of angina, and how to decrease risk of having angina.   Cardiac Medications -Review  what the following cardiac medications are used for, how they affect the body, and side effects that may occur when taking the medications.  Medications include Aspirin, Beta blockers, calcium channel blockers, ACE Inhibitors, angiotensin receptor blockers, diuretics, digoxin, and antihyperlipidemics.   Congestive Heart Failure -Discuss the definition of CHF, how to live with CHF, the signs and symptoms of CHF, and how keep track of weight and sodium intake.   Heart Disease and Intimacy -Discus the effect sexual activity has on the heart, how changes occur during intimacy as we age, and safety during sexual activity.   Smoking Cessation / COPD -Discuss different methods to quit smoking, the health benefits of quitting smoking, and the definition of COPD.   Nutrition I: Fats -Discuss the types of cholesterol, what cholesterol does to the heart, and how cholesterol levels can be controlled.   Nutrition II: Labels -Discuss the different components of food labels and how to read food label   Heart Parts/Heart Disease and PAD -Discuss the anatomy of the heart, the pathway of blood circulation through the heart, and these are affected by heart disease.   Stress I: Signs and Symptoms -Discuss the causes of stress, how stress may lead to anxiety and depression, and ways to limit stress.   Stress II: Relaxation -Discuss different types of relaxation techniques to limit stress.   Warning Signs of Stroke / TIA -Discuss definition of a stroke, what the signs and symptoms are of a stroke, and how to identify when someone is having stroke.   Knowledge  Questionnaire Score:  Knowledge Questionnaire Score - 07/13/23 0855       Knowledge Questionnaire Score   Pre Score 15/24             Core Components/Risk Factors/Patient Goals at Admission:  Personal Goals and Risk Factors at Admission - 07/08/23 0938       Core Components/Risk Factors/Patient Goals on Admission    Weight Management Weight Maintenance    Improve shortness of breath with ADL's Yes    Intervention Provide education, individualized exercise plan and daily activity instruction to help decrease symptoms of SOB with activities of daily living.    Expected Outcomes Short Term: Improve cardiorespiratory fitness to achieve a reduction of symptoms when performing ADLs;Long Term: Be able to perform more ADLs without symptoms or delay the onset of symptoms    Diabetes Yes    Intervention Provide education about signs/symptoms and action to take for hypo/hyperglycemia.;Provide education about proper nutrition, including hydration, and aerobic/resistive exercise prescription along with prescribed medications to achieve blood glucose in normal ranges: Fasting glucose 65-99 mg/dL    Expected Outcomes Short Term: Participant verbalizes understanding of the signs/symptoms and immediate care of hyper/hypoglycemia, proper foot care and importance of medication, aerobic/resistive exercise and nutrition plan for blood glucose control.;Long Term: Attainment of HbA1C < 7%.    Hypertension Yes    Intervention Monitor prescription use compliance.;Provide education on lifestyle modifcations including regular physical activity/exercise, weight management, moderate sodium restriction and increased consumption of fresh fruit, vegetables, and low fat dairy, alcohol moderation, and smoking cessation.    Expected Outcomes Long Term: Maintenance of blood pressure at goal levels.;Short Term: Continued assessment and intervention until BP is < 140/52mm HG in hypertensive participants. < 130/51mm HG in  hypertensive participants with diabetes, heart failure or chronic kidney disease.    Lipids Yes    Intervention Provide education and support for participant on nutrition & aerobic/resistive exercise along with prescribed medications to achieve LDL <  70mg , HDL >40mg .    Expected Outcomes Short Term: Participant states understanding of desired cholesterol values and is compliant with medications prescribed. Participant is following exercise prescription and nutrition guidelines.;Long Term: Cholesterol controlled with medications as prescribed, with individualized exercise RX and with personalized nutrition plan. Value goals: LDL < 70mg , HDL > 40 mg.             Core Components/Risk Factors/Patient Goals Review:    Core Components/Risk Factors/Patient Goals at Discharge (Final Review):    ITP Comments:   Comments: Patient arrived for 1st visit/orientation/education at 0800. 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 During orientation advised patient on arrival and appointment times what to wear, what to do before, during and after exercise. Reviewed attendance and class policy.  Pt is scheduled to return Cardiac Rehab on Friday 07/15/23 at 0800. Pt was advised to come to class 15 minutes before class starts.  Discussed RPE/Dpysnea scales. Patient participated in warm up stretches. Patient was able to complete 6 minute walk test.  Telemetry:NSR. Patient was measured for the equipment. Discussed equipment safety with patient. Took patient pre-anthropometric measurements. Patient finished visit at 0900.

## 2023-07-15 ENCOUNTER — Encounter (HOSPITAL_COMMUNITY)
Admission: RE | Admit: 2023-07-15 | Discharge: 2023-07-15 | Disposition: A | Discharge status: Home / Self Care | Payer: Medicare Other | Source: Ambulatory Visit | Attending: Orthopedic Surgery | Admitting: Orthopedic Surgery

## 2023-07-15 DIAGNOSIS — Z951 Presence of aortocoronary bypass graft: Secondary | ICD-10-CM

## 2023-07-15 LAB — GLUCOSE, CAPILLARY
Glucose-Capillary: 225 mg/dL — ABNORMAL HIGH (ref 70–99)
Glucose-Capillary: 145 mg/dL — ABNORMAL HIGH (ref 70–99)

## 2023-07-15 NOTE — Progress Notes (Signed)
Daily Session Note  Patient Details  Name: Cody Shepard MRN: [id number] Date of Birth: [birth year] Referring Provider:   Flowsheet Row CARDIAC REHAB PHASE II ORIENTATION from 07/13/2023 in [facility] CARDIAC REHABILITATION  Referring Provider Dr. Mayford Knife       Encounter Date: 07/15/2023  Check In:  Session Check In - 07/15/23 0750       Check-In   Supervising physician immediately available to respond to emergencies See telemetry face sheet for immediately available MD    Location [facility]    Staff Present Ross Ludwig, BS, Exercise Physiologist;Jessica Juanetta Gosling, MA, RCEP, CCRP, Harolyn Rutherford, RN, BSN    Virtual Visit No    Medication changes reported     No    Fall or balance concerns reported    No    Tobacco Cessation No Change    Warm-up and Cool-down Performed on first and last piece of equipment    Resistance Training Performed Yes    VAD Patient? No    PAD/SET Patient? No      Pain Assessment   Currently in Pain? No/denies             Capillary Blood Glucose: No results found for this or any previous visit (from the past 24 hour(s)).    Social History   Tobacco Use  Smoking Status Former   Current packs/day: 0.00   Types: Cigarettes   Quit date: 08/30/1997   Years since quitting: 25.8  Smokeless Tobacco Never    Goals Met:  Independence with exercise equipment Exercise tolerated well No report of concerns or symptoms today Strength training completed today  Goals Unmet:  Not Applicable  Comments: First full day of exercise!  Patient was oriented to gym and equipment including functions, settings, policies, and procedures.  Patient's individual exercise prescription and treatment plan were reviewed.  All starting workloads were established based on the results of the 6 minute walk test done at initial orientation visit.  The plan for exercise progression was also introduced and progression will be customized based on  patient's performance and goals.    Dr. Dina Rich is Medical Director for [facility] Cardiac Rehab

## 2023-07-18 ENCOUNTER — Encounter (HOSPITAL_COMMUNITY): Payer: Medicare Other

## 2023-07-18 ENCOUNTER — Encounter (HOSPITAL_COMMUNITY)
Admission: RE | Admit: 2023-07-18 | Discharge: 2023-07-18 | Disposition: A | Discharge status: Home / Self Care | Payer: Medicare Other | Source: Ambulatory Visit | Attending: Orthopedic Surgery | Admitting: Orthopedic Surgery

## 2023-07-18 DIAGNOSIS — Z951 Presence of aortocoronary bypass graft: Secondary | ICD-10-CM | POA: Diagnosis not present

## 2023-07-18 LAB — GLUCOSE, CAPILLARY: Glucose-Capillary: 230 mg/dL — ABNORMAL HIGH (ref 70–99)

## 2023-07-18 NOTE — Progress Notes (Signed)
Daily Session Note  Patient Details  Name: Cody Shepard MRN: [id number] Date of Birth: 16-May-1966 Referring Provider:   Flowsheet Row CARDIAC REHAB PHASE II ORIENTATION from 07/13/2023 in [facility] CARDIAC REHABILITATION  Referring Provider Dr. Mayford Knife       Encounter Date: 07/18/2023  Check In:  Session Check In - 07/18/23 0915       Check-In   Supervising physician immediately available to respond to emergencies See telemetry face sheet for immediately available MD    Location [facility]    Staff Present Ross Ludwig, BS, Exercise Physiologist;Debra Laural Benes, RN, BSN;Forest Pruden, RN;Daphyne Daphine Deutscher, RN, BSN;Jessica Hawkins, MA, RCEP, CCRP, CCET    Virtual Visit No    Medication changes reported     No    Fall or balance concerns reported    No    Tobacco Cessation No Change    Warm-up and Cool-down Performed on first and last piece of equipment    Resistance Training Performed Yes    VAD Patient? No    PAD/SET Patient? No      Pain Assessment   Currently in Pain? No/denies    Multiple Pain Sites No             Capillary Blood Glucose: Results for orders placed or performed during the hospital encounter of 07/15/23 (from the past 24 hour(s))  Glucose, capillary     Status: Abnormal   Collection Time: 07/18/23  9:13 AM  Result Value Ref Range   Glucose-Capillary 230 (H) 70 - 99 mg/dL      Social History   Tobacco Use  Smoking Status Former   Current packs/day: 0.00   Types: Cigarettes   Quit date: 08/30/1997   Years since quitting: 25.8  Smokeless Tobacco Never    Goals Met:  Independence with exercise equipment Exercise tolerated well No report of concerns or symptoms today Strength training completed today  Goals Unmet:  Not Applicable  Comments: Pt able to follow exercise prescription today without complaint.  Will continue to monitor for progression.    Dr. Dina Rich is Medical Director for [facility] Cardiac  Rehab

## 2023-07-20 ENCOUNTER — Encounter (HOSPITAL_COMMUNITY)
Admission: RE | Admit: 2023-07-20 | Discharge: 2023-07-20 | Disposition: A | Discharge status: Home / Self Care | Payer: Medicare Other | Source: Ambulatory Visit | Attending: Orthopedic Surgery | Admitting: Orthopedic Surgery

## 2023-07-20 DIAGNOSIS — Z951 Presence of aortocoronary bypass graft: Secondary | ICD-10-CM | POA: Diagnosis not present

## 2023-07-20 LAB — GLUCOSE, CAPILLARY
Glucose-Capillary: 128 mg/dL — ABNORMAL HIGH (ref 70–99)
Glucose-Capillary: 92 mg/dL (ref 70–99)

## 2023-07-20 NOTE — Progress Notes (Signed)
Daily Session Note  Patient Details  Name: Cody Shepard MRN: [id number] Date of Birth: Nov 24, 1965 Referring Provider:   Flowsheet Row CARDIAC REHAB PHASE II ORIENTATION from 07/13/2023 in [facility] CARDIAC REHABILITATION  Referring Provider Dr. Mayford Knife       Encounter Date: 07/20/2023  Check In:  Session Check In - 07/20/23 0811       Check-In   Supervising physician immediately available to respond to emergencies See telemetry face sheet for immediately available MD    Location [facility]    Staff Present Ross Ludwig, BS, Exercise Physiologist;Jacquese Hackman Juanetta Gosling, MA, RCEP, CCRP, CCET;Hillary Troutman BSN, RN    Virtual Visit No    Medication changes reported     No    Fall or balance concerns reported    No    Warm-up and Cool-down Performed on first and last piece of equipment    Resistance Training Performed Yes    VAD Patient? No    PAD/SET Patient? No      Pain Assessment   Currently in Pain? No/denies             Capillary Blood Glucose: No results found for this or any previous visit (from the past 24 hour(s)).    Social History   Tobacco Use  Smoking Status Former   Current packs/day: 0.00   Types: Cigarettes   Quit date: 08/30/1997   Years since quitting: 25.9  Smokeless Tobacco Never    Goals Met:  Independence with exercise equipment Exercise tolerated well No report of concerns or symptoms today Strength training completed today  Goals Unmet:  Not Applicable  Comments: Pt able to follow exercise prescription today without complaint.  Will continue to monitor for progression.    Dr. Dina Rich is Medical Director for [facility] Cardiac Rehab

## 2023-07-22 ENCOUNTER — Encounter (HOSPITAL_COMMUNITY)
Admission: RE | Admit: 2023-07-22 | Discharge: 2023-07-22 | Disposition: A | Discharge status: Home / Self Care | Payer: Medicare Other | Source: Ambulatory Visit | Attending: Orthopedic Surgery | Admitting: Orthopedic Surgery

## 2023-07-22 DIAGNOSIS — Z951 Presence of aortocoronary bypass graft: Secondary | ICD-10-CM | POA: Diagnosis not present

## 2023-07-22 NOTE — Progress Notes (Signed)
Daily Session Note  Patient Details  Name: Cody Shepard MRN: [id number] Date of Birth: 1966-03-31 Referring Provider:   Flowsheet Row CARDIAC REHAB PHASE II ORIENTATION from 07/13/2023 in [facility] CARDIAC REHABILITATION  Referring Provider Dr. Mayford Knife       Encounter Date: 07/22/2023  Check In:  Session Check In - 07/22/23 0758       Check-In   Supervising physician immediately available to respond to emergencies See telemetry face sheet for immediately available MD    Location [facility]    Staff Present Ross Ludwig, BS, Exercise Physiologist;Jahzion Brogden Juanetta Gosling, MA, RCEP, CCRP, Dow Adolph, RN, BSN    Virtual Visit No    Medication changes reported     No    Fall or balance concerns reported    No    Warm-up and Cool-down Performed on first and last piece of equipment    Resistance Training Performed Yes    VAD Patient? No    PAD/SET Patient? No      Pain Assessment   Currently in Pain? No/denies             Capillary Blood Glucose: No results found for this or any previous visit (from the past 24 hour(s)).    Social History   Tobacco Use  Smoking Status Former   Current packs/day: 0.00   Types: Cigarettes   Quit date: 08/30/1997   Years since quitting: 25.9  Smokeless Tobacco Never    Goals Met:  Independence with exercise equipment Exercise tolerated well No report of concerns or symptoms today Strength training completed today  Goals Unmet:  Not Applicable  Comments: Pt able to follow exercise prescription today without complaint.  Will continue to monitor for progression.    Dr. Dina Rich is Medical Director for [facility] Cardiac Rehab

## 2023-07-25 ENCOUNTER — Encounter (HOSPITAL_COMMUNITY): Payer: Medicare Other

## 2023-07-27 ENCOUNTER — Encounter (HOSPITAL_COMMUNITY)
Admission: RE | Admit: 2023-07-27 | Discharge: 2023-07-27 | Disposition: A | Discharge status: Home / Self Care | Payer: Medicare Other | Source: Ambulatory Visit | Attending: Orthopedic Surgery | Admitting: Orthopedic Surgery

## 2023-07-27 DIAGNOSIS — Z951 Presence of aortocoronary bypass graft: Secondary | ICD-10-CM | POA: Diagnosis present

## 2023-07-27 NOTE — Progress Notes (Signed)
Daily Session Note  Patient Details  Name: Cody Shepard MRN: [id number] Date of Birth: November 16, 1965 Referring Provider:   Flowsheet Row CARDIAC REHAB PHASE II ORIENTATION from 07/13/2023 in [facility] CARDIAC REHABILITATION  Referring Provider Dr. Mayford Knife       Encounter Date: 07/27/2023  Check In:  Session Check In - 07/27/23 0745       Check-In   Supervising physician immediately available to respond to emergencies See telemetry face sheet for immediately available MD    Location [facility]    Staff Present Ross Ludwig, BS, Exercise Physiologist;Debra Laural Benes, RN, BSN;Hillary Troutman BSN, RN    Virtual Visit No    Medication changes reported     No    Fall or balance concerns reported    No    Tobacco Cessation No Change    Warm-up and Cool-down Performed on first and last piece of equipment    Resistance Training Performed Yes    VAD Patient? No    PAD/SET Patient? No      Pain Assessment   Currently in Pain? No/denies    Multiple Pain Sites No             Capillary Blood Glucose: No results found for this or any previous visit (from the past 24 hour(s)).    Social History   Tobacco Use  Smoking Status Former   Current packs/day: 0.00   Types: Cigarettes   Quit date: 08/30/1997   Years since quitting: 25.9  Smokeless Tobacco Never    Goals Met:  Independence with exercise equipment Exercise tolerated well No report of concerns or symptoms today Strength training completed today  Goals Unmet:  Not Applicable  Comments: Pt able to follow exercise prescription today without complaint.  Will continue to monitor for progression.    Dr. Dina Rich is Medical Director for [facility] Cardiac Rehab

## 2023-07-29 ENCOUNTER — Encounter (HOSPITAL_COMMUNITY): Payer: Medicare Other

## 2023-08-01 ENCOUNTER — Encounter (HOSPITAL_COMMUNITY)
Admission: RE | Admit: 2023-08-01 | Discharge: 2023-08-01 | Disposition: A | Discharge status: Home / Self Care | Payer: Medicare Other | Source: Ambulatory Visit | Attending: Orthopedic Surgery | Admitting: Orthopedic Surgery

## 2023-08-01 DIAGNOSIS — Z951 Presence of aortocoronary bypass graft: Secondary | ICD-10-CM | POA: Diagnosis not present

## 2023-08-01 NOTE — Progress Notes (Signed)
Daily Session Note  Patient Details  Name: Cody Shepard MRN: [id number] Date of Birth: 10/06/1966 Referring Provider:   Flowsheet Row CARDIAC REHAB PHASE II ORIENTATION from 07/13/2023 in [facility] CARDIAC REHABILITATION  Referring Provider Dr. Mayford Knife       Encounter Date: 08/01/2023  Check In:  Session Check In - 08/01/23 0745       Check-In   Supervising physician immediately available to respond to emergencies See telemetry face sheet for immediately available MD    Location [facility]    Staff Present Fabio Pierce, MA, RCEP, CCRP, Harolyn Rutherford, RN, BSN    Virtual Visit No    Medication changes reported     No    Fall or balance concerns reported    No    Tobacco Cessation No Change    Warm-up and Cool-down Performed on first and last piece of equipment    Resistance Training Performed Yes    VAD Patient? No      Pain Assessment   Currently in Pain? No/denies             Capillary Blood Glucose: No results found for this or any previous visit (from the past 24 hour(s)).    Social History   Tobacco Use  Smoking Status Former   Current packs/day: 0.00   Types: Cigarettes   Quit date: 08/30/1997   Years since quitting: 25.9  Smokeless Tobacco Never    Goals Met:  Independence with exercise equipment Exercise tolerated well No report of concerns or symptoms today Strength training completed today  Goals Unmet:  Not Applicable  Comments: Pt able to follow exercise prescription today without complaint.  Will continue to monitor for progression.    Dr. Dina Rich is Medical Director for [facility] Cardiac Rehab

## 2023-08-03 ENCOUNTER — Encounter (HOSPITAL_COMMUNITY): Payer: Medicare Other

## 2023-08-03 ENCOUNTER — Encounter (HOSPITAL_COMMUNITY): Payer: Self-pay | Admitting: *Deleted

## 2023-08-03 DIAGNOSIS — Z951 Presence of aortocoronary bypass graft: Secondary | ICD-10-CM

## 2023-08-03 NOTE — Progress Notes (Signed)
Cardiac Individual Treatment Plan  Patient Details  Name: Cody Shepard MRN: [id number] Date of Birth: [birth year] Referring Provider:   Flowsheet Row CARDIAC REHAB PHASE II ORIENTATION from 07/13/2023 in [facility] CARDIAC REHABILITATION  Referring Provider Dr. Mayford Knife       Initial Encounter Date:  Flowsheet Row CARDIAC REHAB PHASE II ORIENTATION from 07/13/2023 in [facility] [place] CARDIAC REHABILITATION  Date 07/13/23       Visit Diagnosis: S/P CABG x 3  Patient's Home Medications on Admission:  Current Outpatient Medications:    albuterol (PROVENTIL HFA;VENTOLIN HFA) 108 (90 BASE) MCG/ACT inhaler, Inhale 2 puffs into the lungs every 4 (four) hours as needed for wheezing or shortness of breath.  (Patient not taking: Reported on 07/15/2023), Disp: , Rfl:    alfuzosin (UROXATRAL) 10 MG 24 hr tablet, Take 1 tablet (10 mg total) by mouth at bedtime. (Patient not taking: Reported on 07/15/2023), Disp: 30 tablet, Rfl: 11   AMBULATORY NON FORMULARY MEDICATION, 0.2 mLs by Intracavernosal route as needed. Medication Name: Trimix  PGE Pap 30mg  Phent 1mg  (Patient not taking: Reported on 07/15/2023), Disp: 5 mL, Rfl: 5   amLODipine (NORVASC) 2.5 MG tablet, Take 2.5 mg by mouth daily., Disp: , Rfl:    atorvastatin (LIPITOR) 80 MG tablet, Take 80 mg by mouth daily., Disp: , Rfl:    carvedilol (COREG) 25 MG tablet, Take 25 mg by mouth 2 (two) times daily with a meal. Breakfast & supper (Patient not taking: Reported on 07/15/2023), Disp: , Rfl:    cloNIDine (CATAPRES - DOSED IN MG/24 HR) 0.1 mg/24hr patch, Place 0.1 mg onto the skin every Sunday. (Patient not taking: Reported on 07/15/2023), Disp: , Rfl:    diclofenac Sodium (VOLTAREN) 1 % GEL, Apply 1 g topically 4 (four) times daily as needed (pain.). (Patient not taking: Reported on 07/15/2023), Disp: , Rfl:    ELIQUIS 5 MG TABS tablet, Take 5 mg by mouth every 12 (twelve) hours., Disp: , Rfl:    fluticasone (FLONASE) 50 MCG/ACT nasal spray, Place 2  sprays into both nostrils daily.  (Patient not taking: Reported on 07/15/2023), Disp: , Rfl:    Fluticasone-Umeclidin-Vilant (TRELEGY ELLIPTA) 100-62.5-25 MCG/INH AEPB, Inhale 1 Inhaler into the lungs daily. (Patient not taking: Reported on 07/15/2023), Disp: , Rfl:    hydrALAZINE (APRESOLINE) 50 MG tablet, Take 50 mg by mouth in the morning and at bedtime. (Patient not taking: Reported on 07/15/2023), Disp: , Rfl:    HYDROcodone-acetaminophen (NORCO) 5-325 MG tablet, Take 1 tablet by mouth every 4 (four) hours as needed for moderate pain., Disp: 40 tablet, Rfl: 0   isosorbide mononitrate (IMDUR) 60 MG 24 hr tablet, Take 60 mg by mouth in the morning and at bedtime.  (Patient not taking: Reported on 07/15/2023), Disp: , Rfl:    metFORMIN (GLUCOPHAGE) 500 MG tablet, Take 500 mg by mouth 2 (two) times daily. (Patient not taking: Reported on 07/15/2023), Disp: , Rfl:    metoprolol tartrate (LOPRESSOR) 25 MG tablet, Take 25 mg by mouth 2 (two) times daily., Disp: , Rfl:    nitroGLYCERIN (NITROSTAT) 0.4 MG SL tablet, Place 0.4 mg under the tongue every 5 (five) minutes x 3 doses as needed for chest pain., Disp: , Rfl:    olmesartan (BENICAR) 20 MG tablet, Take 20 mg by mouth daily. (Patient not taking: Reported on 07/15/2023), Disp: , Rfl:    ranolazine (RANEXA) 500 MG 12 hr tablet, Take 500 mg by mouth 2 (two) times daily. (Patient not taking: Reported on  07/15/2023), Disp: , Rfl:    tamsulosin (FLOMAX) 0.4 MG CAPS capsule, Take 0.4 mg by mouth daily. (Patient not taking: Reported on 07/15/2023), Disp: , Rfl:    vitamin C (ASCORBIC ACID) 250 MG tablet, Take 250 mg by mouth daily. (Patient not taking: Reported on 07/15/2023), Disp: , Rfl:   Past Medical History: Past Medical History:  Diagnosis Date   Asthma    Bronchitis    COPD (chronic obstructive pulmonary disease) (HCC)    Coronary artery disease    Diabetes mellitus without complication (HCC)    Headache    High cholesterol    Hypertension      Tobacco Use: Social History   Tobacco Use  Smoking Status Former   Current packs/day: 0.00   Types: Cigarettes   Quit date: 08/30/1997   Years since quitting: 25.9  Smokeless Tobacco Never    Labs: Review Flowsheet       Latest Ref Rng & Units 08/10/2018 01/03/2019 03/10/2019 01/14/2020  Labs for ITP Cardiac and Pulmonary Rehab  Cholestrol 0 - 200 mg/dL - - 409  -  LDL (calc) 0 - 99 mg/dL - - 811  -  HDL-C >91 mg/dL - - 35  -  Trlycerides <150 mg/dL - - 478  -  Hemoglobin A1c 4.8 - 5.6 % 6.2  5.5  6.3  6.3     Details            Capillary Blood Glucose: Lab Results  Component Value Date   GLUCAP 92 07/20/2023   GLUCAP 128 (H) 07/20/2023   GLUCAP 230 (H) 07/18/2023   GLUCAP 145 (H) 07/15/2023   GLUCAP 225 (H) 07/15/2023     Exercise Target Goals: Exercise Program Goal: Individual exercise prescription set using results from initial 6 min walk test and THRR while considering  patient's activity barriers and safety.   Exercise Prescription Goal: Starting with aerobic activity 30 plus minutes a day, 3 days per week for initial exercise prescription. Provide home exercise prescription and guidelines that participant acknowledges understanding prior to discharge.  Activity Barriers & Risk Stratification:  Activity Barriers & Cardiac Risk Stratification - 07/08/23 0916       Activity Barriers & Cardiac Risk Stratification   Activity Barriers Shortness of Breath;Incisional Pain;Chest Pain/Angina;Assistive Device    Cardiac Risk Stratification High             6 Minute Walk:  6 Minute Walk     Row Name 07/13/23 0913         6 Minute Walk   Phase Initial     Distance 1330 feet     Walk Time 6 minutes     # of Rest Breaks 0     MPH 2.51     METS 3.32     RPE 11     VO2 Peak 11.64     Symptoms No     Resting HR 83 bpm     Resting BP 126/84     Resting Oxygen Saturation  99 %     Exercise Oxygen Saturation  during 6 min walk 98 %     Max Ex. HR  95 bpm     Max Ex. BP 136/86     2 Minute Post BP 124/80              Oxygen Initial Assessment:   Oxygen Re-Evaluation:   Oxygen Discharge (Final Oxygen Re-Evaluation):   Initial Exercise Prescription:  Initial Exercise Prescription - 07/13/23 0900  Date of Initial Exercise RX and Referring Provider   Date 07/13/23    Referring Provider Dr. Mayford Knife      Oxygen   Maintain Oxygen Saturation 88% or higher      Treadmill   MPH 2    Grade 0    Minutes 15      Elliptical   Level 2    Speed 50    Minutes 15      Prescription Details   Frequency (times per week) 3    Duration Progress to 30 minutes of continuous aerobic without signs/symptoms of physical distress      Intensity   THRR 40-80% of Max Heartrate 114-145    Ratings of Perceived Exertion 11-13    Perceived Dyspnea 0-4      Resistance Training   Training Prescription Yes    Weight 4    Reps 10-15             Perform Capillary Blood Glucose checks as needed.  Exercise Prescription Changes:   Exercise Prescription Changes     Row Name 07/18/23 1300             Response to Exercise   Blood Pressure (Admit) 120/64       Blood Pressure (Exercise) 126/74       Blood Pressure (Exit) 110/60       Heart Rate (Admit) 101 bpm       Heart Rate (Exercise) 130 bpm       Heart Rate (Exit) 107 bpm       Rating of Perceived Exertion (Exercise) 10       Duration Continue with 30 min of aerobic exercise without signs/symptoms of physical distress.       Intensity THRR unchanged         Progression   Progression Continue to progress workloads to maintain intensity without signs/symptoms of physical distress.         Resistance Training   Training Prescription Yes       Weight 5       Reps 10-15       Time 1 Minutes         Treadmill   MPH 1.8       Grade 0       Minutes 15       METs 2.38         NuStep   Level 2       SPM 101       Minutes 15       METs 2.8          Elliptical   Level 2       Speed 49       Minutes 15       METs 2.5         Oxygen   Maintain Oxygen Saturation 88% or higher                Exercise Comments:   Exercise Goals and Review:   Exercise Goals     Row Name 07/13/23 0916             Exercise Goals   Increase Physical Activity Yes       Intervention Provide advice, education, support and counseling about physical activity/exercise needs.;Develop an individualized exercise prescription for aerobic and resistive training based on initial evaluation findings, risk stratification, comorbidities and participant's personal goals.       Expected Outcomes Short Term: Attend  rehab on a regular basis to increase amount of physical activity.;Long Term: Add in home exercise to make exercise part of routine and to increase amount of physical activity.;Long Term: Exercising regularly at least 3-5 days a week.       Increase Strength and Stamina Yes       Intervention Provide advice, education, support and counseling about physical activity/exercise needs.;Develop an individualized exercise prescription for aerobic and resistive training based on initial evaluation findings, risk stratification, comorbidities and participant's personal goals.       Expected Outcomes Short Term: Increase workloads from initial exercise prescription for resistance, speed, and METs.;Short Term: Perform resistance training exercises routinely during rehab and add in resistance training at home;Long Term: Improve cardiorespiratory fitness, muscular endurance and strength as measured by increased METs and functional capacity ( )       Able to understand and use rate of perceived exertion (RPE) scale Yes       Intervention Provide education and explanation on how to use RPE scale       Expected Outcomes Short Term: Able to use RPE daily in rehab to express subjective intensity level;Long Term:  Able to use RPE to guide intensity level when exercising  independently       Knowledge and understanding of Target Heart Rate Range (THRR) Yes       Intervention Provide education and explanation of THRR including how the numbers were predicted and where they are located for reference       Expected Outcomes Short Term: Able to state/look up THRR;Long Term: Able to use THRR to govern intensity when exercising independently;Short Term: Able to use daily as guideline for intensity in rehab       Able to check pulse independently Yes       Intervention Provide education and demonstration on how to check pulse in carotid and radial arteries.;Review the importance of being able to check your own pulse for safety during independent exercise       Expected Outcomes Short Term: Able to explain why pulse checking is important during independent exercise;Long Term: Able to check pulse independently and accurately       Understanding of Exercise Prescription Yes       Intervention Provide education, explanation, and written materials on patient's individual exercise prescription       Expected Outcomes Short Term: Able to explain program exercise prescription;Long Term: Able to explain home exercise prescription to exercise independently                Exercise Goals Re-Evaluation :  Exercise Goals Re-Evaluation     Row Name 07/18/23 1313             Exercise Goal Re-Evaluation   Exercise Goals Review Increase Physical Activity;Able to understand and use Dyspnea scale;Understanding of Exercise Prescription       Comments Terrall has just started rehab and has been to two sessions. He has been doing well, walking at 1.8 speed on the treadmill and level 2 on the REX-XR. Will continue to monitor and progress as able.       Expected Outcomes Short term: increase walking speed to 2.0   long term:continue to attend rehab                 Discharge Exercise Prescription (Final Exercise Prescription Changes):  Exercise Prescription Changes - 07/18/23 1300        Response to Exercise   Blood Pressure (Admit) 120/64  Blood Pressure (Exercise) 126/74    Blood Pressure (Exit) 110/60    Heart Rate (Admit) 101 bpm    Heart Rate (Exercise) 130 bpm    Heart Rate (Exit) 107 bpm    Rating of Perceived Exertion (Exercise) 10    Duration Continue with 30 min of aerobic exercise without signs/symptoms of physical distress.    Intensity THRR unchanged      Progression   Progression Continue to progress workloads to maintain intensity without signs/symptoms of physical distress.      Resistance Training   Training Prescription Yes    Weight 5    Reps 10-15    Time 1 Minutes      Treadmill   MPH 1.8    Grade 0    Minutes 15    METs 2.38      NuStep   Level 2    SPM 101    Minutes 15    METs 2.8      Elliptical   Level 2    Speed 49    Minutes 15    METs 2.5      Oxygen   Maintain Oxygen Saturation 88% or higher             Nutrition:  Target Goals: Understanding of nutrition guidelines, daily intake of sodium 1500mg , cholesterol 200mg , calories 30% from fat and 7% or less from saturated fats, daily to have 5 or more servings of fruits and vegetables.  Biometrics:  Pre Biometrics - 07/13/23 0917       Pre Biometrics   Height 5\' 6"  (1.676 m)    Weight 191 lb 12.8 oz (87 kg)    Waist Circumference 42 inches    Hip Circumference 41 inches    Waist to Hip Ratio 1.02 %    BMI (Calculated) 30.97    Grip Strength 28.9 kg    Single Leg Stand 31.35 seconds              Nutrition Therapy Plan and Nutrition Goals:   Nutrition Assessments:  MEDIFICTS Score Key: >=70 Need to make dietary changes  40-70 Heart Healthy Diet <= 40 Therapeutic Level Cholesterol Diet  Flowsheet Row CARDIAC REHAB PHASE II EXERCISE from 07/15/2023 in [facility] CARDIAC REHABILITATION  Picture Your Plate Total Score on Admission 40      Picture Your Plate Scores: <28 Unhealthy dietary pattern with much room for improvement. 41-50  Dietary pattern unlikely to meet recommendations for good health and room for improvement. 51-60 More healthful dietary pattern, with some room for improvement.  >60 Healthy dietary pattern, although there may be some specific behaviors that could be improved.    Nutrition Goals Re-Evaluation:   Nutrition Goals Discharge (Final Nutrition Goals Re-Evaluation):   Psychosocial: Target Goals: Acknowledge presence or absence of significant depression and/or stress, maximize coping skills, provide positive support system. Participant is able to verbalize types and ability to use techniques and skills needed for reducing stress and depression.  Initial Review & Psychosocial Screening:  Initial Psych Review & Screening - 07/08/23 0942       Initial Review   Current issues with Current Sleep Concerns      Family Dynamics   Good Support System? Yes      Barriers   Psychosocial barriers to participate in program The patient should benefit from training in stress management and relaxation.      Screening Interventions   Interventions Encouraged to exercise;To provide support and resources with identified  psychosocial needs;Provide feedback about the scores to participant    Expected Outcomes Short Term goal: Utilizing psychosocial counselor, staff and physician to assist with identification of specific Stressors or current issues interfering with healing process. Setting desired goal for each stressor or current issue identified.;Long Term Goal: Stressors or current issues are controlled or eliminated.;Short Term goal: Identification and review with participant of any Quality of Life or Depression concerns found by scoring the questionnaire.;Long Term goal: The participant improves quality of Life and PHQ9 Scores as seen by post scores and/or verbalization of changes             Quality of Life Scores:  Quality of Life - 07/13/23 1057       Quality of Life   Select Quality of Life       Quality of Life Scores   Health/Function Pre 28.43 %    Socioeconomic Pre 30 %    Psych/Spiritual Pre 30 %    Family Pre 24 %    GLOBAL Pre 28.38 %            Scores of 19 and below usually indicate a poorer quality of life in these areas.  A difference of  2-3 points is a clinically meaningful difference.  A difference of 2-3 points in the total score of the Quality of Life Index has been associated with significant improvement in overall quality of life, self-image, physical symptoms, and general health in studies assessing change in quality of life.  PHQ-9: Review Flowsheet       07/13/2023 10/02/2018  Depression screen PHQ 2/9  Decreased Interest 0 0  Down, Depressed, Hopeless 0 0  PHQ - 2 Score 0 0  Altered sleeping 0 -  Tired, decreased energy 0 -  Change in appetite 0 -  Feeling bad or failure about yourself  0 -  Trouble concentrating 0 -  Moving slowly or fidgety/restless 0 -  Suicidal thoughts 0 -  PHQ-9 Score 0 -    Details           Interpretation of Total Score  Total Score Depression Severity:  1-4 = Minimal depression, 5-9 = Mild depression, 10-14 = Moderate depression, 15-19 = Moderately severe depression, 20-27 = Severe depression   Psychosocial Evaluation and Intervention:  Psychosocial Evaluation - 07/08/23 0942       Psychosocial Evaluation & Interventions   Interventions Stress management education;Relaxation education;Encouraged to exercise with the program and follow exercise prescription    Comments Patient was referred to CR with CABGx3 from [facility]. He denies any depression or anxiety or stressors in his life. He does report trouble staying alseep due to frequent urination which has been a chronic problem for him for several years. He has tried sleepaides in the past but says he does not use anything currently. He lives alone. He has a son that lives in [place] whom he says plans to move near him soon. He is hopeful this will happen. He says  his sister is his main support person. She lives in the same apartment complex and checks on him everyday. She also helps him with his medications. He did not know what medications he was taking. He said he would bring a list with him to his orientation visit. He completed the 8th grade and says he is able to read some but not a lot. He said his sister would help him complete the paperwork for the program but he has not recieved the paperwork yet. His  goals for the program are to get better overall; improve his strength, stamina and energy and get back to normal. He has no barriers identified to complete CR.    Expected Outcomes Short Term: start the program and attend consistently. Long Term: meet his personal goals.    Continue Psychosocial Services  Follow up required by staff             Psychosocial Re-Evaluation:   Psychosocial Discharge (Final Psychosocial Re-Evaluation):   Vocational Rehabilitation: Provide vocational rehab assistance to qualifying candidates.   Vocational Rehab Evaluation & Intervention:  Vocational Rehab - 07/08/23 0936       Initial Vocational Rehab Evaluation & Intervention   Assessment shows need for Vocational Rehabilitation No      Vocational Rehab Re-Evaulation   Comments Patient is disabled and does not need vocational rehab.             Education: Education Goals: Education classes will be provided on a weekly basis, covering required topics. Participant will state understanding/return demonstration of topics presented.  Learning Barriers/Preferences:  Learning Barriers/Preferences - 07/08/23 0936       Learning Barriers/Preferences   Learning Barriers None    Learning Preferences Skilled Demonstration;Audio             Education Topics: Hypertension, Hypertension Reduction -Define heart disease and high blood pressure. Discus how high blood pressure affects the body and ways to reduce high blood pressure.   Exercise and Your  Heart -Discuss why it is important to exercise, the FITT principles of exercise, normal and abnormal responses to exercise, and how to exercise safely. Flowsheet Row CARDIAC REHAB PHASE II EXERCISE from 07/27/2023 in [place] CARDIAC REHABILITATION  Date 07/20/23  Educator [facility]  Instruction Review Code 1- Verbalizes Understanding       Angina -Discuss definition of angina, causes of angina, treatment of angina, and how to decrease risk of having angina.   Cardiac Medications -Review what the following cardiac medications are used for, how they affect the body, and side effects that may occur when taking the medications.  Medications include Aspirin, Beta blockers, calcium channel blockers, ACE Inhibitors, angiotensin receptor blockers, diuretics, digoxin, and antihyperlipidemics.   Congestive Heart Failure -Discuss the definition of CHF, how to live with CHF, the signs and symptoms of CHF, and how keep track of weight and sodium intake.   Heart Disease and Intimacy -Discus the effect sexual activity has on the heart, how changes occur during intimacy as we age, and safety during sexual activity. Flowsheet Row CARDIAC REHAB PHASE II EXERCISE from 07/27/2023 in [place] CARDIAC REHABILITATION  Date 07/27/23  Educator [facility]  Instruction Review Code 1- Verbalizes Understanding       Smoking Cessation / COPD -Discuss different methods to quit smoking, the health benefits of quitting smoking, and the definition of COPD.   Nutrition I: Fats -Discuss the types of cholesterol, what cholesterol does to the heart, and how cholesterol levels can be controlled.   Nutrition II: Labels -Discuss the different components of food labels and how to read food label   Heart Parts/Heart Disease and PAD -Discuss the anatomy of the heart, the pathway of blood circulation through the heart, and these are affected by heart disease.   Stress I: Signs and Symptoms -Discuss the causes of stress, how  stress may lead to anxiety and depression, and ways to limit stress.   Stress II: Relaxation -Discuss different types of relaxation techniques to limit stress.   Warning  Signs of Stroke / TIA -Discuss definition of a stroke, what the signs and symptoms are of a stroke, and how to identify when someone is having stroke.   Knowledge Questionnaire Score:  Knowledge Questionnaire Score - 07/13/23 0855       Knowledge Questionnaire Score   Pre Score 15/24             Core Components/Risk Factors/Patient Goals at Admission:  Personal Goals and Risk Factors at Admission - 07/08/23 0938       Core Components/Risk Factors/Patient Goals on Admission    Weight Management Weight Maintenance    Improve shortness of breath with ADL's Yes    Intervention Provide education, individualized exercise plan and daily activity instruction to help decrease symptoms of SOB with activities of daily living.    Expected Outcomes Short Term: Improve cardiorespiratory fitness to achieve a reduction of symptoms when performing ADLs;Long Term: Be able to perform more ADLs without symptoms or delay the onset of symptoms    Diabetes Yes    Intervention Provide education about signs/symptoms and action to take for hypo/hyperglycemia.;Provide education about proper nutrition, including hydration, and aerobic/resistive exercise prescription along with prescribed medications to achieve blood glucose in normal ranges: Fasting glucose 65-99 mg/dL    Expected Outcomes Short Term: Participant verbalizes understanding of the signs/symptoms and immediate care of hyper/hypoglycemia, proper foot care and importance of medication, aerobic/resistive exercise and nutrition plan for blood glucose control.;Long Term: Attainment of HbA1C < 7%.    Hypertension Yes    Intervention Monitor prescription use compliance.;Provide education on lifestyle modifcations including regular physical activity/exercise, weight management,  moderate sodium restriction and increased consumption of fresh fruit, vegetables, and low fat dairy, alcohol moderation, and smoking cessation.    Expected Outcomes Long Term: Maintenance of blood pressure at goal levels.;Short Term: Continued assessment and intervention until BP is < 140/1mm HG in hypertensive participants. < 130/95mm HG in hypertensive participants with diabetes, heart failure or chronic kidney disease.    Lipids Yes    Intervention Provide education and support for participant on nutrition & aerobic/resistive exercise along with prescribed medications to achieve LDL 70mg , HDL >40mg .    Expected Outcomes Short Term: Participant states understanding of desired cholesterol values and is compliant with medications prescribed. Participant is following exercise prescription and nutrition guidelines.;Long Term: Cholesterol controlled with medications as prescribed, with individualized exercise RX and with personalized nutrition plan. Value goals: LDL < 70mg , HDL > 40 mg.             Core Components/Risk Factors/Patient Goals Review:    Core Components/Risk Factors/Patient Goals at Discharge (Final Review):    ITP Comments:  ITP Comments     Row Name 07/13/23 1059 07/15/23 0803 08/03/23 0644       ITP Comments Patient arrived for 1st visit/orientation/education at 0800. 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 During orientation advised patient on arrival and appointment times what to wear, what to do before, during and after exercise. Reviewed attendance and class policy.  Pt is scheduled to return Cardiac Rehab on Friday 07/15/23 at 0800. Pt was advised to come to class 15 minutes before class starts.  Discussed RPE/Dpysnea scales. Patient participated in warm up stretches. Patient was able to complete 6 minute walk test.  Telemetry:NSR. Patient was measured for the equipment. Discussed equipment safety with patient. Took patient pre-anthropometric  measurements. Patient finished visit at 0900. First full day of exercise!  Patient was oriented to  gym and equipment including functions, settings, policies, and procedures.  Patient's individual exercise prescription and treatment plan were reviewed.  All starting workloads were established based on the results of the 6 minute walk test done at initial orientation visit.  The plan for exercise progression was also introduced and progression will be customized based on patient's performance and goals. 30 day review completed. ITP sent to Dr. Dina Rich, Medical Director of Cardiac Rehab. Continue with ITP unless changes are made by physician.              Comments: 30 day review

## 2023-08-05 ENCOUNTER — Encounter (HOSPITAL_COMMUNITY)
Admission: RE | Admit: 2023-08-05 | Discharge: 2023-08-05 | Disposition: A | Discharge status: Home / Self Care | Payer: Medicare Other | Source: Ambulatory Visit | Attending: Orthopedic Surgery | Admitting: Orthopedic Surgery

## 2023-08-05 DIAGNOSIS — Z951 Presence of aortocoronary bypass graft: Secondary | ICD-10-CM

## 2023-08-05 NOTE — Progress Notes (Signed)
Daily Session Note  Patient Details  Name: Cody Shepard MRN: [id number] Date of Birth: 1966-10-04 Referring Provider:   Flowsheet Row CARDIAC REHAB PHASE II ORIENTATION from 07/13/2023 in [facility] CARDIAC REHABILITATION  Referring Provider Dr. Mayford Knife       Encounter Date: 08/05/2023  Check In:  Session Check In - 08/05/23 0745       Check-In   Supervising physician immediately available to respond to emergencies See telemetry face sheet for immediately available MD    Location [facility]-Cardiac & Pulmonary Rehab    Staff Present Ross Ludwig, BS, Exercise Physiologist    Staff Present Cyndia Diver, RN, BSN, MA    Virtual Visit No    Medication changes reported     No    Fall or balance concerns reported    No    Tobacco Cessation No Change    Warm-up and Cool-down Performed on first and last piece of equipment    Resistance Training Performed Yes    VAD Patient? No    PAD/SET Patient? No      Pain Assessment   Currently in Pain? No/denies    Multiple Pain Sites No             Capillary Blood Glucose: No results found for this or any previous visit (from the past 24 hour(s)).    Social History   Tobacco Use  Smoking Status Former   Current packs/day: 0.00   Types: Cigarettes   Quit date: 08/30/1997   Years since quitting: 25.9  Smokeless Tobacco Never    Goals Met:  Independence with exercise equipment Exercise tolerated well No report of concerns or symptoms today Strength training completed today  Goals Unmet:  Not Applicable  Comments: Pt able to follow exercise prescription today without complaint.  Will continue to monitor for progression.

## 2023-08-08 ENCOUNTER — Encounter (HOSPITAL_COMMUNITY)
Admission: RE | Admit: 2023-08-08 | Discharge: 2023-08-08 | Disposition: A | Discharge status: Home / Self Care | Payer: Medicare Other | Source: Ambulatory Visit | Attending: Orthopedic Surgery | Admitting: Orthopedic Surgery

## 2023-08-08 DIAGNOSIS — Z951 Presence of aortocoronary bypass graft: Secondary | ICD-10-CM

## 2023-08-08 NOTE — Progress Notes (Signed)
Daily Session Note  Patient Details  Name: Cody Shepard MRN: [id number] Date of Birth: [birth year] Referring Provider:   Flowsheet Row CARDIAC REHAB PHASE II ORIENTATION from 07/13/2023 in [facility] CARDIAC REHABILITATION  Referring Provider Dr. Mayford Knife       Encounter Date: 08/08/2023  Check In:  Session Check In - 08/08/23 0745       Check-In   Supervising physician immediately available to respond to emergencies See telemetry face sheet for immediately available MD    Location [facility]    Staff Present Ross Ludwig, BS, Exercise Physiologist;Jessica Juanetta Gosling, MA, RCEP, CCRP, CCET    Staff Present Cyndia Diver, RN, BSN, MA    Virtual Visit No    Medication changes reported     No    Fall or balance concerns reported    No    Tobacco Cessation No Change    Warm-up and Cool-down Performed on first and last piece of equipment    Resistance Training Performed Yes    VAD Patient? No    PAD/SET Patient? No      Pain Assessment   Currently in Pain? No/denies    Multiple Pain Sites No             Capillary Blood Glucose: No results found for this or any previous visit (from the past 24 hour(s)).    Social History   Tobacco Use  Smoking Status Former   Current packs/day: 0.00   Types: Cigarettes   Quit date: 08/30/1997   Years since quitting: 25.9  Smokeless Tobacco Never    Goals Met:  Independence with exercise equipment Exercise tolerated well No report of concerns or symptoms today Strength training completed today  Goals Unmet:  Not Applicable  Comments: Pt able to follow exercise prescription today without complaint.  Will continue to monitor for progression.

## 2023-08-10 ENCOUNTER — Encounter (HOSPITAL_COMMUNITY)
Admission: RE | Admit: 2023-08-10 | Discharge: 2023-08-10 | Disposition: A | Discharge status: Home / Self Care | Payer: Medicare Other | Source: Ambulatory Visit | Attending: Orthopedic Surgery | Admitting: Orthopedic Surgery

## 2023-08-10 DIAGNOSIS — Z951 Presence of aortocoronary bypass graft: Secondary | ICD-10-CM

## 2023-08-10 NOTE — Progress Notes (Signed)
Daily Session Note  Patient Details  Name: Bradd Merlos MRN: [id number] Date of Birth: 05-11-1966 Referring Provider:   Flowsheet Row CARDIAC REHAB PHASE II ORIENTATION from 07/13/2023 in [facility] CARDIAC REHABILITATION  Referring Provider Dr. Mayford Knife       Encounter Date: 08/10/2023  Check In:  Session Check In - 08/10/23 0806       Check-In   Supervising physician immediately available to respond to emergencies See telemetry face sheet for immediately available MD    Location [facility]Card[facility]    Staff Present Ross Ludwig, BS, Exercise Physiologist;Jaylynn Siefert Juanetta Gosling, MA, RCEP, CCRP, Dow Adolph, RN, BSN    Virtual Visit No    Medication changes reported     No    Fall or balance concerns reported    No    Warm-up and Cool-down Performed on first and last piece of equipment    Resistance Training Performed Yes    VAD Patient? No    PAD/SET Patient? No      Pain Assessment   Currently in Pain? No/denies             Capillary Blood Glucose: No results found for this or any previous visit (from the past 24 hour(s)).    Social History   Tobacco Use  Smoking Status Former   Current packs/day: 0.00   Types: Cigarettes   Quit date: 08/30/1997   Years since quitting: 25.9  Smokeless Tobacco Never    Goals Met:  Independence with exercise equipment Exercise tolerated well No report of concerns or symptoms today Strength training completed today  Goals Unmet:  Not Applicable  Comments: Pt able to follow exercise prescription today without complaint.  Will continue to monitor for progression.

## 2023-08-12 ENCOUNTER — Encounter (HOSPITAL_COMMUNITY)
Admission: RE | Admit: 2023-08-12 | Discharge: 2023-08-12 | Disposition: A | Discharge status: Home / Self Care | Payer: Medicare Other | Source: Ambulatory Visit | Attending: Orthopedic Surgery | Admitting: Orthopedic Surgery

## 2023-08-12 DIAGNOSIS — Z951 Presence of aortocoronary bypass graft: Secondary | ICD-10-CM

## 2023-08-12 NOTE — Progress Notes (Signed)
Daily Session Note  Patient Details  Name: Cody Shepard MRN: [id number] Date of Birth: [birth year] Referring Provider:   Flowsheet Row CARDIAC REHAB PHASE II ORIENTATION from 07/13/2023 in [facility] CARDIAC REHABILITATION  Referring Provider Dr. Mayford Knife       Encounter Date: 08/12/2023  Check In:  Session Check In - 08/12/23 0759       Check-In   Supervising physician immediately available to respond to emergencies See telemetry face sheet for immediately available MD    Location [facility]    Staff Present Enid Derry, RN, BSN;Elizabet Schweppe, MA, RCEP, CCRP, CCET    Virtual Visit No    Medication changes reported     No    Fall or balance concerns reported    No    Tobacco Cessation No Change    Warm-up and Cool-down Performed on first and last piece of equipment    Resistance Training Performed Yes    VAD Patient? No    PAD/SET Patient? No      Pain Assessment   Currently in Pain? No/denies             Capillary Blood Glucose: No results found for this or any previous visit (from the past 24 hour(s)).    Social History   Tobacco Use  Smoking Status Former   Current packs/day: 0.00   Types: Cigarettes   Quit date: 08/30/1997   Years since quitting: 25.9  Smokeless Tobacco Never    Goals Met:  Independence with exercise equipment Exercise tolerated well Personal goals reviewed No report of concerns or symptoms today Strength training completed today  Goals Unmet:  Not Applicable  Comments: Pt able to follow exercise prescription today without complaint.  Will continue to monitor for progression. Reviewed home exercise with pt today.  Pt plans to walk at home for exercise.  Reviewed THR, pulse, RPE, sign and symptoms, pulse oximetery and when to call 911 or MD.  Also discussed weather considerations and indoor options.  Pt voiced understanding.

## 2023-08-15 ENCOUNTER — Encounter (HOSPITAL_COMMUNITY)
Admission: RE | Admit: 2023-08-15 | Discharge: 2023-08-15 | Disposition: A | Discharge status: Home / Self Care | Payer: Medicare Other | Source: Ambulatory Visit | Attending: Orthopedic Surgery | Admitting: Orthopedic Surgery

## 2023-08-15 DIAGNOSIS — Z951 Presence of aortocoronary bypass graft: Secondary | ICD-10-CM | POA: Diagnosis not present

## 2023-08-15 NOTE — Progress Notes (Signed)
Daily Session Note  Patient Details  Name: Cody Shepard MRN: [id number] Date of Birth: January 06, 1966 Referring Provider:   Flowsheet Row CARDIAC REHAB PHASE II ORIENTATION from 07/13/2023 in [facility] CARDIAC REHABILITATION  Referring Provider Dr. Mayford Knife       Encounter Date: 08/15/2023  Check In:  Session Check In - 08/15/23 0752       Check-In   Supervising physician immediately available to respond to emergencies See telemetry face sheet for immediately available MD    Location [facility]    Staff Present Fabio Pierce, MA, RCEP, CCRP, CCET;Heather Fredric Mare, BS, Exercise Physiologist;Phyllis Billingsley, RN    Virtual Visit No    Medication changes reported     No    Fall or balance concerns reported    No    Warm-up and Cool-down Performed on first and last piece of equipment    Resistance Training Performed Yes    VAD Patient? No    PAD/SET Patient? No      Pain Assessment   Currently in Pain? No/denies             Capillary Blood Glucose: No results found for this or any previous visit (from the past 24 hour(s)).    Social History   Tobacco Use  Smoking Status Former   Current packs/day: 0.00   Types: Cigarettes   Quit date: 08/30/1997   Years since quitting: 25.9  Smokeless Tobacco Never    Goals Met:  Independence with exercise equipment Exercise tolerated well No report of concerns or symptoms today Strength training completed today  Goals Unmet:  Not Applicable  Comments: Pt able to follow exercise prescription today without complaint.  Will continue to monitor for progression.

## 2023-08-17 ENCOUNTER — Encounter (HOSPITAL_COMMUNITY): Payer: Medicare Other

## 2023-08-19 ENCOUNTER — Encounter (HOSPITAL_COMMUNITY)
Admission: RE | Admit: 2023-08-19 | Discharge: 2023-08-19 | Disposition: A | Discharge status: Home / Self Care | Payer: Medicare Other | Source: Ambulatory Visit | Attending: Orthopedic Surgery | Admitting: Orthopedic Surgery

## 2023-08-19 DIAGNOSIS — Z951 Presence of aortocoronary bypass graft: Secondary | ICD-10-CM

## 2023-08-19 NOTE — Progress Notes (Signed)
Daily Session Note  Patient Details  Name: Cody Shepard MRN: [id number] Date of Birth: 07-25-1966 Referring Provider:   Flowsheet Row CARDIAC REHAB PHASE II ORIENTATION from 07/13/2023 in [facility] CARDIAC REHABILITATION  Referring Provider Dr. Mayford Knife       Encounter Date: 08/19/2023  Check In:  Session Check In - 08/19/23 0745       Check-In   Supervising physician immediately available to respond to emergencies See telemetry face sheet for immediately available MD    Location [facility]    Staff Present [facility] BSN, RN;Jessica [place], [place], RCEP, CCRP, CCET    Virtual Visit No    Medication changes reported     No    Fall or balance concerns reported    No    Tobacco Cessation No Change    Warm-up and Cool-down Performed on first and last piece of equipment    Resistance Training Performed Yes    VAD Patient? No    PAD/SET Patient? No      Pain Assessment   Currently in Pain? No/denies    Multiple Pain Sites No             Capillary Blood Glucose: No results found for this or any previous visit (from the past 24 hour(s)).    Social History   Tobacco Use  Smoking Status Former   Current packs/day: 0.00   Types: Cigarettes   Quit date: 08/30/1997   Years since quitting: 25.9  Smokeless Tobacco Never    Goals Met:  Independence with exercise equipment Exercise tolerated well No report of concerns or symptoms today Strength training completed today  Goals Unmet:  Not Applicable  Comments: Marland KitchenMarland KitchenPt able to follow exercise prescription today without complaint.  Will continue to monitor for progression.

## 2023-08-22 ENCOUNTER — Encounter (HOSPITAL_COMMUNITY)
Admission: RE | Admit: 2023-08-22 | Discharge: 2023-08-22 | Disposition: A | Discharge status: Home / Self Care | Payer: Medicare Other | Source: Ambulatory Visit | Attending: Orthopedic Surgery | Admitting: Orthopedic Surgery

## 2023-08-22 DIAGNOSIS — Z951 Presence of aortocoronary bypass graft: Secondary | ICD-10-CM | POA: Diagnosis not present

## 2023-08-22 NOTE — Progress Notes (Signed)
Daily Session Note  Patient Details  Name: Cody Shepard MRN: [id number] Date of Birth: 06/07/1966 Referring Provider:   Flowsheet Row CARDIAC REHAB PHASE II ORIENTATION from 07/13/2023 in [facility] CARDIAC REHABILITATION  Referring Provider Dr. Mayford Knife       Encounter Date: 08/22/2023  Check In:  Session Check In - 08/22/23 0753       Check-In   Supervising physician immediately available to respond to emergencies See telemetry face sheet for immediately available MD    Location [facility]-Card[facility] Pul[facility]    Staff Present Fabio Pierce, MA, RCEP, CCRP, CCET    Staff Present Cyndia Diver, RN, BSN, MA    Virtual Visit No    Medication changes reported     No    Fall or balance concerns reported    No    Warm-up and Cool-down Performed on first and last piece of equipment    Resistance Training Performed Yes    VAD Patient? No    PAD/SET Patient? No      Pain Assessment   Currently in Pain? No/denies             Capillary Blood Glucose: No results found for this or any previous visit (from the past 24 hour(s)).    Social History   Tobacco Use  Smoking Status Former   Current packs/day: 0.00   Types: Cigarettes   Quit date: 08/30/1997   Years since quitting: 25.9  Smokeless Tobacco Never    Goals Met:  Independence with exercise equipment Exercise tolerated well No report of concerns or symptoms today Strength training completed today  Goals Unmet:  Not Applicable  Comments: Pt able to follow exercise prescription today without complaint.  Will continue to monitor for progression.

## 2023-08-24 ENCOUNTER — Encounter (HOSPITAL_COMMUNITY): Payer: Medicare Other

## 2023-08-26 ENCOUNTER — Encounter (HOSPITAL_COMMUNITY)
Admission: RE | Admit: 2023-08-26 | Discharge: 2023-08-26 | Disposition: A | Discharge status: Home / Self Care | Payer: Medicare Other | Source: Ambulatory Visit | Attending: Orthopedic Surgery | Admitting: Orthopedic Surgery

## 2023-08-26 DIAGNOSIS — Z951 Presence of aortocoronary bypass graft: Secondary | ICD-10-CM | POA: Insufficient documentation

## 2023-08-26 NOTE — Progress Notes (Signed)
Daily Session Note  Patient Details  Name: Cody Shepard MRN: [id number] Date of Birth: 1966-02-10 Referring Provider:   Flowsheet Row CARDIAC REHAB PHASE II ORIENTATION from 07/13/2023 in [facility] CARDIAC REHABILITATION  Referring Provider Dr. Mayford Knife       Encounter Date: 08/26/2023  Check In:  Session Check In - 08/26/23 0745       Check-In   Supervising physician immediately available to respond to emergencies See telemetry face sheet for immediately available MD    Location [facility]    Staff Present Ross Ludwig, BS, Exercise Physiologist;Jessica Juanetta Gosling, MA, RCEP, CCRP, Dow Adolph, RN, BSN    Virtual Visit No    Medication changes reported     No    Fall or balance concerns reported    No    Tobacco Cessation No Change    Warm-up and Cool-down Performed on first and last piece of equipment    Resistance Training Performed Yes    VAD Patient? No    PAD/SET Patient? No      Pain Assessment   Currently in Pain? No/denies    Multiple Pain Sites No             Capillary Blood Glucose: No results found for this or any previous visit (from the past 24 hour(s)).    Social History   Tobacco Use  Smoking Status Former   Current packs/day: 0.00   Types: Cigarettes   Quit date: 08/30/1997   Years since quitting: 26.0  Smokeless Tobacco Never    Goals Met:  Independence with exercise equipment Exercise tolerated well No report of concerns or symptoms today Strength training completed today  Goals Unmet:  Not Applicable  Comments: Pt able to follow exercise prescription today without complaint.  Will continue to monitor for progression.

## 2023-08-29 ENCOUNTER — Encounter (HOSPITAL_COMMUNITY)
Admission: RE | Admit: 2023-08-29 | Discharge: 2023-08-29 | Disposition: A | Discharge status: Home / Self Care | Payer: Medicare Other | Source: Ambulatory Visit | Attending: Orthopedic Surgery | Admitting: Orthopedic Surgery

## 2023-08-29 DIAGNOSIS — Z951 Presence of aortocoronary bypass graft: Secondary | ICD-10-CM

## 2023-08-29 NOTE — Progress Notes (Signed)
Daily Session Note  Patient Details  Name: Cody Shepard MRN: [id number] Date of Birth: 08/19/1966 Referring Provider:   Flowsheet Row CARDIAC REHAB PHASE II ORIENTATION from 07/13/2023 in [facility] CARDIAC REHABILITATION  Referring Provider Dr. Mayford Knife       Encounter Date: 08/29/2023  Check In:  Session Check In - 08/29/23 0800       Check-In   Supervising physician immediately available to respond to emergencies See telemetry face sheet for immediately available ER MD    Location [facility]    Staff Present Rodena Medin, RN, BSN;Heather Fredric Mare, BS, Exercise Physiologist;Jessica Juanetta Gosling, MA, RCEP, CCRP, CCET    Virtual Visit No    Medication changes reported     No    Fall or balance concerns reported    No    Warm-up and Cool-down Performed on first and last piece of equipment    Resistance Training Performed Yes    VAD Patient? No    PAD/SET Patient? No      Pain Assessment   Currently in Pain? No/denies    Multiple Pain Sites No             Capillary Blood Glucose: No results found for this or any previous visit (from the past 24 hour(s)).    Social History   Tobacco Use  Smoking Status Former   Current packs/day: 0.00   Types: Cigarettes   Quit date: 08/30/1997   Years since quitting: 26.0  Smokeless Tobacco Never    Goals Met:  Independence with exercise equipment Exercise tolerated well No report of concerns or symptoms today Strength training completed today  Goals Unmet:  Not Applicable  Comments: Pt able to follow exercise prescription today without complaint.  Will continue to monitor for progression.

## 2023-08-31 ENCOUNTER — Encounter (HOSPITAL_COMMUNITY): Payer: Self-pay | Admitting: *Deleted

## 2023-08-31 ENCOUNTER — Encounter (HOSPITAL_COMMUNITY)
Admission: RE | Admit: 2023-08-31 | Discharge: 2023-08-31 | Disposition: A | Discharge status: Home / Self Care | Payer: Medicare Other | Source: Ambulatory Visit | Attending: Orthopedic Surgery | Admitting: Orthopedic Surgery

## 2023-08-31 DIAGNOSIS — Z951 Presence of aortocoronary bypass graft: Secondary | ICD-10-CM | POA: Diagnosis not present

## 2023-08-31 NOTE — Progress Notes (Signed)
Cardiac Individual Treatment Plan  Patient Details  Name: Cody Shepard MRN: [id number] Date of Birth: 1965-11-09 Referring Provider:   Flowsheet Row CARDIAC REHAB PHASE II ORIENTATION from 07/13/2023 in [facility] CARDIAC REHABILITATION  Referring Provider Dr. Mayford Knife       Initial Encounter Date:  Flowsheet Row CARDIAC REHAB PHASE II ORIENTATION from 07/13/2023 in [place] CARDIAC REHABILITATION  Date 07/13/23       Visit Diagnosis: S/P CABG x 3  Patient's Home Medications on Admission:  Current Outpatient Medications:    albuterol (PROVENTIL HFA;VENTOLIN HFA) 108 (90 BASE) MCG/ACT inhaler, Inhale 2 puffs into the lungs every 4 (four) hours as needed for wheezing or shortness of breath.  (Patient not taking: Reported on 07/15/2023), Disp: , Rfl:    alfuzosin (UROXATRAL) 10 MG 24 hr tablet, Take 1 tablet (10 mg total) by mouth at bedtime. (Patient not taking: Reported on 07/15/2023), Disp: 30 tablet, Rfl: 11   AMBULATORY NON FORMULARY MEDICATION, 0.2 mLs by Intracavernosal route as needed. Medication Name: Trimix  PGE Pap 30mg  Phent 1mg  (Patient not taking: Reported on 07/15/2023), Disp: 5 mL, Rfl: 5   amLODipine (NORVASC) 2.5 MG tablet, Take 2.5 mg by mouth daily., Disp: , Rfl:    atorvastatin (LIPITOR) 80 MG tablet, Take 80 mg by mouth daily., Disp: , Rfl:    carvedilol (COREG) 25 MG tablet, Take 25 mg by mouth 2 (two) times daily with a meal. Breakfast & supper (Patient not taking: Reported on 07/15/2023), Disp: , Rfl:    cloNIDine (CATAPRES - DOSED IN MG/24 HR) 0.1 mg/24hr patch, Place 0.1 mg onto the skin every Sunday. (Patient not taking: Reported on 07/15/2023), Disp: , Rfl:    diclofenac Sodium (VOLTAREN) 1 % GEL, Apply 1 g topically 4 (four) times daily as needed (pain.). (Patient not taking: Reported on 07/15/2023), Disp: , Rfl:    ELIQUIS 5 MG TABS tablet, Take 5 mg by mouth every 12 (twelve) hours., Disp: , Rfl:    fluticasone (FLONASE) 50 MCG/ACT nasal spray, Place 2  sprays into both nostrils daily.  (Patient not taking: Reported on 07/15/2023), Disp: , Rfl:    Fluticasone-Umeclidin-Vilant (TRELEGY ELLIPTA) 100-62.5-25 MCG/INH AEPB, Inhale 1 Inhaler into the lungs daily. (Patient not taking: Reported on 07/15/2023), Disp: , Rfl:    hydrALAZINE (APRESOLINE) 50 MG tablet, Take 50 mg by mouth in the morning and at bedtime. (Patient not taking: Reported on 07/15/2023), Disp: , Rfl:    HYDROcodone-acetaminophen (NORCO) 5-325 MG tablet, Take 1 tablet by mouth every 4 (four) hours as needed for moderate pain., Disp: 40 tablet, Rfl: 0   isosorbide mononitrate (IMDUR) 60 MG 24 hr tablet, Take 60 mg by mouth in the morning and at bedtime.  (Patient not taking: Reported on 07/15/2023), Disp: , Rfl:    metFORMIN (GLUCOPHAGE) 500 MG tablet, Take 500 mg by mouth 2 (two) times daily. (Patient not taking: Reported on 07/15/2023), Disp: , Rfl:    metoprolol tartrate (LOPRESSOR) 25 MG tablet, Take 25 mg by mouth 2 (two) times daily., Disp: , Rfl:    nitroGLYCERIN (NITROSTAT) 0.4 MG SL tablet, Place 0.4 mg under the tongue every 5 (five) minutes x 3 doses as needed for chest pain., Disp: , Rfl:    olmesartan (BENICAR) 20 MG tablet, Take 20 mg by mouth daily. (Patient not taking: Reported on 07/15/2023), Disp: , Rfl:    ranolazine (RANEXA) 500 MG 12 hr tablet, Take 500 mg by mouth 2 (two) times daily. (Patient not taking: Reported on  07/15/2023), Disp: , Rfl:    tamsulosin (FLOMAX) 0.4 MG CAPS capsule, Take 0.4 mg by mouth daily. (Patient not taking: Reported on 07/15/2023), Disp: , Rfl:    vitamin C (ASCORBIC ACID) 250 MG tablet, Take 250 mg by mouth daily. (Patient not taking: Reported on 07/15/2023), Disp: , Rfl:   Past Medical History: Past Medical History:  Diagnosis Date   Asthma    Bronchitis    COPD (chronic obstructive pulmonary disease) (HCC)    Coronary artery disease    Diabetes mellitus without complication (HCC)    Headache    High cholesterol    Hypertension      Tobacco Use: Social History   Tobacco Use  Smoking Status Former   Current packs/day: 0.00   Types: Cigarettes   Quit date: 08/30/1997   Years since quitting: 26.0  Smokeless Tobacco Never    Labs: Review Flowsheet       Latest Ref Rng & Units 08/10/2018 01/03/2019 03/10/2019 01/14/2020  Labs for ITP Cardiac and Pulmonary Rehab  Cholestrol 0 - 200 mg/dL - - 355  -  LDL (calc) 0 - 99 mg/dL - - 732  -  HDL-C >20 mg/dL - - 35  -  Trlycerides <150 mg/dL - - 254  -  Hemoglobin A1c 4.8 - 5.6 % 6.2  5.5  6.3  6.3     Details            Capillary Blood Glucose: Lab Results  Component Value Date   GLUCAP 92 07/20/2023   GLUCAP 128 (H) 07/20/2023   GLUCAP 230 (H) 07/18/2023   GLUCAP 145 (H) 07/15/2023   GLUCAP 225 (H) 07/15/2023     Exercise Target Goals: Exercise Program Goal: Individual exercise prescription set using results from initial 6 min walk test and THRR while considering  patient's activity barriers and safety.   Exercise Prescription Goal: Starting with aerobic activity 30 plus minutes a day, 3 days per week for initial exercise prescription. Provide home exercise prescription and guidelines that participant acknowledges understanding prior to discharge.  Activity Barriers & Risk Stratification:  Activity Barriers & Cardiac Risk Stratification - 07/08/23 0916       Activity Barriers & Cardiac Risk Stratification   Activity Barriers Shortness of Breath;Incisional Pain;Chest Pain/Angina;Assistive Device    Cardiac Risk Stratification High             6 Minute Walk:  6 Minute Walk     Row Name 07/13/23 0913         6 Minute Walk   Phase Initial     Distance 1330 feet     Walk Time 6 minutes     # of Rest Breaks 0     MPH 2.51     METS 3.32     RPE 11     VO2 Peak 11.64     Symptoms No     Resting HR 83 bpm     Resting BP 126/84     Resting Oxygen Saturation  99 %     Exercise Oxygen Saturation  during 6 min walk 98 %     Max Ex. HR  95 bpm     Max Ex. BP 136/86     2 Minute Post BP 124/80              Oxygen Initial Assessment:   Oxygen Re-Evaluation:   Oxygen Discharge (Final Oxygen Re-Evaluation):   Initial Exercise Prescription:  Initial Exercise Prescription - 07/13/23 0900  Date of Initial Exercise RX and Referring Provider   Date 07/13/23    Referring Provider Dr. Mayford Knife      Oxygen   Maintain Oxygen Saturation 88% or higher      Treadmill   MPH 2    Grade 0    Minutes 15      Elliptical   Level 2    Speed 50    Minutes 15      Prescription Details   Frequency (times per week) 3    Duration Progress to 30 minutes of continuous aerobic without signs/symptoms of physical distress      Intensity   THRR 40-80% of Max Heartrate 114-145    Ratings of Perceived Exertion 11-13    Perceived Dyspnea 0-4      Resistance Training   Training Prescription Yes    Weight 4    Reps 10-15             Perform Capillary Blood Glucose checks as needed.  Exercise Prescription Changes:   Exercise Prescription Changes     Row Name 07/18/23 1300 08/12/23 0800 08/15/23 1200 08/29/23 1200       Response to Exercise   Blood Pressure (Admit) 120/64 -- 98/60 106/60    Blood Pressure (Exercise) 126/74 -- -- --    Blood Pressure (Exit) 110/60 -- 96/66 90/60    Heart Rate (Admit) 101 bpm -- 96 bpm 78 bpm    Heart Rate (Exercise) 130 bpm -- 135 bpm 146 bpm    Heart Rate (Exit) 107 bpm -- 101 bpm 107 bpm    Rating of Perceived Exertion (Exercise) 10 -- 12 12    Duration Continue with 30 min of aerobic exercise without signs/symptoms of physical distress. -- Continue with 30 min of aerobic exercise without signs/symptoms of physical distress. Continue with 30 min of aerobic exercise without signs/symptoms of physical distress.    Intensity THRR unchanged -- THRR unchanged THRR unchanged      Progression   Progression Continue to progress workloads to maintain intensity without  signs/symptoms of physical distress. -- Continue to progress workloads to maintain intensity without signs/symptoms of physical distress. Continue to progress workloads to maintain intensity without signs/symptoms of physical distress.      Resistance Training   Training Prescription Yes -- Yes Yes    Weight 5 -- 5 lbs 5lbs    Reps 10-15 -- 10-15 10-15    Time 1 Minutes -- -- --      Treadmill   MPH 1.8 -- 2.5 --    Grade 0 -- 1.5 --    Minutes 15 -- 15 --    METs 2.38 -- 3.43 --      NuStep   Level 2 -- -- --    SPM 101 -- -- --    Minutes 15 -- -- --    METs 2.8 -- -- --      Elliptical   Level 2 -- 4 4    Speed 49 -- 50 44    Minutes 15 -- 15 30    METs 2.5 -- 4 2.9      Home Exercise Plan   Plans to continue exercise at -- Home (comment)  walking Home (comment) Home (comment)    Frequency -- Add 2 additional days to program exercise sessions. Add 2 additional days to program exercise sessions. Add 2 additional days to program exercise sessions.    Initial Home Exercises Provided -- 08/12/23 -- --  Oxygen   Maintain Oxygen Saturation 88% or higher -- 88% or higher 88% or higher             Exercise Comments:   Exercise Goals and Review:   Exercise Goals     Row Name 07/13/23 0916             Exercise Goals   Increase Physical Activity Yes       Intervention Provide advice, education, support and counseling about physical activity/exercise needs.;Develop an individualized exercise prescription for aerobic and resistive training based on initial evaluation findings, risk stratification, comorbidities and participant's personal goals.       Expected Outcomes Short Term: Attend rehab on a regular basis to increase amount of physical activity.;Long Term: Add in home exercise to make exercise part of routine and to increase amount of physical activity.;Long Term: Exercising regularly at least 3-5 days a week.       Increase Strength and Stamina Yes        Intervention Provide advice, education, support and counseling about physical activity/exercise needs.;Develop an individualized exercise prescription for aerobic and resistive training based on initial evaluation findings, risk stratification, comorbidities and participant's personal goals.       Expected Outcomes Short Term: Increase workloads from initial exercise prescription for resistance, speed, and METs.;Short Term: Perform resistance training exercises routinely during rehab and add in resistance training at home;Long Term: Improve cardiorespiratory fitness, muscular endurance and strength as measured by increased METs and functional capacity ( )       Able to understand and use rate of perceived exertion (RPE) scale Yes       Intervention Provide education and explanation on how to use RPE scale       Expected Outcomes Short Term: Able to use RPE daily in rehab to express subjective intensity level;Long Term:  Able to use RPE to guide intensity level when exercising independently       Knowledge and understanding of Target Heart Rate Range (THRR) Yes       Intervention Provide education and explanation of THRR including how the numbers were predicted and where they are located for reference       Expected Outcomes Short Term: Able to state/look up THRR;Long Term: Able to use THRR to govern intensity when exercising independently;Short Term: Able to use daily as guideline for intensity in rehab       Able to check pulse independently Yes       Intervention Provide education and demonstration on how to check pulse in carotid and radial arteries.;Review the importance of being able to check your own pulse for safety during independent exercise       Expected Outcomes Short Term: Able to explain why pulse checking is important during independent exercise;Long Term: Able to check pulse independently and accurately       Understanding of Exercise Prescription Yes       Intervention Provide  education, explanation, and written materials on patient's individual exercise prescription       Expected Outcomes Short Term: Able to explain program exercise prescription;Long Term: Able to explain home exercise prescription to exercise independently                Exercise Goals Re-Evaluation :  Exercise Goals Re-Evaluation     Row Name 07/18/23 1313 08/12/23 0803 08/30/23 0731         Exercise Goal Re-Evaluation   Exercise Goals Review Increase Physical Activity;Able to understand and use Dyspnea scale;Understanding  of Exercise Prescription Increase Physical Activity;Increase Strength and Stamina;Able to understand and use rate of perceived exertion (RPE) scale;Able to understand and use Dyspnea scale;Knowledge and understanding of Target Heart Rate Range (THRR);Able to check pulse independently;Understanding of Exercise Prescription Increase Physical Activity;Able to understand and use rate of perceived exertion (RPE) scale;Understanding of Exercise Prescription     Comments Gonsalo has just started rehab and has been to two sessions. He has been doing well, walking at 1.8 speed on the treadmill and level 2 on the REX-XR. Will continue to monitor and progress as able. Vash is doing well in rehab.  He is already walking some at home and starting to get his stamina back.  Reviewed home exercise with pt today.  Pt plans to walk at home for exercise.  Reviewed THR, pulse, RPE, sign and symptoms, pulse oximetery and when to call 911 or MD.  Also discussed weather considerations and indoor options.  Pt voiced understanding. Everson is doing well in rehab. He has not increased on his levels for the treadmill or XR (level 4). He has been coming on and off to class and sometimes is late. HE stated that he has felt tired recently and runs out of energy around the middle of the day. He is currently exercising at 2.9 METs. Will continue to monitor and progress as able     Expected Outcomes Short term:  increase walking speed to 2.0   long term:continue to attend rehab Short: Continue to add in walking at home Long: Continue to exercise independently Short: Continue to add in walking at home Long: Continue to attend rehab and exercise independently               Discharge Exercise Prescription (Final Exercise Prescription Changes):  Exercise Prescription Changes - 08/29/23 1200       Response to Exercise   Blood Pressure (Admit) 106/60    Blood Pressure (Exit) 90/60    Heart Rate (Admit) 78 bpm    Heart Rate (Exercise) 146 bpm    Heart Rate (Exit) 107 bpm    Rating of Perceived Exertion (Exercise) 12    Duration Continue with 30 min of aerobic exercise without signs/symptoms of physical distress.    Intensity THRR unchanged      Progression   Progression Continue to progress workloads to maintain intensity without signs/symptoms of physical distress.      Resistance Training   Training Prescription Yes    Weight 5lbs    Reps 10-15      Elliptical   Level 4    Speed 44    Minutes 30    METs 2.9      Home Exercise Plan   Plans to continue exercise at Home (comment)    Frequency Add 2 additional days to program exercise sessions.      Oxygen   Maintain Oxygen Saturation 88% or higher             Nutrition:  Target Goals: Understanding of nutrition guidelines, daily intake of sodium 1500mg , cholesterol 200mg , calories 30% from fat and 7% or less from saturated fats, daily to have 5 or more servings of fruits and vegetables.  Biometrics:  Pre Biometrics - 07/13/23 0917       Pre Biometrics   Height 5\' 6"  (1.676 m)    Weight 191 lb 12.8 oz (87 kg)    Waist Circumference 42 inches    Hip Circumference 41 inches    Waist to Hip Ratio  1.02 %    BMI (Calculated) 30.97    Grip Strength 28.9 kg    Single Leg Stand 31.35 seconds              Nutrition Therapy Plan and Nutrition Goals:   Nutrition Assessments:  MEDIFICTS Score Key: >=70 Need to  make dietary changes  40-70 Heart Healthy Diet <= 40 Therapeutic Level Cholesterol Diet  Flowsheet Row CARDIAC REHAB PHASE II EXERCISE from 07/15/2023 in [facility] CARDIAC REHABILITATION  Picture Your Plate Total Score on Admission 40      Picture Your Plate Scores: <65 Unhealthy dietary pattern with much room for improvement. 41-50 Dietary pattern unlikely to meet recommendations for good health and room for improvement. 51-60 More healthful dietary pattern, with some room for improvement.  >60 Healthy dietary pattern, although there may be some specific behaviors that could be improved.    Nutrition Goals Re-Evaluation:  Nutrition Goals Re-Evaluation     Row Name 08/12/23 0806             Goals   Nutrition Goal Heart Healthy Diet       Comment Daiden attended nutrition education on Wednesday.  He feels like he has a good handle on his diet.  He is trying to get a good variety of fruits and vegetables in.  He also tries to get enough protein.  He is doing well staying away from salt and no longer likes the taste of salt.       Expected Outcome Short: Continue to add in fruits and vegetables Long: Continue to follow heart healthy diet                Nutrition Goals Discharge (Final Nutrition Goals Re-Evaluation):  Nutrition Goals Re-Evaluation - 08/12/23 0806       Goals   Nutrition Goal Heart Healthy Diet    Comment Renae Fickle attended nutrition education on Wednesday.  He feels like he has a good handle on his diet.  He is trying to get a good variety of fruits and vegetables in.  He also tries to get enough protein.  He is doing well staying away from salt and no longer likes the taste of salt.    Expected Outcome Short: Continue to add in fruits and vegetables Long: Continue to follow heart healthy diet             Psychosocial: Target Goals: Acknowledge presence or absence of significant depression and/or stress, maximize coping skills, provide positive support  system. Participant is able to verbalize types and ability to use techniques and skills needed for reducing stress and depression.  Initial Review & Psychosocial Screening:  Initial Psych Review & Screening - 07/08/23 0942       Initial Review   Current issues with Current Sleep Concerns      Family Dynamics   Good Support System? Yes      Barriers   Psychosocial barriers to participate in program The patient should benefit from training in stress management and relaxation.      Screening Interventions   Interventions Encouraged to exercise;To provide support and resources with identified psychosocial needs;Provide feedback about the scores to participant    Expected Outcomes Short Term goal: Utilizing psychosocial counselor, staff and physician to assist with identification of specific Stressors or current issues interfering with healing process. Setting desired goal for each stressor or current issue identified.;Long Term Goal: Stressors or current issues are controlled or eliminated.;Short Term goal: Identification and review with  participant of any Quality of Life or Depression concerns found by scoring the questionnaire.;Long Term goal: The participant improves quality of Life and PHQ9 Scores as seen by post scores and/or verbalization of changes             Quality of Life Scores:  Quality of Life - 07/13/23 1057       Quality of Life   Select Quality of Life      Quality of Life Scores   Health/Function Pre 28.43 %    Socioeconomic Pre 30 %    Psych/Spiritual Pre 30 %    Family Pre 24 %    GLOBAL Pre 28.38 %            Scores of 19 and below usually indicate a poorer quality of life in these areas.  A difference of  2-3 points is a clinically meaningful difference.  A difference of 2-3 points in the total score of the Quality of Life Index has been associated with significant improvement in overall quality of life, self-image, physical symptoms, and general health  in studies assessing change in quality of life.  PHQ-9: Review Flowsheet       07/13/2023 10/02/2018  Depression screen PHQ 2/9  Decreased Interest 0 0  Down, Depressed, Hopeless 0 0  PHQ - 2 Score 0 0  Altered sleeping 0 -  Tired, decreased energy 0 -  Change in appetite 0 -  Feeling bad or failure about yourself  0 -  Trouble concentrating 0 -  Moving slowly or fidgety/restless 0 -  Suicidal thoughts 0 -  PHQ-9 Score 0 -    Details           Interpretation of Total Score  Total Score Depression Severity:  1-4 = Minimal depression, 5-9 = Mild depression, 10-14 = Moderate depression, 15-19 = Moderately severe depression, 20-27 = Severe depression   Psychosocial Evaluation and Intervention:  Psychosocial Evaluation - 07/08/23 0942       Psychosocial Evaluation & Interventions   Interventions Stress management education;Relaxation education;Encouraged to exercise with the program and follow exercise prescription    Comments Patient was referred to CR with CABGx3 from [facility]. He denies any depression or anxiety or stressors in his life. He does report trouble staying alseep due to frequent urination which has been a chronic problem for him for several years. He has tried sleepaides in the past but says he does not use anything currently. He lives alone. He has a son that lives in [place] whom he says plans to move near him soon. He is hopeful this will happen. He says his sister is his main support person. She lives in the same apartment complex and checks on him everyday. She also helps him with his medications. He did not know what medications he was taking. He said he would bring a list with him to his orientation visit. He completed the 8th grade and says he is able to read some but not a lot. He said his sister would help him complete the paperwork for the program but he has not recieved the paperwork yet. His goals for the program are to get better overall; improve his strength,  stamina and energy and get back to normal. He has no barriers identified to complete CR.    Expected Outcomes Short Term: start the program and attend consistently. Long Term: meet his personal goals.    Continue Psychosocial Services  Follow up required by staff  Psychosocial Re-Evaluation:  Psychosocial Re-Evaluation     Row Name 08/12/23 0804             Psychosocial Re-Evaluation   Current issues with Current Sleep Concerns       Comments Thanos is doing well in rehab.  He tries not to stress over things and just gives them to God.  He prays throughout the day to help.  He is still not sleeping well, but sleeps when he can and he feels like he is doing well.       Expected Outcomes Short: Exercise for mental boost and help with sleep Long: Conitnue to stay positive       Interventions Encouraged to attend Cardiac Rehabilitation for the exercise       Continue Psychosocial Services  Follow up required by staff                Psychosocial Discharge (Final Psychosocial Re-Evaluation):  Psychosocial Re-Evaluation - 08/12/23 0804       Psychosocial Re-Evaluation   Current issues with Current Sleep Concerns    Comments Horatio is doing well in rehab.  He tries not to stress over things and just gives them to God.  He prays throughout the day to help.  He is still not sleeping well, but sleeps when he can and he feels like he is doing well.    Expected Outcomes Short: Exercise for mental boost and help with sleep Long: Conitnue to stay positive    Interventions Encouraged to attend Cardiac Rehabilitation for the exercise    Continue Psychosocial Services  Follow up required by staff             Vocational Rehabilitation: Provide vocational rehab assistance to qualifying candidates.   Vocational Rehab Evaluation & Intervention:  Vocational Rehab - 07/08/23 0936       Initial Vocational Rehab Evaluation & Intervention   Assessment shows need for Vocational  Rehabilitation No      Vocational Rehab Re-Evaulation   Comments Patient is disabled and does not need vocational rehab.             Education: Education Goals: Education classes will be provided on a weekly basis, covering required topics. Participant will state understanding/return demonstration of topics presented.  Learning Barriers/Preferences:  Learning Barriers/Preferences - 07/08/23 0936       Learning Barriers/Preferences   Learning Barriers None    Learning Preferences Skilled Demonstration;Audio             Education Topics: Hypertension, Hypertension Reduction -Define heart disease and high blood pressure. Discus how high blood pressure affects the body and ways to reduce high blood pressure.   Exercise and Your Heart -Discuss why it is important to exercise, the FITT principles of exercise, normal and abnormal responses to exercise, and how to exercise safely. Flowsheet Row CARDIAC REHAB PHASE II EXERCISE from 08/19/2023 in [place] CARDIAC REHABILITATION  Date 07/20/23  Educator [facility]  Instruction Review Code 1- Verbalizes Understanding       Angina -Discuss definition of angina, causes of angina, treatment of angina, and how to decrease risk of having angina.   Cardiac Medications -Review what the following cardiac medications are used for, how they affect the body, and side effects that may occur when taking the medications.  Medications include Aspirin, Beta blockers, calcium channel blockers, ACE Inhibitors, angiotensin receptor blockers, diuretics, digoxin, and antihyperlipidemics.   Congestive Heart Failure -Discuss the definition of CHF, how to live with CHF,  the signs and symptoms of CHF, and how keep track of weight and sodium intake.   Heart Disease and Intimacy -Discus the effect sexual activity has on the heart, how changes occur during intimacy as we age, and safety during sexual activity. Flowsheet Row CARDIAC REHAB PHASE II EXERCISE  from 08/19/2023 in [place] CARDIAC REHABILITATION  Date 07/27/23  Educator [facility]  Instruction Review Code 1- Verbalizes Understanding       Smoking Cessation / COPD -Discuss different methods to quit smoking, the health benefits of quitting smoking, and the definition of COPD.   Nutrition I: Fats -Discuss the types of cholesterol, what cholesterol does to the heart, and how cholesterol levels can be controlled. Flowsheet Row CARDIAC REHAB PHASE II EXERCISE from 08/19/2023 in [place] CARDIAC REHABILITATION  Date 08/10/23  Educator [facility]  Instruction Review Code 1- Verbalizes Understanding       Nutrition II: Labels -Discuss the different components of food labels and how to read food label Flowsheet Row CARDIAC REHAB PHASE II EXERCISE from 08/19/2023 in [place] CARDIAC REHABILITATION  Date 08/10/23  Educator [facility]  Instruction Review Code 1- Verbalizes Understanding       Heart Parts/Heart Disease and PAD -Discuss the anatomy of the heart, the pathway of blood circulation through the heart, and these are affected by heart disease.   Stress I: Signs and Symptoms -Discuss the causes of stress, how stress may lead to anxiety and depression, and ways to limit stress.   Stress II: Relaxation -Discuss different types of relaxation techniques to limit stress.   Warning Signs of Stroke / TIA -Discuss definition of a stroke, what the signs and symptoms are of a stroke, and how to identify when someone is having stroke.   Knowledge Questionnaire Score:  Knowledge Questionnaire Score - 07/13/23 0855       Knowledge Questionnaire Score   Pre Score 15/24             Core Components/Risk Factors/Patient Goals at Admission:  Personal Goals and Risk Factors at Admission - 07/08/23 0938       Core Components/Risk Factors/Patient Goals on Admission    Weight Management Weight Maintenance    Improve shortness of breath with ADL's Yes    Intervention Provide education,  individualized exercise plan and daily activity instruction to help decrease symptoms of SOB with activities of daily living.    Expected Outcomes Short Term: Improve cardiorespiratory fitness to achieve a reduction of symptoms when performing ADLs;Long Term: Be able to perform more ADLs without symptoms or delay the onset of symptoms    Diabetes Yes    Intervention Provide education about signs/symptoms and action to take for hypo/hyperglycemia.;Provide education about proper nutrition, including hydration, and aerobic/resistive exercise prescription along with prescribed medications to achieve blood glucose in normal ranges: Fasting glucose 65-99 mg/dL    Expected Outcomes Short Term: Participant verbalizes understanding of the signs/symptoms and immediate care of hyper/hypoglycemia, proper foot care and importance of medication, aerobic/resistive exercise and nutrition plan for blood glucose control.;Long Term: Attainment of HbA1C < 7%.    Hypertension Yes    Intervention Monitor prescription use compliance.;Provide education on lifestyle modifcations including regular physical activity/exercise, weight management, moderate sodium restriction and increased consumption of fresh fruit, vegetables, and low fat dairy, alcohol moderation, and smoking cessation.    Expected Outcomes Long Term: Maintenance of blood pressure at goal levels.;Short Term: Continued assessment and intervention until BP is < 140/33mm HG in hypertensive participants. < 130/22mm HG in  hypertensive participants with diabetes, heart failure or chronic kidney disease.    Lipids Yes    Intervention Provide education and support for participant on nutrition & aerobic/resistive exercise along with prescribed medications to achieve LDL 70mg , HDL >40mg .    Expected Outcomes Short Term: Participant states understanding of desired cholesterol values and is compliant with medications prescribed. Participant is following exercise prescription  and nutrition guidelines.;Long Term: Cholesterol controlled with medications as prescribed, with individualized exercise RX and with personalized nutrition plan. Value goals: LDL < 70mg , HDL > 40 mg.             Core Components/Risk Factors/Patient Goals Review:   Goals and Risk Factor Review     Row Name 08/12/23 0808             Core Components/Risk Factors/Patient Goals Review   Personal Goals Review Weight Management/Obesity;Diabetes;Improve shortness of breath with ADL's;Hypertension;Lipids       Review Sultan is doing well.  His weight is holding steady.  His blood sugars are doing well and usually around 110-120 mg/dl in the mornings.  His pressure are doing well.  He had good readings at the doctor's office this week.  He does check them both at home daily.  He is doing better with his breathing and feels like he has been able to do more at home than before.  He is usually able to push a couple of days and then take a day of rest and then go again.       Expected Outcomes Short: Conitnue to keep eye on sugars and pressures Long: Conitnue to monitor risk factors.                Core Components/Risk Factors/Patient Goals at Discharge (Final Review):   Goals and Risk Factor Review - 08/12/23 0808       Core Components/Risk Factors/Patient Goals Review   Personal Goals Review Weight Management/Obesity;Diabetes;Improve shortness of breath with ADL's;Hypertension;Lipids    Review Miking is doing well.  His weight is holding steady.  His blood sugars are doing well and usually around 110-120 mg/dl in the mornings.  His pressure are doing well.  He had good readings at the doctor's office this week.  He does check them both at home daily.  He is doing better with his breathing and feels like he has been able to do more at home than before.  He is usually able to push a couple of days and then take a day of rest and then go again.    Expected Outcomes Short: Conitnue to keep eye on  sugars and pressures Long: Conitnue to monitor risk factors.             ITP Comments:  ITP Comments     Row Name 07/13/23 1059 07/15/23 0803 08/03/23 0644 08/31/23 0648     ITP Comments Patient arrived for 1st visit/orientation/education at 0800. 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 During orientation advised patient on arrival and appointment times what to wear, what to do before, during and after exercise. Reviewed attendance and class policy.  Pt is scheduled to return Cardiac Rehab on Friday 07/15/23 at 0800. Pt was advised to come to class 15 minutes before class starts.  Discussed RPE/Dpysnea scales. Patient participated in warm up stretches. Patient was able to complete 6 minute walk test.  Telemetry:NSR. Patient was measured for the equipment. Discussed equipment safety with patient. Took patient pre-anthropometric measurements. Patient  finished visit at 0900. First full day of exercise!  Patient was oriented to gym and equipment including functions, settings, policies, and procedures.  Patient's individual exercise prescription and treatment plan were reviewed.  All starting workloads were established based on the results of the 6 minute walk test done at initial orientation visit.  The plan for exercise progression was also introduced and progression will be customized based on patient's performance and goals. 30 day review completed. ITP sent to Dr. Dina Rich, Medical Director of Cardiac Rehab. Continue with ITP unless changes are made by physician. 30 day review completed. ITP sent to Dr. Dina Rich, Medical Director of Cardiac Rehab. Continue with ITP unless changes are made by physician.             Comments: 30 day review

## 2023-08-31 NOTE — Progress Notes (Signed)
Daily Session Note  Patient Details  Name: Cody Shepard MRN: [id number] Date of Birth: 02-26-1966 Referring Provider:   Flowsheet Row CARDIAC REHAB PHASE II ORIENTATION from 07/13/2023 in [facility] CARDIAC REH[facility]IT[facility]  Referring Provider Dr. Mayford Knife       Encounter Date: 08/31/2023  Check In:  Session Check In - 08/31/23 0745       Check-In   Supervising physician immediately available to respond to emergencies See telemetry face sheet for immediately available MD    Location [facility]    Staff Present Ross Ludwig, BS, Exercise Physiologist;Other    Virtual Visit No    Medication changes reported     No    Fall or balance concerns reported    No    Tobacco Cessation No Change    Warm-up and Cool-down Performed on first and last piece of equipment    Resistance Training Performed Yes    VAD Patient? No    PAD/SET Patient? No      Pain Assessment   Currently in Pain? No/denies    Multiple Pain Sites No             Capillary Blood Glucose: No results found for this or any previous visit (from the past 24 hour(s)).    Social History   Tobacco Use  Smoking Status Former   Current packs/day: 0.00   Types: Cigarettes   Quit date: 08/30/1997   Years since quitting: 26.0  Smokeless Tobacco Never    Goals Met:  Independence with exercise equipment Exercise tolerated well No report of concerns or symptoms today Strength training completed today  Goals Unmet:  Not Applicable  Comments: Pt able to follow exercise prescription today without complaint.  Will continue to monitor for progression.

## 2023-09-02 ENCOUNTER — Encounter (HOSPITAL_COMMUNITY): Payer: Medicare Other

## 2023-09-05 ENCOUNTER — Encounter (HOSPITAL_COMMUNITY): Payer: Medicare Other

## 2023-09-07 ENCOUNTER — Encounter (HOSPITAL_COMMUNITY): Payer: Medicare Other

## 2023-09-09 ENCOUNTER — Encounter (HOSPITAL_COMMUNITY): Payer: Medicare Other

## 2023-09-10 ENCOUNTER — Emergency Department (HOSPITAL_COMMUNITY): Payer: Medicare Other

## 2023-09-10 ENCOUNTER — Other Ambulatory Visit: Payer: Self-pay

## 2023-09-10 ENCOUNTER — Emergency Department (HOSPITAL_COMMUNITY)
Admission: EM | Admit: 2023-09-10 | Discharge: 2023-09-10 | Disposition: A | Discharge status: Home / Self Care | Payer: Medicare Other | Attending: Emergency Medicine | Admitting: Emergency Medicine

## 2023-09-10 DIAGNOSIS — Z1152 Encounter for screening for COVID-19: Secondary | ICD-10-CM | POA: Insufficient documentation

## 2023-09-10 DIAGNOSIS — Z7901 Long term (current) use of anticoagulants: Secondary | ICD-10-CM | POA: Insufficient documentation

## 2023-09-10 DIAGNOSIS — M436 Torticollis: Secondary | ICD-10-CM | POA: Insufficient documentation

## 2023-09-10 DIAGNOSIS — M542 Cervicalgia: Secondary | ICD-10-CM | POA: Diagnosis present

## 2023-09-10 LAB — SARS CORONAVIRUS 2 BY RT PCR: SARS Coronavirus 2 by RT PCR: NEGATIVE

## 2023-09-10 MED ORDER — DICLOFENAC SODIUM 1 % EX GEL: 2.0000 g | Freq: Three times a day (TID) | CUTANEOUS | 0 refills | Status: AC

## 2023-09-10 MED ORDER — KETOROLAC TROMETHAMINE 15 MG/ML IJ SOLN
15.0000 mg | Freq: Once | INTRAMUSCULAR | Status: AC
  Administered 2023-09-10: 15 mg via INTRAMUSCULAR
  Filled 2023-09-10: qty 1

## 2023-09-10 MED ORDER — TIZANIDINE HCL 4 MG PO TABS: 4.0000 mg | ORAL_TABLET | Freq: Three times a day (TID) | ORAL | 0 refills | Status: AC

## 2023-09-10 MED ORDER — DICLOFENAC EPOLAMINE 1.3 % EX PTCH
1.0000 | MEDICATED_PATCH | Freq: Two times a day (BID) | CUTANEOUS | Status: DC
  Administered 2023-09-10: 1 via TRANSDERMAL
  Filled 2023-09-10: qty 1

## 2023-09-10 MED ORDER — TIZANIDINE HCL 4 MG PO TABS
2.0000 mg | ORAL_TABLET | Freq: Once | ORAL | Status: AC
  Administered 2023-09-10: 2 mg via ORAL
  Filled 2023-09-10: qty 1

## 2023-09-10 NOTE — ED Provider Notes (Signed)
[place] EMERGENCY DEPARTMENT AT [facility] Provider Note   CSN: [id number] Arrival date & time: 09/10/23  1109     History  Chief Complaint  Patient presents with   Cough   Torticollis    Cody Shepard is a 57 y.o. male.  HPI Patient with multiple medical problems, presents with neck pain.  Patient notes ongoing cough, congestion, but this is unchanged.  However, today the patient awoke with soreness in his posterior neck, difficulty with neck extension and now presents for evaluation.  No weakness in his extremities, no fever, no vomiting, no recent medication change, no medication attempted for relief of his neck soreness.    Home Medications Prior to Admission medications   Medication Sig Start Date End Date Taking? Authorizing Provider  diclofenac Sodium (VOLTAREN) 1 % GEL Apply 2 g topically in the morning, at noon, and at bedtime. Apply to back of your neck. 09/10/23  Yes Gerhard Munch, MD  tiZANidine (ZANAFLEX) 4 MG tablet Take 1 tablet (4 mg total) by mouth 3 (three) times daily for 3 days. 09/10/23 09/13/23 Yes Gerhard Munch, MD  albuterol (PROVENTIL HFA;VENTOLIN HFA) 108 (90 BASE) MCG/ACT inhaler Inhale 2 puffs into the lungs every 4 (four) hours as needed for wheezing or shortness of breath.  Patient not taking: Reported on 07/15/2023    [provider]  alfuzosin (UROXATRAL) 10 MG 24 hr tablet Take 1 tablet (10 mg total) by mouth at bedtime. Patient not taking: Reported on 07/15/2023 02/15/23   Malen Gauze, MD  AMBULATORY NON FORMULARY MEDICATION 0.2 mLs by Intracavernosal route as needed. Medication Name: Trimix  PGE Pap 30mg  Phent 1mg  Patient not taking: Reported on 07/15/2023 02/15/23   Malen Gauze, MD  amLODipine (NORVASC) 2.5 MG tablet Take 2.5 mg by mouth daily. 05/11/23 05/10/24  [provider]  atorvastatin (LIPITOR) 80 MG tablet Take 80 mg by mouth daily. 05/10/23   [provider]  carvedilol  (COREG) 25 MG tablet Take 25 mg by mouth 2 (two) times daily with a meal. Breakfast & supper Patient not taking: Reported on 07/15/2023    [provider]  cloNIDine (CATAPRES - DOSED IN MG/24 HR) 0.1 mg/24hr patch Place 0.1 mg onto the skin every Sunday. Patient not taking: Reported on 07/15/2023    [provider]  diclofenac Sodium (VOLTAREN) 1 % GEL Apply 1 g topically 4 (four) times daily as needed (pain.). Patient not taking: Reported on 07/15/2023    [provider]  ELIQUIS 5 MG TABS tablet Take 5 mg by mouth every 12 (twelve) hours. 05/09/23   [provider]  fluticasone (FLONASE) 50 MCG/ACT nasal spray Place 2 sprays into both nostrils daily.  Patient not taking: Reported on 07/15/2023    [provider]  Fluticasone-Umeclidin-Vilant (TRELEGY ELLIPTA) 100-62.5-25 MCG/INH AEPB Inhale 1 Inhaler into the lungs daily. Patient not taking: Reported on 07/15/2023    [provider]  hydrALAZINE (APRESOLINE) 50 MG tablet Take 50 mg by mouth in the morning and at bedtime. Patient not taking: Reported on 07/15/2023 12/10/19   [provider]  HYDROcodone-acetaminophen (NORCO) 5-325 MG tablet Take 1 tablet by mouth every 4 (four) hours as needed for moderate pain. 01/16/20   Franky Macho, MD  isosorbide mononitrate (IMDUR) 60 MG 24 hr tablet Take 60 mg by mouth in the morning and at bedtime.  Patient not taking: Reported on 07/15/2023 11/06/19   [provider]  metFORMIN (GLUCOPHAGE) 500 MG tablet Take 500  mg by mouth 2 (two) times daily. Patient not taking: Reported on 07/15/2023 01/07/20   [provider]  metoprolol tartrate (LOPRESSOR) 25 MG tablet Take 25 mg by mouth 2 (two) times daily. 05/10/23   [provider]  nitroGLYCERIN (NITROSTAT) 0.4 MG SL tablet Place 0.4 mg under the tongue every 5 (five) minutes x 3 doses as needed for chest pain.    [provider]  olmesartan (BENICAR) 20 MG tablet Take 20  mg by mouth daily. Patient not taking: Reported on 07/15/2023 02/26/19   [provider]  ranolazine (RANEXA) 500 MG 12 hr tablet Take 500 mg by mouth 2 (two) times daily. Patient not taking: Reported on 07/15/2023    [provider]  tamsulosin (FLOMAX) 0.4 MG CAPS capsule Take 0.4 mg by mouth daily. Patient not taking: Reported on 07/15/2023 05/10/23   [provider]  vitamin C (ASCORBIC ACID) 250 MG tablet Take 250 mg by mouth daily. Patient not taking: Reported on 07/15/2023    [provider]      Allergies    Patient has no known allergies.    Review of Systems   Review of Systems  Physical Exam Updated Vital Signs BP 129/86   Pulse (!) 125   Temp 98 F (36.7 C) (Oral)   Resp 20   Ht 5\' 8"  (1.727 m)   Wt 78.5 kg   SpO2 96%   BMI 26.30 kg/m  Physical Exam Vitals and nursing note reviewed.  Constitutional:      General: He is not in acute distress.    Appearance: He is well-developed.  HENT:     Head: Normocephalic and atraumatic.  Eyes:     Conjunctiva/sclera: Conjunctivae normal.  Neck:   Cardiovascular:     Rate and Rhythm: Normal rate and regular rhythm.  Pulmonary:     Effort: Pulmonary effort is normal. No respiratory distress.     Breath sounds: No stridor.  Abdominal:     General: There is no distension.  Skin:    General: Skin is warm and dry.  Neurological:     Mental Status: He is alert and oriented to person, place, and time.     Cranial Nerves: No cranial nerve deficit.     Motor: Atrophy present. No weakness, tremor or abnormal muscle tone.     Coordination: Coordination normal.     ED Results / Procedures / Treatments   Labs (all labs ordered are listed, but only abnormal results are displayed) Labs Reviewed  SARS CORONAVIRUS 2 BY RT PCR    EKG None  Radiology DG Chest 2 View  Result Date: 09/10/2023 CLINICAL DATA:  Productive cough and chest congestion for 2 days. Coronary artery disease. EXAM:  CHEST - 2 VIEW COMPARISON:  10/25/2022 FINDINGS: The heart size and mediastinal contours are within normal limits. Prior CABG. Both lungs are clear. The visualized skeletal structures are unremarkable. IMPRESSION: No active cardiopulmonary disease. Electronically Signed   By: Danae Orleans M.D.   On: 09/10/2023 13:08   DG Cervical Spine 2-3 Views  Result Date: 09/10/2023 CLINICAL DATA:  Neck pain EXAM: CERVICAL SPINE - 2-3 VIEW COMPARISON:  None Available. FINDINGS: There is no evidence of cervical spine fracture or prevertebral soft tissue swelling. Straightening of the cervical lordosis. Intervertebral disc height loss and endplate spurring at the C2-3 level. Minimal disc space narrowing at C4-5. No appreciable facet arthropathy. IMPRESSION: Moderate degenerative disc disease at C2-3.  No acute findings. Electronically Signed  By: Duanne Guess D.O.   On: 09/10/2023 12:44    Procedures Procedures    Medications Ordered in ED Medications  diclofenac (FLECTOR) 1.3 % 1 patch (1 patch Transdermal Patch Applied 09/10/23 1245)  tiZANidine (ZANAFLEX) tablet 2 mg (2 mg Oral Given by Other 09/10/23 1245)  ketorolac (TORADOL) 15 MG/ML injection 15 mg (15 mg Intramuscular Given by Other 09/10/23 1245)    ED Course/ Medical Decision Making/ A&P                                 Medical Decision Making Elderly male with multiple medical problems presents with neck pain.  Given his history of coughing, some suspicion for torticollis.  Reassuring neurologic exam, absence of distress, patient's denial of other concern reassuring for low suspicion of CNS pathology.  Patient received diclofenac, muscle relaxer, x-ray.  Amount and/or Complexity of Data Reviewed External Data Reviewed: notes.    Details: Ongoing cardiac rehab efforts Radiology: ordered and independent interpretation performed. Decision-making details documented in ED Course.  Risk Prescription drug management. Decision regarding  hospitalization. Diagnosis or treatment significantly limited by social determinants of health.   1:47 PM Patient resting, in no distress, states that he feels better.  He does have mild cough, but x-ray does not have pneumonia, COVID test is negative, and the patient notes chronic cough.  No evidence for bacteremia, sepsis. Very reassuring, concern for torticollis given his description of onset after coughing, difficulty with neck motion absence of neurodeficits.  Patient comfortable with, appropriate for discharge.        Final Clinical Impression(s) / ED Diagnoses Final diagnoses:  Torticollis    Rx / DC Orders ED Discharge Orders          Ordered    tiZANidine (ZANAFLEX) 4 MG tablet  3 times daily        09/10/23 1347    diclofenac Sodium (VOLTAREN) 1 % GEL  3 times daily        09/10/23 1347              Gerhard Munch, MD 09/10/23 1347

## 2023-09-10 NOTE — ED Notes (Signed)
Pt gone to Xray 

## 2023-09-10 NOTE — ED Triage Notes (Signed)
Pt arrived [facility] from home with c/o cough/congestion with productive cough x 2 days. Pt woke up with  a stiff neck. Pt is able to move his head left to right but not backwards.

## 2023-09-10 NOTE — Discharge Instructions (Signed)
As discussed, your evaluation today has been largely reassuring.  But, it is important that you monitor your condition carefully, and do not hesitate to return to the ED if you develop new, or concerning changes in your condition. ? ?Otherwise, please follow-up with your physician for appropriate ongoing care. ? ?

## 2023-09-12 ENCOUNTER — Telehealth (HOSPITAL_COMMUNITY): Payer: Self-pay | Admitting: *Deleted

## 2023-09-12 ENCOUNTER — Encounter (HOSPITAL_COMMUNITY): Payer: Medicare Other

## 2023-09-12 ENCOUNTER — Encounter (HOSPITAL_COMMUNITY): Payer: Self-pay | Admitting: *Deleted

## 2023-09-12 DIAGNOSIS — Z951 Presence of aortocoronary bypass graft: Secondary | ICD-10-CM

## 2023-09-12 NOTE — Telephone Encounter (Signed)
Cody Shepard called out today again.  His back was bothering him and he has an appointment tomorrow for his neck.  He will be out for the week and hopes to return next week.

## 2023-09-14 ENCOUNTER — Encounter (HOSPITAL_COMMUNITY): Payer: Medicare Other

## 2023-09-16 ENCOUNTER — Encounter (HOSPITAL_COMMUNITY): Payer: Medicare Other

## 2023-09-19 ENCOUNTER — Encounter (HOSPITAL_COMMUNITY): Payer: Medicare Other

## 2023-09-21 ENCOUNTER — Ambulatory Visit: Payer: Medicare Other | Admitting: Urology

## 2023-09-21 ENCOUNTER — Encounter (HOSPITAL_COMMUNITY): Payer: Medicare Other

## 2023-09-26 ENCOUNTER — Encounter (HOSPITAL_COMMUNITY): Payer: Medicare Other

## 2023-09-27 ENCOUNTER — Encounter (HOSPITAL_COMMUNITY): Payer: Self-pay | Admitting: *Deleted

## 2023-09-27 DIAGNOSIS — Z951 Presence of aortocoronary bypass graft: Secondary | ICD-10-CM

## 2023-09-27 NOTE — Progress Notes (Signed)
Cardiac Individual Treatment Plan  Patient Details  Name: Cody Shepard MRN: [id number] Date of Birth: 1966-09-13 Referring Provider:   Flowsheet Row CARDIAC REHAB PHASE II ORIENTATION from 07/13/2023 in [facility] CARDIAC REHABILITATION  Referring Provider Dr. Mayford Knife       Initial Encounter Date:  Flowsheet Row CARDIAC REHAB PHASE II ORIENTATION from 07/13/2023 in [place] CARDIAC REHABILITATION  Date 07/13/23       Visit Diagnosis: S/P CABG x 3  Patient's Home Medications on Admission:  Current Outpatient Medications:    albuterol (PROVENTIL HFA;VENTOLIN HFA) 108 (90 BASE) MCG/ACT inhaler, Inhale 2 puffs into the lungs every 4 (four) hours as needed for wheezing or shortness of breath.  (Patient not taking: Reported on 07/15/2023), Disp: , Rfl:    alfuzosin (UROXATRAL) 10 MG 24 hr tablet, Take 1 tablet (10 mg total) by mouth at bedtime. (Patient not taking: Reported on 07/15/2023), Disp: 30 tablet, Rfl: 11   AMBULATORY NON FORMULARY MEDICATION, 0.2 mLs by Intracavernosal route as needed. Medication Name: Trimix  PGE Pap 30mg  Phent 1mg  (Patient not taking: Reported on 07/15/2023), Disp: 5 mL, Rfl: 5   amLODipine (NORVASC) 2.5 MG tablet, Take 2.5 mg by mouth daily., Disp: , Rfl:    atorvastatin (LIPITOR) 80 MG tablet, Take 80 mg by mouth daily., Disp: , Rfl:    carvedilol (COREG) 25 MG tablet, Take 25 mg by mouth 2 (two) times daily with a meal. Breakfast & supper (Patient not taking: Reported on 07/15/2023), Disp: , Rfl:    cloNIDine (CATAPRES - DOSED IN MG/24 HR) 0.1 mg/24hr patch, Place 0.1 mg onto the skin every Sunday. (Patient not taking: Reported on 07/15/2023), Disp: , Rfl:    diclofenac Sodium (VOLTAREN) 1 % GEL, Apply 1 g topically 4 (four) times daily as needed (pain.). (Patient not taking: Reported on 07/15/2023), Disp: , Rfl:    diclofenac Sodium (VOLTAREN) 1 % GEL, Apply 2 g topically in the morning, at noon, and at bedtime. Apply to back of your neck., Disp: 100 g, Rfl:  0   ELIQUIS 5 MG TABS tablet, Take 5 mg by mouth every 12 (twelve) hours., Disp: , Rfl:    fluticasone (FLONASE) 50 MCG/ACT nasal spray, Place 2 sprays into both nostrils daily.  (Patient not taking: Reported on 07/15/2023), Disp: , Rfl:    Fluticasone-Umeclidin-Vilant (TRELEGY ELLIPTA) 100-62.5-25 MCG/INH AEPB, Inhale 1 Inhaler into the lungs daily. (Patient not taking: Reported on 07/15/2023), Disp: , Rfl:    hydrALAZINE (APRESOLINE) 50 MG tablet, Take 50 mg by mouth in the morning and at bedtime. (Patient not taking: Reported on 07/15/2023), Disp: , Rfl:    HYDROcodone-acetaminophen (NORCO) 5-325 MG tablet, Take 1 tablet by mouth every 4 (four) hours as needed for moderate pain., Disp: 40 tablet, Rfl: 0   isosorbide mononitrate (IMDUR) 60 MG 24 hr tablet, Take 60 mg by mouth in the morning and at bedtime.  (Patient not taking: Reported on 07/15/2023), Disp: , Rfl:    metFORMIN (GLUCOPHAGE) 500 MG tablet, Take 500 mg by mouth 2 (two) times daily. (Patient not taking: Reported on 07/15/2023), Disp: , Rfl:    metoprolol tartrate (LOPRESSOR) 25 MG tablet, Take 25 mg by mouth 2 (two) times daily., Disp: , Rfl:    nitroGLYCERIN (NITROSTAT) 0.4 MG SL tablet, Place 0.4 mg under the tongue every 5 (five) minutes x 3 doses as needed for chest pain., Disp: , Rfl:    olmesartan (BENICAR) 20 MG tablet, Take 20 mg by mouth daily. (Patient not  taking: Reported on 07/15/2023), Disp: , Rfl:    ranolazine (RANEXA) 500 MG 12 hr tablet, Take 500 mg by mouth 2 (two) times daily. (Patient not taking: Reported on 07/15/2023), Disp: , Rfl:    tamsulosin (FLOMAX) 0.4 MG CAPS capsule, Take 0.4 mg by mouth daily. (Patient not taking: Reported on 07/15/2023), Disp: , Rfl:    vitamin C (ASCORBIC ACID) 250 MG tablet, Take 250 mg by mouth daily. (Patient not taking: Reported on 07/15/2023), Disp: , Rfl:   Past Medical History: Past Medical History:  Diagnosis Date   Asthma    Bronchitis    COPD (chronic obstructive pulmonary  disease) (HCC)    Coronary artery disease    Diabetes mellitus without complication (HCC)    Headache    High cholesterol    Hypertension     Tobacco Use: Social History   Tobacco Use  Smoking Status Former   Current packs/day: 0.00   Types: Cigarettes   Quit date: 08/30/1997   Years since quitting: 26.0  Smokeless Tobacco Never    Labs: Review Flowsheet       Latest Ref Rng & Units 08/10/2018 01/03/2019 03/10/2019 01/14/2020  Labs for ITP Cardiac and Pulmonary Rehab  Cholestrol 0 - 200 mg/dL - - 161  -  LDL (calc) 0 - 99 mg/dL - - 096  -  HDL-C >04 mg/dL - - 35  -  Trlycerides <150 mg/dL - - 540  -  Hemoglobin A1c 4.8 - 5.6 % 6.2  5.5  6.3  6.3     Details            Capillary Blood Glucose: Lab Results  Component Value Date   GLUCAP 92 07/20/2023   GLUCAP 128 (H) 07/20/2023   GLUCAP 230 (H) 07/18/2023   GLUCAP 145 (H) 07/15/2023   GLUCAP 225 (H) 07/15/2023     Exercise Target Goals: Exercise Program Goal: Individual exercise prescription set using results from initial 6 min walk test and THRR while considering  patient's activity barriers and safety.   Exercise Prescription Goal: Starting with aerobic activity 30 plus minutes a day, 3 days per week for initial exercise prescription. Provide home exercise prescription and guidelines that participant acknowledges understanding prior to discharge.  Activity Barriers & Risk Stratification:  Activity Barriers & Cardiac Risk Stratification - 07/08/23 0916       Activity Barriers & Cardiac Risk Stratification   Activity Barriers Shortness of Breath;Incisional Pain;Chest Pain/Angina;Assistive Device    Cardiac Risk Stratification High             6 Minute Walk:  6 Minute Walk     Row Name 07/13/23 0913         6 Minute Walk   Phase Initial     Distance 1330 feet     Walk Time 6 minutes     # of Rest Breaks 0     MPH 2.51     METS 3.32     RPE 11     VO2 Peak 11.64     Symptoms No      Resting HR 83 bpm     Resting BP 126/84     Resting Oxygen Saturation  99 %     Exercise Oxygen Saturation  during 6 min walk 98 %     Max Ex. HR 95 bpm     Max Ex. BP 136/86     2 Minute Post BP 124/80  Oxygen Initial Assessment:   Oxygen Re-Evaluation:   Oxygen Discharge (Final Oxygen Re-Evaluation):   Initial Exercise Prescription:  Initial Exercise Prescription - 07/13/23 0900       Date of Initial Exercise RX and Referring Provider   Date 07/13/23    Referring Provider Dr. Mayford Knife      Oxygen   Maintain Oxygen Saturation 88% or higher      Treadmill   MPH 2    Grade 0    Minutes 15      Elliptical   Level 2    Speed 50    Minutes 15      Prescription Details   Frequency (times per week) 3    Duration Progress to 30 minutes of continuous aerobic without signs/symptoms of physical distress      Intensity   THRR 40-80% of Max Heartrate 114-145    Ratings of Perceived Exertion 11-13    Perceived Dyspnea 0-4      Resistance Training   Training Prescription Yes    Weight 4    Reps 10-15             Perform Capillary Blood Glucose checks as needed.  Exercise Prescription Changes:   Exercise Prescription Changes     Row Name 07/18/23 1300 08/12/23 0800 08/15/23 1200 08/29/23 1200       Response to Exercise   Blood Pressure (Admit) 120/64 -- 98/60 106/60    Blood Pressure (Exercise) 126/74 -- -- --    Blood Pressure (Exit) 110/60 -- 96/66 90/60    Heart Rate (Admit) 101 bpm -- 96 bpm 78 bpm    Heart Rate (Exercise) 130 bpm -- 135 bpm 146 bpm    Heart Rate (Exit) 107 bpm -- 101 bpm 107 bpm    Rating of Perceived Exertion (Exercise) 10 -- 12 12    Duration Continue with 30 min of aerobic exercise without signs/symptoms of physical distress. -- Continue with 30 min of aerobic exercise without signs/symptoms of physical distress. Continue with 30 min of aerobic exercise without signs/symptoms of physical distress.    Intensity  THRR unchanged -- THRR unchanged THRR unchanged      Progression   Progression Continue to progress workloads to maintain intensity without signs/symptoms of physical distress. -- Continue to progress workloads to maintain intensity without signs/symptoms of physical distress. Continue to progress workloads to maintain intensity without signs/symptoms of physical distress.      Resistance Training   Training Prescription Yes -- Yes Yes    Weight 5 -- 5 lbs 5lbs    Reps 10-15 -- 10-15 10-15    Time 1 Minutes -- -- --      Treadmill   MPH 1.8 -- 2.5 --    Grade 0 -- 1.5 --    Minutes 15 -- 15 --    METs 2.38 -- 3.43 --      NuStep   Level 2 -- -- --    SPM 101 -- -- --    Minutes 15 -- -- --    METs 2.8 -- -- --      Elliptical   Level 2 -- 4 4    Speed 49 -- 50 44    Minutes 15 -- 15 30    METs 2.5 -- 4 2.9      Home Exercise Plan   Plans to continue exercise at -- Home (comment)  walking Home (comment) Home (comment)    Frequency -- Add 2 additional days  to program exercise sessions. Add 2 additional days to program exercise sessions. Add 2 additional days to program exercise sessions.    Initial Home Exercises Provided -- 08/12/23 -- --      Oxygen   Maintain Oxygen Saturation 88% or higher -- 88% or higher 88% or higher             Exercise Comments:   Exercise Goals and Review:   Exercise Goals     Row Name 07/13/23 0916             Exercise Goals   Increase Physical Activity Yes       Intervention Provide advice, education, support and counseling about physical activity/exercise needs.;Develop an individualized exercise prescription for aerobic and resistive training based on initial evaluation findings, risk stratification, comorbidities and participant's personal goals.       Expected Outcomes Short Term: Attend rehab on a regular basis to increase amount of physical activity.;Long Term: Add in home exercise to make exercise part of routine and to  increase amount of physical activity.;Long Term: Exercising regularly at least 3-5 days a week.       Increase Strength and Stamina Yes       Intervention Provide advice, education, support and counseling about physical activity/exercise needs.;Develop an individualized exercise prescription for aerobic and resistive training based on initial evaluation findings, risk stratification, comorbidities and participant's personal goals.       Expected Outcomes Short Term: Increase workloads from initial exercise prescription for resistance, speed, and METs.;Short Term: Perform resistance training exercises routinely during rehab and add in resistance training at home;Long Term: Improve cardiorespiratory fitness, muscular endurance and strength as measured by increased METs and functional capacity ( )       Able to understand and use rate of perceived exertion (RPE) scale Yes       Intervention Provide education and explanation on how to use RPE scale       Expected Outcomes Short Term: Able to use RPE daily in rehab to express subjective intensity level;Long Term:  Able to use RPE to guide intensity level when exercising independently       Knowledge and understanding of Target Heart Rate Range (THRR) Yes       Intervention Provide education and explanation of THRR including how the numbers were predicted and where they are located for reference       Expected Outcomes Short Term: Able to state/look up THRR;Long Term: Able to use THRR to govern intensity when exercising independently;Short Term: Able to use daily as guideline for intensity in rehab       Able to check pulse independently Yes       Intervention Provide education and demonstration on how to check pulse in carotid and radial arteries.;Review the importance of being able to check your own pulse for safety during independent exercise       Expected Outcomes Short Term: Able to explain why pulse checking is important during independent  exercise;Long Term: Able to check pulse independently and accurately       Understanding of Exercise Prescription Yes       Intervention Provide education, explanation, and written materials on patient's individual exercise prescription       Expected Outcomes Short Term: Able to explain program exercise prescription;Long Term: Able to explain home exercise prescription to exercise independently                Exercise Goals Re-Evaluation :  Exercise Goals Re-Evaluation  Row Name 07/18/23 1313 08/12/23 0803 08/30/23 0731         Exercise Goal Re-Evaluation   Exercise Goals Review Increase Physical Activity;Able to understand and use Dyspnea scale;Understanding of Exercise Prescription Increase Physical Activity;Increase Strength and Stamina;Able to understand and use rate of perceived exertion (RPE) scale;Able to understand and use Dyspnea scale;Knowledge and understanding of Target Heart Rate Range (THRR);Able to check pulse independently;Understanding of Exercise Prescription Increase Physical Activity;Able to understand and use rate of perceived exertion (RPE) scale;Understanding of Exercise Prescription     Comments Cody Shepard has just started rehab and has been to two sessions. He has been doing well, walking at 1.8 speed on the treadmill and level 2 on the REX-XR. Will continue to monitor and progress as able. Cody Shepard is doing well in rehab.  He is already walking some at home and starting to get his stamina back.  Reviewed home exercise with pt today.  Pt plans to walk at home for exercise.  Reviewed THR, pulse, RPE, sign and symptoms, pulse oximetery and when to call 911 or MD.  Also discussed weather considerations and indoor options.  Pt voiced understanding. Cody Shepard is doing well in rehab. He has not increased on his levels for the treadmill or XR (level 4). He has been coming on and off to class and sometimes is late. HE stated that he has felt tired recently and runs out of energy around the  middle of the day. He is currently exercising at 2.9 METs. Will continue to monitor and progress as able     Expected Outcomes Short term: increase walking speed to 2.0   long term:continue to attend rehab Short: Continue to add in walking at home Long: Continue to exercise independently Short: Continue to add in walking at home Long: Continue to attend rehab and exercise independently               Discharge Exercise Prescription (Final Exercise Prescription Changes):  Exercise Prescription Changes - 08/29/23 1200       Response to Exercise   Blood Pressure (Admit) 106/60    Blood Pressure (Exit) 90/60    Heart Rate (Admit) 78 bpm    Heart Rate (Exercise) 146 bpm    Heart Rate (Exit) 107 bpm    Rating of Perceived Exertion (Exercise) 12    Duration Continue with 30 min of aerobic exercise without signs/symptoms of physical distress.    Intensity THRR unchanged      Progression   Progression Continue to progress workloads to maintain intensity without signs/symptoms of physical distress.      Resistance Training   Training Prescription Yes    Weight 5lbs    Reps 10-15      Elliptical   Level 4    Speed 44    Minutes 30    METs 2.9      Home Exercise Plan   Plans to continue exercise at Home (comment)    Frequency Add 2 additional days to program exercise sessions.      Oxygen   Maintain Oxygen Saturation 88% or higher             Nutrition:  Target Goals: Understanding of nutrition guidelines, daily intake of sodium 1500mg , cholesterol 200mg , calories 30% from fat and 7% or less from saturated fats, daily to have 5 or more servings of fruits and vegetables.  Biometrics:  Pre Biometrics - 07/13/23 0917       Pre Biometrics   Height 5\' 6"  (  1.676 m)    Weight 191 lb 12.8 oz (87 kg)    Waist Circumference 42 inches    Hip Circumference 41 inches    Waist to Hip Ratio 1.02 %    BMI (Calculated) 30.97    Grip Strength 28.9 kg    Single Leg Stand 31.35  seconds              Nutrition Therapy Plan and Nutrition Goals:   Nutrition Assessments:  MEDIFICTS Score Key: >=70 Need to make dietary changes  40-70 Heart Healthy Diet <= 40 Therapeutic Level Cholesterol Diet  Flowsheet Row CARDIAC REHAB PHASE II EXERCISE from 07/15/2023 in [facility] CARDIAC REHABILITATION  Picture Your Plate Total Score on Admission 40      Picture Your Plate Scores: <16 Unhealthy dietary pattern with much room for improvement. 41-50 Dietary pattern unlikely to meet recommendations for good health and room for improvement. 51-60 More healthful dietary pattern, with some room for improvement.  >60 Healthy dietary pattern, although there may be some specific behaviors that could be improved.    Nutrition Goals Re-Evaluation:  Nutrition Goals Re-Evaluation     Row Name 08/12/23 0806             Goals   Nutrition Goal Heart Healthy Diet       Comment Yazan attended nutrition education on Wednesday.  He feels like he has a good handle on his diet.  He is trying to get a good variety of fruits and vegetables in.  He also tries to get enough protein.  He is doing well staying away from salt and no longer likes the taste of salt.       Expected Outcome Short: Continue to add in fruits and vegetables Long: Continue to follow heart healthy diet                Nutrition Goals Discharge (Final Nutrition Goals Re-Evaluation):  Nutrition Goals Re-Evaluation - 08/12/23 0806       Goals   Nutrition Goal Heart Healthy Diet    Comment Cody Shepard attended nutrition education on Wednesday.  He feels like he has a good handle on his diet.  He is trying to get a good variety of fruits and vegetables in.  He also tries to get enough protein.  He is doing well staying away from salt and no longer likes the taste of salt.    Expected Outcome Short: Continue to add in fruits and vegetables Long: Continue to follow heart healthy diet              Psychosocial: Target Goals: Acknowledge presence or absence of significant depression and/or stress, maximize coping skills, provide positive support system. Participant is able to verbalize types and ability to use techniques and skills needed for reducing stress and depression.  Initial Review & Psychosocial Screening:  Initial Psych Review & Screening - 07/08/23 0942       Initial Review   Current issues with Current Sleep Concerns      Family Dynamics   Good Support System? Yes      Barriers   Psychosocial barriers to participate in program The patient should benefit from training in stress management and relaxation.      Screening Interventions   Interventions Encouraged to exercise;To provide support and resources with identified psychosocial needs;Provide feedback about the scores to participant    Expected Outcomes Short Term goal: Utilizing psychosocial counselor, staff and physician to assist with identification of specific Stressors  or current issues interfering with healing process. Setting desired goal for each stressor or current issue identified.;Long Term Goal: Stressors or current issues are controlled or eliminated.;Short Term goal: Identification and review with participant of any Quality of Life or Depression concerns found by scoring the questionnaire.;Long Term goal: The participant improves quality of Life and PHQ9 Scores as seen by post scores and/or verbalization of changes             Quality of Life Scores:  Quality of Life - 07/13/23 1057       Quality of Life   Select Quality of Life      Quality of Life Scores   Health/Function Pre 28.43 %    Socioeconomic Pre 30 %    Psych/Spiritual Pre 30 %    Family Pre 24 %    GLOBAL Pre 28.38 %            Scores of 19 and below usually indicate a poorer quality of life in these areas.  A difference of  2-3 points is a clinically meaningful difference.  A difference of 2-3 points in the total score  of the Quality of Life Index has been associated with significant improvement in overall quality of life, self-image, physical symptoms, and general health in studies assessing change in quality of life.  PHQ-9: Review Flowsheet       07/13/2023 10/02/2018  Depression screen PHQ 2/9  Decreased Interest 0 0  Down, Depressed, Hopeless 0 0  PHQ - 2 Score 0 0  Altered sleeping 0 -  Tired, decreased energy 0 -  Change in appetite 0 -  Feeling bad or failure about yourself  0 -  Trouble concentrating 0 -  Moving slowly or fidgety/restless 0 -  Suicidal thoughts 0 -  PHQ-9 Score 0 -    Details           Interpretation of Total Score  Total Score Depression Severity:  1-4 = Minimal depression, 5-9 = Mild depression, 10-14 = Moderate depression, 15-19 = Moderately severe depression, 20-27 = Severe depression   Psychosocial Evaluation and Intervention:  Psychosocial Evaluation - 07/08/23 0942       Psychosocial Evaluation & Interventions   Interventions Stress management education;Relaxation education;Encouraged to exercise with the program and follow exercise prescription    Comments Patient was referred to CR with CABGx3 from [facility]. He denies any depression or anxiety or stressors in his life. He does report trouble staying alseep due to frequent urination which has been a chronic problem for him for several years. He has tried sleepaides in the past but says he does not use anything currently. He lives alone. He has a son that lives in [place] whom he says plans to move near him soon. He is hopeful this will happen. He says his sister is his main support person. She lives in the same apartment complex and checks on him everyday. She also helps him with his medications. He did not know what medications he was taking. He said he would bring a list with him to his orientation visit. He completed the 8th grade and says he is able to read some but not a lot. He said his sister would help him  complete the paperwork for the program but he has not recieved the paperwork yet. His goals for the program are to get better overall; improve his strength, stamina and energy and get back to normal. He has no barriers identified to complete CR.  Expected Outcomes Short Term: start the program and attend consistently. Long Term: meet his personal goals.    Continue Psychosocial Services  Follow up required by staff             Psychosocial Re-Evaluation:  Psychosocial Re-Evaluation     Row Name 08/12/23 0804             Psychosocial Re-Evaluation   Current issues with Current Sleep Concerns       Comments Cody Shepard is doing well in rehab.  He tries not to stress over things and just gives them to God.  He prays throughout the day to help.  He is still not sleeping well, but sleeps when he can and he feels like he is doing well.       Expected Outcomes Short: Exercise for mental boost and help with sleep Long: Conitnue to stay positive       Interventions Encouraged to attend Cardiac Rehabilitation for the exercise       Continue Psychosocial Services  Follow up required by staff                Psychosocial Discharge (Final Psychosocial Re-Evaluation):  Psychosocial Re-Evaluation - 08/12/23 0804       Psychosocial Re-Evaluation   Current issues with Current Sleep Concerns    Comments Cody Shepard is doing well in rehab.  He tries not to stress over things and just gives them to God.  He prays throughout the day to help.  He is still not sleeping well, but sleeps when he can and he feels like he is doing well.    Expected Outcomes Short: Exercise for mental boost and help with sleep Long: Conitnue to stay positive    Interventions Encouraged to attend Cardiac Rehabilitation for the exercise    Continue Psychosocial Services  Follow up required by staff             Vocational Rehabilitation: Provide vocational rehab assistance to qualifying candidates.   Vocational Rehab  Evaluation & Intervention:  Vocational Rehab - 07/08/23 0936       Initial Vocational Rehab Evaluation & Intervention   Assessment shows need for Vocational Rehabilitation No      Vocational Rehab Re-Evaulation   Comments Patient is disabled and does not need vocational rehab.             Education: Education Goals: Education classes will be provided on a weekly basis, covering required topics. Participant will state understanding/return demonstration of topics presented.  Learning Barriers/Preferences:  Learning Barriers/Preferences - 07/08/23 0936       Learning Barriers/Preferences   Learning Barriers None    Learning Preferences Skilled Demonstration;Audio             Education Topics: Hypertension, Hypertension Reduction -Define heart disease and high blood pressure. Discus how high blood pressure affects the body and ways to reduce high blood pressure.   Exercise and Your Heart -Discuss why it is important to exercise, the FITT principles of exercise, normal and abnormal responses to exercise, and how to exercise safely. Flowsheet Row CARDIAC REHAB PHASE II EXERCISE from 08/31/2023 in [place] CARDIAC REHABILITATION  Date 07/20/23  Educator [facility]  Instruction Review Code 1- Verbalizes Understanding       Angina -Discuss definition of angina, causes of angina, treatment of angina, and how to decrease risk of having angina.   Cardiac Medications -Review what the following cardiac medications are used for, how they affect the body, and side effects  that may occur when taking the medications.  Medications include Aspirin, Beta blockers, calcium channel blockers, ACE Inhibitors, angiotensin receptor blockers, diuretics, digoxin, and antihyperlipidemics. Flowsheet Row CARDIAC REHAB PHASE II EXERCISE from 08/31/2023 in [place] CARDIAC REHABILITATION  Date 08/31/23  Educator [facility]  Instruction Review Code 1- Verbalizes Understanding       Congestive Heart  Failure -Discuss the definition of CHF, how to live with CHF, the signs and symptoms of CHF, and how keep track of weight and sodium intake.   Heart Disease and Intimacy -Discus the effect sexual activity has on the heart, how changes occur during intimacy as we age, and safety during sexual activity. Flowsheet Row CARDIAC REHAB PHASE II EXERCISE from 08/31/2023 in [place] CARDIAC REHABILITATION  Date 07/27/23  Educator [facility]  Instruction Review Code 1- Verbalizes Understanding       Smoking Cessation / COPD -Discuss different methods to quit smoking, the health benefits of quitting smoking, and the definition of COPD.   Nutrition I: Fats -Discuss the types of cholesterol, what cholesterol does to the heart, and how cholesterol levels can be controlled. Flowsheet Row CARDIAC REHAB PHASE II EXERCISE from 08/31/2023 in [place] CARDIAC REHABILITATION  Date 08/10/23  Educator [facility]  Instruction Review Code 1- Verbalizes Understanding       Nutrition II: Labels -Discuss the different components of food labels and how to read food label Flowsheet Row CARDIAC REHAB PHASE II EXERCISE from 08/31/2023 in [place] CARDIAC REHABILITATION  Date 08/10/23  Educator [facility]  Instruction Review Code 1- Verbalizes Understanding       Heart Parts/Heart Disease and PAD -Discuss the anatomy of the heart, the pathway of blood circulation through the heart, and these are affected by heart disease.   Stress I: Signs and Symptoms -Discuss the causes of stress, how stress may lead to anxiety and depression, and ways to limit stress.   Stress II: Relaxation -Discuss different types of relaxation techniques to limit stress.   Warning Signs of Stroke / TIA -Discuss definition of a stroke, what the signs and symptoms are of a stroke, and how to identify when someone is having stroke.   Knowledge Questionnaire Score:  Knowledge Questionnaire Score - 07/13/23 0855       Knowledge Questionnaire  Score   Pre Score 15/24             Core Components/Risk Factors/Patient Goals at Admission:  Personal Goals and Risk Factors at Admission - 07/08/23 0938       Core Components/Risk Factors/Patient Goals on Admission    Weight Management Weight Maintenance    Improve shortness of breath with ADL's Yes    Intervention Provide education, individualized exercise plan and daily activity instruction to help decrease symptoms of SOB with activities of daily living.    Expected Outcomes Short Term: Improve cardiorespiratory fitness to achieve a reduction of symptoms when performing ADLs;Long Term: Be able to perform more ADLs without symptoms or delay the onset of symptoms    Diabetes Yes    Intervention Provide education about signs/symptoms and action to take for hypo/hyperglycemia.;Provide education about proper nutrition, including hydration, and aerobic/resistive exercise prescription along with prescribed medications to achieve blood glucose in normal ranges: Fasting glucose 65-99 mg/dL    Expected Outcomes Short Term: Participant verbalizes understanding of the signs/symptoms and immediate care of hyper/hypoglycemia, proper foot care and importance of medication, aerobic/resistive exercise and nutrition plan for blood glucose control.;Long Term: Attainment of HbA1C < 7%.  Hypertension Yes    Intervention Monitor prescription use compliance.;Provide education on lifestyle modifcations including regular physical activity/exercise, weight management, moderate sodium restriction and increased consumption of fresh fruit, vegetables, and low fat dairy, alcohol moderation, and smoking cessation.    Expected Outcomes Long Term: Maintenance of blood pressure at goal levels.;Short Term: Continued assessment and intervention until BP is < 140/38mm HG in hypertensive participants. < 130/58mm HG in hypertensive participants with diabetes, heart failure or chronic kidney disease.    Lipids Yes     Intervention Provide education and support for participant on nutrition & aerobic/resistive exercise along with prescribed medications to achieve LDL 70mg , HDL >40mg .    Expected Outcomes Short Term: Participant states understanding of desired cholesterol values and is compliant with medications prescribed. Participant is following exercise prescription and nutrition guidelines.;Long Term: Cholesterol controlled with medications as prescribed, with individualized exercise RX and with personalized nutrition plan. Value goals: LDL < 70mg , HDL > 40 mg.             Core Components/Risk Factors/Patient Goals Review:   Goals and Risk Factor Review     Row Name 08/12/23 0808             Core Components/Risk Factors/Patient Goals Review   Personal Goals Review Weight Management/Obesity;Diabetes;Improve shortness of breath with ADL's;Hypertension;Lipids       Review Cody Shepard is doing well.  His weight is holding steady.  His blood sugars are doing well and usually around 110-120 mg/dl in the mornings.  His pressure are doing well.  He had good readings at the doctor's office this week.  He does check them both at home daily.  He is doing better with his breathing and feels like he has been able to do more at home than before.  He is usually able to push a couple of days and then take a day of rest and then go again.       Expected Outcomes Short: Conitnue to keep eye on sugars and pressures Long: Conitnue to monitor risk factors.                Core Components/Risk Factors/Patient Goals at Discharge (Final Review):   Goals and Risk Factor Review - 08/12/23 0808       Core Components/Risk Factors/Patient Goals Review   Personal Goals Review Weight Management/Obesity;Diabetes;Improve shortness of breath with ADL's;Hypertension;Lipids    Review Cody Shepard is doing well.  His weight is holding steady.  His blood sugars are doing well and usually around 110-120 mg/dl in the mornings.  His pressure are  doing well.  He had good readings at the doctor's office this week.  He does check them both at home daily.  He is doing better with his breathing and feels like he has been able to do more at home than before.  He is usually able to push a couple of days and then take a day of rest and then go again.    Expected Outcomes Short: Conitnue to keep eye on sugars and pressures Long: Conitnue to monitor risk factors.             ITP Comments:  ITP Comments     Row Name 07/13/23 1059 07/15/23 0803 08/03/23 0644 08/31/23 0648 09/12/23 0832   ITP Comments Patient arrived for 1st visit/orientation/education at 0800. 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 During orientation advised patient on arrival and appointment times what to wear, what to do before,  during and after exercise. Reviewed attendance and class policy.  Pt is scheduled to return Cardiac Rehab on Friday 07/15/23 at 0800. Pt was advised to come to class 15 minutes before class starts.  Discussed RPE/Dpysnea scales. Patient participated in warm up stretches. Patient was able to complete 6 minute walk test.  Telemetry:NSR. Patient was measured for the equipment. Discussed equipment safety with patient. Took patient pre-anthropometric measurements. Patient finished visit at 0900. First full day of exercise!  Patient was oriented to gym and equipment including functions, settings, policies, and procedures.  Patient's individual exercise prescription and treatment plan were reviewed.  All starting workloads were established based on the results of the 6 minute walk test done at initial orientation visit.  The plan for exercise progression was also introduced and progression will be customized based on patient's performance and goals. 30 day review completed. ITP sent to Dr. Dina Rich, Medical Director of Cardiac Rehab. Continue with ITP unless changes are made by physician. 30 day review completed. ITP sent to Dr.  Dina Rich, Medical Director of Cardiac Rehab. Continue with ITP unless changes are made by physician. Cody Shepard called out today again.  His back was bothering him and he has an appointment tomorrow for his neck.  He will be out for the week and hopes to return next week.    Row Name 09/26/23 0954           ITP Comments Pt called out and was in ED with back again. He felt this was on going problem and unsure of when he would be able to return.  Discussed options with pt and felt it was best to discharge at this time.                Comments: Discharge ITP

## 2023-09-27 NOTE — Progress Notes (Signed)
Discharge Progress Report  Patient Details  Name: Cody Shepard MRN: [id number] Date of Birth: Mar 14, 1966 Referring Provider:   Flowsheet Row CARDIAC REHAB PHASE II ORIENTATION from 07/13/2023 in [facility] CARDIAC REHABILITATION  Referring Provider Dr. Mayford Knife        Number of Visits: 19  Reason for Discharge:  Early Exit:  Personal and Back Pain  Smoking History:  Social History   Tobacco Use  Smoking Status Former   Current packs/day: 0.00   Types: Cigarettes   Quit date: 08/30/1997   Years since quitting: 26.0  Smokeless Tobacco Never    Diagnosis:  S/P CABG x 3  Initial Exercise Prescription:  Initial Exercise Prescription - 07/13/23 0900       Date of Initial Exercise RX and Referring Provider   Date 07/13/23    Referring Provider Dr. Mayford Knife      Oxygen   Maintain Oxygen Saturation 88% or higher      Treadmill   MPH 2    Grade 0    Minutes 15      Elliptical   Level 2    Speed 50    Minutes 15      Prescription Details   Frequency (times per week) 3    Duration Progress to 30 minutes of continuous aerobic without signs/symptoms of physical distress      Intensity   THRR 40-80% of Max Heartrate 114-145    Ratings of Perceived Exertion 11-13    Perceived Dyspnea 0-4      Resistance Training   Training Prescription Yes    Weight 4    Reps 10-15             Discharge Exercise Prescription (Final Exercise Prescription Changes):  Exercise Prescription Changes - 08/29/23 1200       Response to Exercise   Blood Pressure (Admit) 106/60    Blood Pressure (Exit) 90/60    Heart Rate (Admit) 78 bpm    Heart Rate (Exercise) 146 bpm    Heart Rate (Exit) 107 bpm    Rating of Perceived Exertion (Exercise) 12    Duration Continue with 30 min of aerobic exercise without signs/symptoms of physical distress.    Intensity THRR unchanged      Progression   Progression Continue to progress workloads to maintain intensity without signs/symptoms of  physical distress.      Resistance Training   Training Prescription Yes    Weight 5lbs    Reps 10-15      Elliptical   Level 4    Speed 44    Minutes 30    METs 2.9      Home Exercise Plan   Plans to continue exercise at Home (comment)    Frequency Add 2 additional days to program exercise sessions.      Oxygen   Maintain Oxygen Saturation 88% or higher             Functional Capacity:  6 Minute Walk     Row Name 07/13/23 0913         6 Minute Walk   Phase Initial     Distance 1330 feet     Walk Time 6 minutes     # of Rest Breaks 0     MPH 2.51     METS 3.32     RPE 11     VO2 Peak 11.64     Symptoms No     Resting HR 83 bpm  Resting BP 126/84     Resting Oxygen Saturation  99 %     Exercise Oxygen Saturation  during 6 min walk 98 %     Max Ex. HR 95 bpm     Max Ex. BP 136/86     2 Minute Post BP 124/80              Psychological, QOL, Others - Outcomes: PHQ 2/9:    07/13/2023    8:56 AM 10/02/2018   10:42 AM  Depression screen PHQ 2/9  Decreased Interest 0 0  Down, Depressed, Hopeless 0 0  PHQ - 2 Score 0 0  Altered sleeping 0   Tired, decreased energy 0   Change in appetite 0   Feeling bad or failure about yourself  0   Trouble concentrating 0   Moving slowly or fidgety/restless 0   Suicidal thoughts 0   PHQ-9 Score 0     Quality of Life:  Quality of Life - 07/13/23 1057       Quality of Life   Select Quality of Life      Quality of Life Scores   Health/Function Pre 28.43 %    Socioeconomic Pre 30 %    Psych/Spiritual Pre 30 %    Family Pre 24 %    GLOBAL Pre 28.38 %            Nutrition & Weight - Outcomes:  Pre Biometrics - 07/13/23 0917       Pre Biometrics   Height 5\' 6"  (1.676 m)    Weight 191 lb 12.8 oz (87 kg)    Waist Circumference 42 inches    Hip Circumference 41 inches    Waist to Hip Ratio 1.02 %    BMI (Calculated) 30.97    Grip Strength 28.9 kg    Single Leg Stand 31.35 seconds

## 2023-09-28 ENCOUNTER — Encounter (HOSPITAL_COMMUNITY): Payer: Medicare Other

## 2023-09-30 ENCOUNTER — Encounter (HOSPITAL_COMMUNITY): Payer: Medicare Other

## 2023-10-03 ENCOUNTER — Encounter (HOSPITAL_COMMUNITY): Payer: Medicare Other

## 2023-10-10 ENCOUNTER — Other Ambulatory Visit: Payer: Self-pay

## 2023-10-10 ENCOUNTER — Emergency Department (HOSPITAL_COMMUNITY)
Admission: EM | Admit: 2023-10-10 | Discharge: 2023-10-11 | Disposition: A | Discharge status: Home / Self Care | Payer: Medicare Other | Attending: Emergency Medicine | Admitting: Emergency Medicine

## 2023-10-10 DIAGNOSIS — I2510 Atherosclerotic heart disease of native coronary artery without angina pectoris: Secondary | ICD-10-CM | POA: Insufficient documentation

## 2023-10-10 DIAGNOSIS — M545 Low back pain, unspecified: Secondary | ICD-10-CM | POA: Diagnosis present

## 2023-10-10 DIAGNOSIS — Z7901 Long term (current) use of anticoagulants: Secondary | ICD-10-CM | POA: Diagnosis not present

## 2023-10-10 MED ORDER — KETOROLAC TROMETHAMINE 30 MG/ML IJ SOLN
30.0000 mg | Freq: Once | INTRAMUSCULAR | Status: AC
  Administered 2023-10-10: 30 mg via INTRAMUSCULAR
  Filled 2023-10-10: qty 1

## 2023-10-10 MED ORDER — HYDROCODONE-ACETAMINOPHEN 5-325 MG PO TABS
1.0000 | ORAL_TABLET | Freq: Once | ORAL | Status: AC
  Administered 2023-10-10: 1 via ORAL
  Filled 2023-10-10: qty 1

## 2023-10-10 NOTE — ED Notes (Signed)
ED Provider at bedside. 

## 2023-10-10 NOTE — ED Provider Notes (Signed)
[place] EMERGENCY DEPARTMENT AT [facility]  Provider Note  CSN: [id number] Arrival date & time: 10/10/23 2005  History Chief Complaint  Patient presents with   Back Pain    Cody Shepard is a 57 y.o. male with history of CAD s/p CABG this past summer brought to the ED via EMS for evaluation of diffuse lower back pain, ongoing for several weeks, worse with movement. States he was seen in the ED in [place] x 2 for same and given a muscle relaxer he is not sure the name of (triage not indicates Robaxin) without much improvement. No falls, no fever, no incontinence of bowel or bladder.    Home Medications Prior to Admission medications   Medication Sig Start Date End Date Taking? Authorizing Provider  HYDROcodone-acetaminophen (NORCO/VICODIN) 5-325 MG tablet Take 1 tablet by mouth every 6 (six) hours as needed for severe pain (pain score 7-10). 10/11/23  Yes Pollyann Savoy, MD  albuterol (PROVENTIL HFA;VENTOLIN HFA) 108 (90 BASE) MCG/ACT inhaler Inhale 2 puffs into the lungs every 4 (four) hours as needed for wheezing or shortness of breath.  Patient not taking: Reported on 07/15/2023    [provider]  alfuzosin (UROXATRAL) 10 MG 24 hr tablet Take 1 tablet (10 mg total) by mouth at bedtime. Patient not taking: Reported on 07/15/2023 02/15/23   Malen Gauze, MD  AMBULATORY NON FORMULARY MEDICATION 0.2 mLs by Intracavernosal route as needed. Medication Name: Trimix  PGE Pap 30mg  Phent 1mg  Patient not taking: Reported on 07/15/2023 02/15/23   Malen Gauze, MD  amLODipine (NORVASC) 2.5 MG tablet Take 2.5 mg by mouth daily. 05/11/23 05/10/24  [provider]  atorvastatin (LIPITOR) 80 MG tablet Take 80 mg by mouth daily. 05/10/23   [provider]  carvedilol (COREG) 25 MG tablet Take 25 mg by mouth 2 (two) times daily with a meal. Breakfast & supper Patient not taking: Reported on 07/15/2023    [provider]  cloNIDine  (CATAPRES - DOSED IN MG/24 HR) 0.1 mg/24hr patch Place 0.1 mg onto the skin every Sunday. Patient not taking: Reported on 07/15/2023    [provider]  diclofenac Sodium (VOLTAREN) 1 % GEL Apply 1 g topically 4 (four) times daily as needed (pain.). Patient not taking: Reported on 07/15/2023    [provider]  diclofenac Sodium (VOLTAREN) 1 % GEL Apply 2 g topically in the morning, at noon, and at bedtime. Apply to back of your neck. 09/10/23   Gerhard Munch, MD  ELIQUIS 5 MG TABS tablet Take 5 mg by mouth every 12 (twelve) hours. 05/09/23   [provider]  fluticasone (FLONASE) 50 MCG/ACT nasal spray Place 2 sprays into both nostrils daily.  Patient not taking: Reported on 07/15/2023    [provider]  Fluticasone-Umeclidin-Vilant (TRELEGY ELLIPTA) 100-62.5-25 MCG/INH AEPB Inhale 1 Inhaler into the lungs daily. Patient not taking: Reported on 07/15/2023    [provider]  hydrALAZINE (APRESOLINE) 50 MG tablet Take 50 mg by mouth in the morning and at bedtime. Patient not taking: Reported on 07/15/2023 12/10/19   [provider]  isosorbide mononitrate (IMDUR) 60 MG 24 hr tablet Take 60 mg by mouth in the morning and at bedtime.  Patient not taking: Reported on 07/15/2023 11/06/19   [provider]  metFORMIN (GLUCOPHAGE) 500 MG tablet Take 500 mg by mouth 2 (two) times daily. Patient not taking: Reported on 07/15/2023 01/07/20   [provider]  metoprolol tartrate (LOPRESSOR)  25 MG tablet Take 25 mg by mouth 2 (two) times daily. 05/10/23   [provider]  nitroGLYCERIN (NITROSTAT) 0.4 MG SL tablet Place 0.4 mg under the tongue every 5 (five) minutes x 3 doses as needed for chest pain.    [provider]  olmesartan (BENICAR) 20 MG tablet Take 20 mg by mouth daily. Patient not taking: Reported on 07/15/2023 02/26/19   [provider]  ranolazine (RANEXA) 500 MG 12 hr tablet Take 500 mg by mouth 2 (two)  times daily. Patient not taking: Reported on 07/15/2023    [provider]  tamsulosin (FLOMAX) 0.4 MG CAPS capsule Take 0.4 mg by mouth daily. Patient not taking: Reported on 07/15/2023 05/10/23   [provider]  vitamin C (ASCORBIC ACID) 250 MG tablet Take 250 mg by mouth daily. Patient not taking: Reported on 07/15/2023    [provider]     Allergies    Patient has no known allergies.   Review of Systems   Review of Systems Please see HPI for pertinent positives and negatives  Physical Exam BP (!) 132/103   Pulse (!) 112   Temp 97.7 F (36.5 C) (Oral)   Resp 17   SpO2 99%   Physical Exam Vitals and nursing note reviewed.  HENT:     Head: Normocephalic.     Nose: Nose normal.  Eyes:     Extraocular Movements: Extraocular movements intact.  Pulmonary:     Effort: Pulmonary effort is normal.  Musculoskeletal:        General: Tenderness (diffuse lumbar area) present. Normal range of motion.     Cervical back: Neck supple.  Skin:    Findings: No rash (on exposed skin).  Neurological:     Mental Status: He is alert and oriented to person, place, and time.     Cranial Nerves: No cranial nerve deficit.     Sensory: No sensory deficit.     Motor: No weakness.     Deep Tendon Reflexes: Reflexes normal.     Comments: Negative bilateral straight leg raise  Psychiatric:        Mood and Affect: Mood normal.     ED Results / Procedures / Treatments   EKG None  Procedures Procedures  Medications Ordered in the ED Medications  HYDROcodone-acetaminophen (NORCO/VICODIN) 5-325 MG per tablet 1 tablet (1 tablet Oral Given 10/10/23 2344)  ketorolac (TORADOL) 30 MG/ML injection 30 mg (30 mg Intramuscular Given 10/10/23 2344)    Initial Impression and Plan  Patient here with MSK back pain, ongoing for several weeks, no Red Flags. Will give IM toradol and PO norco for pain control. He will try to find out what medications he has been taking at home.    ED Course   Clinical Course as of 10/11/23 0032  Tue Oct 11, 2023  0030 Patient sleeping soundly on re-evaluation. Plan discharge with Rx for Norco (he reports his previous Rx was a steroid taper) and recommend outpatient follow up for long term management. [CS]    Clinical Course User Index [CS] Pollyann Savoy, MD     MDM Rules/Calculators/A&P Medical Decision Making Problems Addressed: Acute bilateral low back pain without sciatica: acute illness or injury  Risk Prescription drug management.     Final Clinical Impression(s) / ED Diagnoses Final diagnoses:  Acute bilateral low back pain without sciatica    Rx / DC Orders ED Discharge Orders          Ordered  HYDROcodone-acetaminophen (NORCO/VICODIN) 5-325 MG tablet  Every 6 hours PRN        10/11/23 0032             Pollyann Savoy, MD 10/11/23 [phone]

## 2023-10-10 NOTE — ED Triage Notes (Signed)
Pt bib [facility] c/o lower back pain x3 weeks. Per Ems pt has been seen at other facilities and has been prescribed Robaxin but has not had any pain relief. Pt states he is having trouble walking d/t pain. Denies any recent injury

## 2023-10-11 MED ORDER — HYDROCODONE-ACETAMINOPHEN 5-325 MG PO TABS
1.0000 | ORAL_TABLET | Freq: Four times a day (QID) | ORAL | 0 refills | Status: AC | PRN
Start: 2023-10-11 — End: ?

## 2023-10-23 DIAGNOSIS — I4892 Unspecified atrial flutter: Secondary | ICD-10-CM | POA: Insufficient documentation

## 2023-10-31 DIAGNOSIS — I5020 Unspecified systolic (congestive) heart failure: Secondary | ICD-10-CM | POA: Insufficient documentation

## 2023-11-30 ENCOUNTER — Ambulatory Visit: Payer: Medicare Other | Admitting: Urology

## 2023-12-19 ENCOUNTER — Encounter (INDEPENDENT_AMBULATORY_CARE_PROVIDER_SITE_OTHER): Payer: Self-pay | Admitting: *Deleted

## 2024-02-01 ENCOUNTER — Ambulatory Visit: Payer: Medicare Other | Admitting: Urology

## 2024-09-21 ENCOUNTER — Other Ambulatory Visit: Payer: Self-pay | Admitting: Urology

## 2024-10-01 ENCOUNTER — Other Ambulatory Visit: Payer: Self-pay | Admitting: *Deleted

## 2024-10-01 DIAGNOSIS — K4090 Unilateral inguinal hernia, without obstruction or gangrene, not specified as recurrent: Secondary | ICD-10-CM

## 2024-10-02 ENCOUNTER — Ambulatory Visit: Admitting: General Surgery

## 2024-11-13 ENCOUNTER — Other Ambulatory Visit: Payer: Self-pay

## 2024-11-13 ENCOUNTER — Ambulatory Visit: Admitting: General Surgery

## 2024-11-13 ENCOUNTER — Encounter: Payer: Self-pay | Admitting: General Surgery

## 2024-11-13 VITALS — BP 134/88 | HR 76 | Temp 98.3°F | Resp 18 | Ht 70.0 in | Wt 204.0 lb

## 2024-11-13 DIAGNOSIS — K4090 Unilateral inguinal hernia, without obstruction or gangrene, not specified as recurrent: Secondary | ICD-10-CM | POA: Diagnosis not present

## 2024-11-13 NOTE — Progress Notes (Signed)
 Cody Shepard; [id number]; 06-04-1966   HPI Patient is a 59 year old black male who was referred to my care by Dr. Katrinka for evaluation and treatment of a right inguinal hernia.  Patient states he has had a right inguinal hernia for approximately 6 months.  It currently is not bothering him.  He denies any right groin pain.  He denies any nausea or vomiting.  He has never had an episode of incarceration.  I fixed his left inguinal hernia back in 2021.  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  Patient is listed as taking Eliquis, but he does not remember whether he is taking it or not. Past Medical History:  Diagnosis Date   Asthma    Bronchitis    COPD (chronic obstructive pulmonary disease) (HCC)    Coronary artery disease    Diabetes mellitus without complication (HCC)    Headache    High cholesterol    Hypertension     Past Surgical History:  Procedure Laterality Date   INGUINAL HERNIA REPAIR Left 01/16/2020   Procedure: HERNIA REPAIR INGUINAL ADULT;  Surgeon: Mavis Anes, MD;  Location: [facility];  Service: General;  Laterality: Left;   LAPAROSCOPIC ADRENALECTOMY Left 01/04/2019   Procedure: LAPAROSCOPIC LEFT ADRENALECTOMY;  Surgeon: Eletha Boas, MD;  Location: [facility];  Service: General;  Laterality: Left;    Family History  Problem Relation Age of Onset   Diabetes Mother    Hypertension Mother    Stroke Father     Medications Ordered Prior to Encounter[1]  Allergies[2]  Social History   Substance and Sexual Activity  Alcohol Use No    Tobacco Use History[3]  Review of Systems  Constitutional:  Positive for malaise/fatigue.  HENT: Negative.    Eyes: Negative.   Respiratory:  Positive for shortness of breath.   Cardiovascular: Negative.   Gastrointestinal: Negative.   Genitourinary: Negative.   Musculoskeletal:  Positive for joint pain.  Skin: Negative.   Neurological: Negative.   Endo/Heme/Allergies:   Bruises/bleeds easily.  Psychiatric/Behavioral: Negative.      Objective   Vitals:   11/13/24 0953  BP: 134/88  Pulse: 76  Resp: 18  Temp: 98.3 F (36.8 C)  SpO2: 95%    Physical Exam Vitals reviewed.  Constitutional:      Appearance: Normal appearance. He is normal weight. He is not ill-appearing.  HENT:     Head: Normocephalic and atraumatic.  Cardiovascular:     Rate and Rhythm: Normal rate and regular rhythm.     Heart sounds: Normal heart sounds. No murmur heard.    No friction rub. No gallop.  Pulmonary:     Effort: No respiratory distress.     Breath sounds: No stridor. Wheezing present. No rhonchi or rales.  Abdominal:     General: Bowel sounds are normal. There is no distension.     Palpations: Abdomen is soft. There is no mass.     Tenderness: There is no abdominal tenderness. There is no guarding or rebound.     Hernia: A hernia is present.     Comments: Easily reducible right inguinal hernia.  Genitourinary:    Testes: Normal.  Skin:    General: Skin is warm and dry.  Neurological:     Mental Status: He is alert and oriented to person, place, and time.    Previous notes from cardiac rehab and [facility] reviewed Assessment  Right inguinal hernia Multiple comorbidities including coronary artery  disease, chronic Eliquis use, COPD Plan  As he is asymptomatic from his right inguinal hernia, no need for surgical intervention at the present time.  Patient understands and agrees.  Literature was given concerning inguinal hernias.  He is to return to my care should he develop symptoms.  Follow-up here as needed.    [1]  Current Outpatient Medications on File Prior to Visit  Medication Sig Dispense Refill   albuterol  (PROVENTIL  HFA;VENTOLIN  HFA) 108 (90 BASE) MCG/ACT inhaler Inhale 2 puffs into the lungs every 4 (four) hours as needed for wheezing or shortness of breath.  (Patient not taking: Reported on 07/15/2023)     alfuzosin  (UROXATRAL ) 10 MG 24 hr tablet TAKE  ONE TABLET BY MOUTH AT BEDTIME 30 tablet 11   atorvastatin  (LIPITOR) 80 MG tablet Take 80 mg by mouth daily.     carvedilol  (COREG ) 25 MG tablet Take 25 mg by mouth 2 (two) times daily with a meal. Breakfast & supper (Patient not taking: Reported on 11/13/2024)     cloNIDine  (CATAPRES  - DOSED IN MG/24 HR) 0.1 mg/24hr patch Place 0.1 mg onto the skin every Sunday. (Patient not taking: Reported on 11/13/2024)     diclofenac  Sodium (VOLTAREN ) 1 % GEL Apply 1 g topically 4 (four) times daily as needed (pain.). (Patient not taking: Reported on 11/13/2024)     diclofenac  Sodium (VOLTAREN ) 1 % GEL Apply 2 g topically in the morning, at noon, and at bedtime. Apply to back of your neck. 100 g 0   ELIQUIS 5 MG TABS tablet Take 5 mg by mouth every 12 (twelve) hours.     fluticasone  (FLONASE ) 50 MCG/ACT nasal spray Place 2 sprays into both nostrils daily.  (Patient not taking: Reported on 07/15/2023)     Fluticasone -Umeclidin-Vilant (TRELEGY ELLIPTA) 100-62.5-25 MCG/INH AEPB Inhale 1 Inhaler into the lungs daily. (Patient not taking: Reported on 07/15/2023)     hydrALAZINE  (APRESOLINE ) 50 MG tablet Take 50 mg by mouth in the morning and at bedtime. (Patient not taking: Reported on 07/15/2023)     HYDROcodone -acetaminophen  (NORCO/VICODIN) 5-325 MG tablet Take 1 tablet by mouth every 6 (six) hours as needed for severe pain (pain score 7-10). 12 tablet 0   isosorbide  mononitrate (IMDUR ) 60 MG 24 hr tablet Take 60 mg by mouth in the morning and at bedtime.  (Patient not taking: Reported on 07/15/2023)     metFORMIN  (GLUCOPHAGE ) 500 MG tablet Take 500 mg by mouth 2 (two) times daily. (Patient not taking: Reported on 07/15/2023)     metoprolol tartrate (LOPRESSOR) 25 MG tablet Take 25 mg by mouth 2 (two) times daily.     nitroGLYCERIN (NITROSTAT) 0.4 MG SL tablet Place 0.4 mg under the tongue every 5 (five) minutes x 3 doses as needed for chest pain.     olmesartan (BENICAR) 20 MG tablet Take 20 mg by mouth daily. (Patient not  taking: Reported on 07/15/2023)     ranolazine  (RANEXA ) 500 MG 12 hr tablet Take 500 mg by mouth 2 (two) times daily. (Patient not taking: Reported on 07/15/2023)     tamsulosin (FLOMAX) 0.4 MG CAPS capsule Take 0.4 mg by mouth daily. (Patient not taking: Reported on 07/15/2023)     vitamin C (ASCORBIC ACID) 250 MG tablet Take 250 mg by mouth daily. (Patient not taking: Reported on 07/15/2023)     No current facility-administered medications on file prior to visit.  [2] No Known Allergies [3]  Social History Tobacco Use  Smoking Status Former   Current  packs/day: 0.00   Average packs/day: 1.0 packs/day   Types: Cigarettes   Quit date: 08/30/1997   Years since quitting: 27.2  Smokeless Tobacco Never
